# Patient Record
Sex: Female | Born: 1970
Health system: Southern US, Community
[De-identification: ages and names within clinical notes are randomized; demographics above are authoritative.]

## PROBLEM LIST (undated history)

## (undated) DIAGNOSIS — H9190 Unspecified hearing loss, unspecified ear: Secondary | ICD-10-CM

## (undated) DIAGNOSIS — D473 Essential (hemorrhagic) thrombocythemia: Secondary | ICD-10-CM

## (undated) DIAGNOSIS — R32 Unspecified urinary incontinence: Secondary | ICD-10-CM

## (undated) DIAGNOSIS — K5900 Constipation, unspecified: Secondary | ICD-10-CM

## (undated) DIAGNOSIS — K317 Polyp of stomach and duodenum: Secondary | ICD-10-CM

## (undated) DIAGNOSIS — I499 Cardiac arrhythmia, unspecified: Secondary | ICD-10-CM

## (undated) DIAGNOSIS — K219 Gastro-esophageal reflux disease without esophagitis: Secondary | ICD-10-CM

## (undated) DIAGNOSIS — N898 Other specified noninflammatory disorders of vagina: Secondary | ICD-10-CM

## (undated) DIAGNOSIS — R7989 Other specified abnormal findings of blood chemistry: Secondary | ICD-10-CM

## (undated) DIAGNOSIS — D75839 Thrombocytosis, unspecified: Secondary | ICD-10-CM

## (undated) DIAGNOSIS — J309 Allergic rhinitis, unspecified: Secondary | ICD-10-CM

## (undated) DIAGNOSIS — F909 Attention-deficit hyperactivity disorder, unspecified type: Secondary | ICD-10-CM

## (undated) DIAGNOSIS — I471 Supraventricular tachycardia, unspecified: Secondary | ICD-10-CM

## (undated) DIAGNOSIS — K6289 Other specified diseases of anus and rectum: Secondary | ICD-10-CM

## (undated) DIAGNOSIS — E0590 Thyrotoxicosis, unspecified without thyrotoxic crisis or storm: Secondary | ICD-10-CM

## (undated) DIAGNOSIS — R5382 Chronic fatigue, unspecified: Secondary | ICD-10-CM

## (undated) DIAGNOSIS — K589 Irritable bowel syndrome without diarrhea: Secondary | ICD-10-CM

## (undated) DIAGNOSIS — R112 Nausea with vomiting, unspecified: Secondary | ICD-10-CM

## (undated) DIAGNOSIS — N83201 Unspecified ovarian cyst, right side: Secondary | ICD-10-CM

## (undated) DIAGNOSIS — R109 Unspecified abdominal pain: Secondary | ICD-10-CM

## (undated) DIAGNOSIS — R51 Headache: Secondary | ICD-10-CM

## (undated) DIAGNOSIS — D126 Benign neoplasm of colon, unspecified: Secondary | ICD-10-CM

## (undated) DIAGNOSIS — K449 Diaphragmatic hernia without obstruction or gangrene: Secondary | ICD-10-CM

## (undated) DIAGNOSIS — Z9889 Other specified postprocedural states: Secondary | ICD-10-CM

## (undated) DIAGNOSIS — R000 Tachycardia, unspecified: Secondary | ICD-10-CM

## (undated) HISTORY — DX: Unspecified hearing loss, unspecified ear: H91.90

## (undated) HISTORY — DX: Other specified diseases of anus and rectum: K62.89

## (undated) HISTORY — PX: SHOULDER SURGERY: SHX246

## (undated) HISTORY — DX: Supraventricular tachycardia, unspecified: I47.10

## (undated) HISTORY — PX: OTHER SURGICAL HISTORY: SHX169

## (undated) HISTORY — DX: Unspecified urinary incontinence: R32

## (undated) HISTORY — PX: UPPER GASTROINTESTINAL ENDOSCOPY: SHX188

## (undated) HISTORY — DX: Essential (hemorrhagic) thrombocythemia: D47.3

## (undated) HISTORY — DX: Benign neoplasm of colon, unspecified: D12.6

## (undated) HISTORY — DX: Chronic fatigue, unspecified: R53.82

## (undated) HISTORY — PX: INCONTINENCE SURGERY: SHX676

## (undated) HISTORY — DX: Other specified noninflammatory disorders of vagina: N89.8

## (undated) HISTORY — DX: Constipation, unspecified: K59.00

## (undated) HISTORY — DX: Attention-deficit hyperactivity disorder, unspecified type: F90.9

## (undated) HISTORY — DX: Unspecified ovarian cyst, right side: N83.201

## (undated) HISTORY — DX: Polyp of stomach and duodenum: K31.7

## (undated) HISTORY — PX: NOSE SURGERY: SHX723

## (undated) HISTORY — DX: Supraventricular tachycardia: I47.1

## (undated) HISTORY — DX: Diaphragmatic hernia without obstruction or gangrene: K44.9

## (undated) HISTORY — PX: DIAGNOSTIC LAPAROSCOPY: SUR761

## (undated) HISTORY — DX: Irritable bowel syndrome, unspecified: K58.9

## (undated) HISTORY — PX: DILATION AND CURETTAGE OF UTERUS: SHX78

## (undated) HISTORY — DX: Unspecified abdominal pain: R10.9

## (undated) HISTORY — DX: Thrombocytosis, unspecified: D75.839

## (undated) HISTORY — DX: Allergic rhinitis, unspecified: J30.9

## (undated) HISTORY — DX: Other specified abnormal findings of blood chemistry: R79.89

## (undated) HISTORY — PX: ANTERIOR AND POSTERIOR REPAIR: SHX1172

## (undated) HISTORY — PX: WISDOM TOOTH EXTRACTION: SHX21

## (undated) SURGERY — Surgical Case
Anesthesia: *Unknown

---

## 1998-02-19 ENCOUNTER — Ambulatory Visit (HOSPITAL_BASED_OUTPATIENT_CLINIC_OR_DEPARTMENT_OTHER): Admission: RE | Admit: 1998-02-19 | Discharge: 1998-02-19 | Payer: Self-pay | Admitting: *Deleted

## 1998-08-28 ENCOUNTER — Encounter: Payer: Self-pay | Admitting: Gastroenterology

## 1998-08-28 ENCOUNTER — Ambulatory Visit (HOSPITAL_COMMUNITY): Admission: RE | Admit: 1998-08-28 | Discharge: 1998-08-28 | Payer: Self-pay | Admitting: Gastroenterology

## 1998-10-14 ENCOUNTER — Ambulatory Visit (HOSPITAL_COMMUNITY): Admission: RE | Admit: 1998-10-14 | Discharge: 1998-10-14 | Payer: Self-pay | Admitting: Gastroenterology

## 1999-06-13 ENCOUNTER — Encounter: Payer: Self-pay | Admitting: *Deleted

## 1999-06-13 ENCOUNTER — Encounter: Admission: RE | Admit: 1999-06-13 | Discharge: 1999-06-13 | Payer: Self-pay | Admitting: *Deleted

## 1999-10-22 ENCOUNTER — Encounter: Admission: RE | Admit: 1999-10-22 | Discharge: 2000-01-20 | Payer: Self-pay | Admitting: Neurology

## 1999-10-23 ENCOUNTER — Encounter: Admission: RE | Admit: 1999-10-23 | Discharge: 1999-10-23 | Payer: Self-pay | Admitting: Neurology

## 1999-10-23 ENCOUNTER — Encounter: Payer: Self-pay | Admitting: Neurology

## 2000-07-07 ENCOUNTER — Other Ambulatory Visit: Admission: RE | Admit: 2000-07-07 | Discharge: 2000-07-07 | Payer: Self-pay | Admitting: *Deleted

## 2001-09-06 ENCOUNTER — Other Ambulatory Visit: Admission: RE | Admit: 2001-09-06 | Discharge: 2001-09-06 | Payer: Self-pay | Admitting: *Deleted

## 2004-09-25 ENCOUNTER — Encounter: Admission: RE | Admit: 2004-09-25 | Discharge: 2004-09-25 | Payer: Self-pay | Admitting: General Surgery

## 2005-01-11 ENCOUNTER — Inpatient Hospital Stay (HOSPITAL_COMMUNITY): Admission: AD | Admit: 2005-01-11 | Discharge: 2005-01-11 | Payer: Self-pay | Admitting: Obstetrics and Gynecology

## 2005-07-18 ENCOUNTER — Inpatient Hospital Stay (HOSPITAL_COMMUNITY): Admission: AD | Admit: 2005-07-18 | Discharge: 2005-07-18 | Payer: Self-pay | Admitting: *Deleted

## 2005-07-18 ENCOUNTER — Encounter (INDEPENDENT_AMBULATORY_CARE_PROVIDER_SITE_OTHER): Payer: Self-pay | Admitting: Specialist

## 2005-10-30 ENCOUNTER — Inpatient Hospital Stay (HOSPITAL_COMMUNITY): Admission: AD | Admit: 2005-10-30 | Discharge: 2005-11-02 | Payer: Self-pay | Admitting: Obstetrics and Gynecology

## 2005-11-03 ENCOUNTER — Encounter: Admission: RE | Admit: 2005-11-03 | Discharge: 2005-12-03 | Payer: Self-pay | Admitting: Obstetrics and Gynecology

## 2006-01-26 ENCOUNTER — Encounter: Admission: RE | Admit: 2006-01-26 | Discharge: 2006-03-25 | Payer: Self-pay | Admitting: Obstetrics and Gynecology

## 2006-04-19 ENCOUNTER — Ambulatory Visit (HOSPITAL_COMMUNITY): Admission: RE | Admit: 2006-04-19 | Discharge: 2006-04-19 | Payer: Self-pay | Admitting: Neurology

## 2006-09-03 ENCOUNTER — Ambulatory Visit (HOSPITAL_COMMUNITY): Admission: RE | Admit: 2006-09-03 | Discharge: 2006-09-03 | Payer: Self-pay | Admitting: Neurology

## 2006-09-15 ENCOUNTER — Ambulatory Visit (HOSPITAL_COMMUNITY): Admission: RE | Admit: 2006-09-15 | Discharge: 2006-09-15 | Payer: Self-pay | Admitting: Neurology

## 2006-12-17 ENCOUNTER — Encounter: Payer: Self-pay | Admitting: Urology

## 2006-12-17 ENCOUNTER — Ambulatory Visit (HOSPITAL_COMMUNITY): Admission: RE | Admit: 2006-12-17 | Discharge: 2006-12-18 | Payer: Self-pay | Admitting: Urology

## 2007-05-17 ENCOUNTER — Encounter: Admission: RE | Admit: 2007-05-17 | Discharge: 2007-05-17 | Payer: Self-pay | Admitting: Orthopedic Surgery

## 2007-05-25 ENCOUNTER — Ambulatory Visit (HOSPITAL_COMMUNITY): Admission: RE | Admit: 2007-05-25 | Discharge: 2007-05-25 | Payer: Self-pay | Admitting: Otolaryngology

## 2007-05-27 ENCOUNTER — Ambulatory Visit (HOSPITAL_BASED_OUTPATIENT_CLINIC_OR_DEPARTMENT_OTHER): Admission: RE | Admit: 2007-05-27 | Discharge: 2007-05-27 | Payer: Self-pay | Admitting: Otolaryngology

## 2007-06-09 ENCOUNTER — Encounter: Admission: RE | Admit: 2007-06-09 | Discharge: 2007-06-09 | Payer: Self-pay | Admitting: Otolaryngology

## 2007-08-26 ENCOUNTER — Encounter: Admission: RE | Admit: 2007-08-26 | Discharge: 2007-08-26 | Payer: Self-pay | Admitting: Surgery

## 2007-08-30 ENCOUNTER — Encounter: Admission: RE | Admit: 2007-08-30 | Discharge: 2007-08-30 | Payer: Self-pay | Admitting: Otolaryngology

## 2007-09-10 ENCOUNTER — Ambulatory Visit (HOSPITAL_COMMUNITY): Admission: RE | Admit: 2007-09-10 | Discharge: 2007-09-10 | Payer: Self-pay | Admitting: Neurology

## 2008-02-29 ENCOUNTER — Encounter: Admission: RE | Admit: 2008-02-29 | Discharge: 2008-05-22 | Payer: Self-pay | Admitting: Orthopedic Surgery

## 2008-09-16 ENCOUNTER — Ambulatory Visit (HOSPITAL_COMMUNITY): Admission: RE | Admit: 2008-09-16 | Discharge: 2008-09-16 | Payer: Self-pay | Admitting: Ophthalmology

## 2009-08-19 ENCOUNTER — Ambulatory Visit (HOSPITAL_COMMUNITY): Admission: RE | Admit: 2009-08-19 | Discharge: 2009-08-19 | Payer: Self-pay | Admitting: Gastroenterology

## 2009-08-21 ENCOUNTER — Encounter: Admission: RE | Admit: 2009-08-21 | Discharge: 2009-08-21 | Payer: Self-pay | Admitting: Gastroenterology

## 2010-04-03 ENCOUNTER — Encounter: Admission: RE | Admit: 2010-04-03 | Discharge: 2010-04-03 | Payer: Self-pay | Admitting: Otolaryngology

## 2010-08-16 ENCOUNTER — Encounter: Payer: Self-pay | Admitting: Surgery

## 2010-08-17 ENCOUNTER — Encounter: Payer: Self-pay | Admitting: Neurology

## 2010-09-25 ENCOUNTER — Other Ambulatory Visit (HOSPITAL_COMMUNITY): Payer: Self-pay | Admitting: Sports Medicine

## 2010-09-25 DIAGNOSIS — M25512 Pain in left shoulder: Secondary | ICD-10-CM

## 2010-10-02 ENCOUNTER — Ambulatory Visit (HOSPITAL_COMMUNITY)
Admission: RE | Admit: 2010-10-02 | Discharge: 2010-10-02 | Disposition: A | Discharge status: Home / Self Care | Payer: Commercial Managed Care - PPO | Source: Ambulatory Visit | Attending: Sports Medicine | Admitting: Sports Medicine

## 2010-10-02 ENCOUNTER — Other Ambulatory Visit (HOSPITAL_COMMUNITY): Payer: Self-pay

## 2010-10-02 ENCOUNTER — Other Ambulatory Visit (HOSPITAL_COMMUNITY): Payer: Self-pay | Admitting: Sports Medicine

## 2010-10-02 DIAGNOSIS — S43439A Superior glenoid labrum lesion of unspecified shoulder, initial encounter: Secondary | ICD-10-CM

## 2010-10-02 DIAGNOSIS — M25519 Pain in unspecified shoulder: Secondary | ICD-10-CM | POA: Insufficient documentation

## 2010-10-02 DIAGNOSIS — M25512 Pain in left shoulder: Secondary | ICD-10-CM

## 2010-10-02 MED ORDER — IOHEXOL 300 MG/ML  SOLN
12.0000 mL | Freq: Once | INTRAMUSCULAR | Status: AC | PRN
  Administered 2010-10-02: 12 mL via INTRAVENOUS

## 2010-10-02 MED ORDER — GADOBENATE DIMEGLUMINE 529 MG/ML IV SOLN
0.1000 mL | Freq: Once | INTRAVENOUS | Status: AC | PRN
  Administered 2010-10-02: 0.1 mL via INTRAVENOUS

## 2010-10-23 ENCOUNTER — Ambulatory Visit (HOSPITAL_BASED_OUTPATIENT_CLINIC_OR_DEPARTMENT_OTHER)
Admission: RE | Admit: 2010-10-23 | Discharge: 2010-10-23 | Disposition: A | Discharge status: Home / Self Care | Payer: 59 | Source: Ambulatory Visit | Attending: Orthopedic Surgery | Admitting: Orthopedic Surgery

## 2010-10-23 DIAGNOSIS — M659 Unspecified synovitis and tenosynovitis, unspecified site: Secondary | ICD-10-CM | POA: Insufficient documentation

## 2010-10-23 DIAGNOSIS — M67919 Unspecified disorder of synovium and tendon, unspecified shoulder: Secondary | ICD-10-CM | POA: Insufficient documentation

## 2010-10-23 DIAGNOSIS — M899 Disorder of bone, unspecified: Secondary | ICD-10-CM | POA: Insufficient documentation

## 2010-10-23 DIAGNOSIS — M25819 Other specified joint disorders, unspecified shoulder: Secondary | ICD-10-CM | POA: Insufficient documentation

## 2010-10-23 DIAGNOSIS — M7500 Adhesive capsulitis of unspecified shoulder: Secondary | ICD-10-CM | POA: Insufficient documentation

## 2010-10-23 DIAGNOSIS — M719 Bursopathy, unspecified: Secondary | ICD-10-CM | POA: Insufficient documentation

## 2010-10-23 DIAGNOSIS — Z01812 Encounter for preprocedural laboratory examination: Secondary | ICD-10-CM | POA: Insufficient documentation

## 2010-10-23 LAB — POCT HEMOGLOBIN-HEMACUE: Hemoglobin: 14 g/dL (ref 12.0–15.0)

## 2010-10-30 NOTE — Op Note (Signed)
  NAME:  [name], [name]                 ACCOUNT NO.:  [id number]  MEDICAL RECORD NO.:  [id number]           PATIENT TYPE:  LOCATION:                                 FACILITY:  PHYSICIAN:  Loreta Ave, M.D. DATE OF BIRTH:  1970/09/02  DATE OF PROCEDURE:  10/23/2010 DATE OF DISCHARGE:                              OPERATIVE REPORT   PREOPERATIVE DIAGNOSES:  Left shoulder subacromial impingement, distal clavicle osteolysis, reactive bursitis.  POSTOPERATIVE DIAGNOSES:  Left shoulder subacromial impingement, distal clavicle osteolysis, reactive bursitis with secondary moderate adhesive capsulitis with anterior and extra-articular adhesions and synovitis.  PROCEDURES: 1. Left shoulder manipulation under anesthesia. 2. Arthroscopy with lysis of anterior and extra-articular adhesions     and synovitis. 3. Bursectomy, acromioplasty, CA ligament release. 4. Excision of distal clavicle.  SURGEON:  Loreta Ave, MD  ASSISTANT:  Genene Churn. Barry Dienes, [place], present throughout the entire case and necessary for timely completion of procedure.  ANESTHESIA:  General.  BLOOD LOSS:  Minimal.  SPECIMENS:  None.  CULTURES:  None.  COMPLICATIONS:  None.  DRESSINGS:  Soft compressive with sling.  PROCEDURE IN DETAIL:  The patient was brought to the operating room and after adequate anesthesia had been obtained, shoulder examined.  Loss of 20-25% of motion in all planes from adhesions.  Gently manipulated to break up all adhesions achieving full motion and stable shoulder. Placed in beach-chair position on the shoulder positioner, prepped and draped in usual sterile fashion.  Three portals anterior and posterolateral.  Arthroscope was introduced, shoulder distended and inspected.  Intra-articular adhesions debrided, synovitis debrided. Articular cartilage, labrum, biceps tendon, biceps anchor all looked good.  Undersurface of cuff looked good.  Cannula redirected subacromially.  Reactive  bursitis debrided.  Roughening on the top of the cuff from impingement and a type 3 acromion, but no full-thickness tears.  Bursa resected, cuff debrided.  CA ligament release. Acromioplasty to a type 1 acromion with shaver and high-speed bur. Distal clavicle exposed grade 3 and 4 changes with spurs.  Periarticular spurs and lateral centimeter of clavicle resected.  Adequacy of decompression and debridement confirmed viewing from all portals.  Instruments and fluid removed. Portals, shoulder, bursa injected with Marcaine.  Portals closed with 4- 0 nylon.  Sterile compressive dressing applied.  Anesthesia reversed. Brought to recovery room.  Tolerated the surgery well.  No complications.     Loreta Ave, M.D.     DFM/MEDQ  D:  10/23/2010  T:  10/24/2010  Job:  [id number]  Electronically Signed by Mckinley Jewel M.D. on 10/30/2010 02:09:31 PM

## 2010-11-27 ENCOUNTER — Ambulatory Visit (HOSPITAL_BASED_OUTPATIENT_CLINIC_OR_DEPARTMENT_OTHER)
Admission: RE | Admit: 2010-11-27 | Discharge: 2010-11-27 | Disposition: A | Discharge status: Home / Self Care | Payer: Commercial Managed Care - PPO | Source: Ambulatory Visit | Attending: Orthopedic Surgery | Admitting: Orthopedic Surgery

## 2010-11-27 DIAGNOSIS — G43909 Migraine, unspecified, not intractable, without status migrainosus: Secondary | ICD-10-CM | POA: Insufficient documentation

## 2010-11-27 DIAGNOSIS — Z01812 Encounter for preprocedural laboratory examination: Secondary | ICD-10-CM | POA: Insufficient documentation

## 2010-11-27 DIAGNOSIS — M24619 Ankylosis, unspecified shoulder: Secondary | ICD-10-CM | POA: Insufficient documentation

## 2010-11-27 DIAGNOSIS — M7500 Adhesive capsulitis of unspecified shoulder: Secondary | ICD-10-CM | POA: Insufficient documentation

## 2010-11-27 LAB — POCT HEMOGLOBIN-HEMACUE: Hemoglobin: 13.5 g/dL (ref 12.0–15.0)

## 2010-12-02 NOTE — Op Note (Signed)
  NAME:  ESRA, FRANKOWSKI                 ACCOUNT NO.:  [id number]  MEDICAL RECORD NO.:  [id number]           PATIENT TYPE:  LOCATION:                                 FACILITY:  PHYSICIAN:  Loreta Ave, M.D. DATE OF BIRTH:  [birth year]  DATE OF PROCEDURE:  11/27/2010 DATE OF DISCHARGE:                              OPERATIVE REPORT   PREOPERATIVE DIAGNOSIS:  Residual arthrofibrosis, left shoulder, after previous decompression and manipulation for adhesive capsulitis.  POSTOPERATIVE DIAGNOSIS:  Residual arthrofibrosis, left shoulder, after previous decompression and manipulation for adhesive capsulitis.  PROCEDURE:  Left shoulder exam, manipulation under anesthesia with subacromial injection of Depo-Medrol and Marcaine.  SURGEON:  Loreta Ave, MD  ANESTHESIA:  General.  PROCEDURE:  The patient was brought to the operating room, placed on the operating table in supine position.  After adequate anesthesia had been obtained, shoulder was examined.  She had regained fairly good forward flexion and abduction, almost full.  It was her rotation that was most limited.  About 30 of external and virtually no internal rotation.  That was then manipulated breaking up obvious subacromial adhesions achieving full motion, stable shoulder with nice, smooth gliding motion at completion.  Injected subacromially with Depo-Medrol and Marcaine under sterile technique.  Anesthesia reversed.  Brought to the recovery room. Tolerated the surgery well.  No complications.     Loreta Ave, M.D.     DFM/MEDQ  D:  11/27/2010  T:  11/28/2010  Job:  [id number]  Electronically Signed by Mckinley Jewel M.D. on 12/02/2010 01:32:33 PM

## 2010-12-09 NOTE — Op Note (Signed)
NAME:  COXWandra, [name]                 ACCOUNT NO.:  [id number]   MEDICAL RECORD NO.:  [id number]          PATIENT TYPE:  AMB   LOCATION:  DAY                          FACILITY:  [facility]   PHYSICIAN:  Martina Sinner, MD DATE OF BIRTH:  [birth year]   DATE OF PROCEDURE:  12/17/2006  DATE OF DISCHARGE:                               OPERATIVE REPORT   PREOPERATIVE DIAGNOSES:  1. Stress urinary incontinence.  2. Cystocele.  3. Rectocele.   POSTOPERATIVE DIAGNOSES:  1. Stress urinary incontinence.  2. Cystocele.  3. Rectocele.   SURGERY:  Sling cystourethropexy [facility]) plus cystocele repair plus  graft plus rectocele repair plus graft.   SURGEON:  Martina Sinner, MD.   ASSISTANT:  Nursing staff.   [name] has a symptomatic cystocele and rectocele.  She has stress  incontinence.  She has had a neurologic injury to the pelvic floor.   The patient was prepped and draped in usual fashion.  Extra care was  taken in leg positioning to minimize the risk of compartment syndrome,  neuropathy and DVT.  She was given preoperative antibiotics.  Preoperative laboratory work was normal.   I could identify a grade II cystocele and grade II rectocele at the  beginning of the case.  I could identify the cervix and vaginal cuff  that was well supported.   Between two Allis clamps, I made a T-shaped anterior vaginal wall  incision after instilling approximately 25 mL of 1% lidocaine mixture.  I dissected the vaginal wall from the pubocervical fascia to the pelvic  sidewall bilaterally.  I dissected to the white line bilaterally.  The  dissection went very well.  I identified the levator muscles  bilaterally.  I had to oversew a bleeder at 5 o'clock in the  pubocervical fascia with a few 3-0 Vicryl sutures.   I did an anterior repair in one layer with running 2-0 Vicryl.  In a  four-corner fashion using 2-0 Vicryl, I sewed it tailored about a 3 x 6  cm dermal graft.  Prior to sewing the  graft, I cystoscoped the patient.  There was no distortion of the anatomy.  There is efflux of indigo  carmine from both ureteral orifices.   After placing the four 2-0 Vicryl sutures in a four-corner fashion, but  before I actually sewed in the graft I did the SPARC sling.  I  identified the urethrovesical angle bilaterally.  I made two 1.5 cm  incisions 1.57 cm lateral to the midline one fingerbreadth above the  symphysis pubis.  I passed the [facility] needle on top of and along the back  of the symphysis pubis under the pulp of my left index finger  bilaterally.  I then cystoscoped the patient and again there was efflux  of indigo carmine from both ureteral orifices.  There was no injury to  the bladder or urethra.  There was no indentation on the bladder when I  would move the needle passer.  The above maneuver was done with the  bladder empty.  With the bladder emptied, I attached the [facility]  sling and  brought it up with the sheath in place through the retropubic space.  I  tensioned the sheath over the fat part of the Kelly clamp which is my  usual.  I then cut below the blue dots and removed the sheath  bilaterally.  The sling was in excellent position.  There was  hypermobility in the midline as well as laterally.  It laid in nice and  flat in a loose fashion over the mid urethra.  There was a little bit of  bleeding from the patient's right urethrovesical angle that was minimal.  I trimmed 4-5 mm from the left vaginal wall on the right and a few  millimeters on the left.  I closed the fairly thin vaginal wall with  running 2-0 Vicryl on a CT wand.  The closure was thicker overlying the  synthetic sling.  Before actually closing, I did sew in the 3 x 6 cm  graft which laid in nicely over the cystocele repair.  After closing the  anterior vaginal wall, the anterior repair looked very good.  She did  have some ongoing oozing from the right urethrovesical angle but there  was no other  obvious site of bleeding.  I did tried to move along as  readily as possible because of the posterior repair still had to be  performed.   I then went on to do the posterior repair.  I placed two Allis clamps at  the introitus at the level of the hymenal ring.  I removed a small  triangle of skin.  I then made a long midline incision using Allis  clamps through the thin vaginal wall mucosa.  I dissected the vaginal  wall from the rectovaginal fascia through the pelvic sidewall.  I went  to about 2 cm from the vaginal cuff were she actually had good support.  She had  quite a narrow pelvis and long pelvis and I did not want to  cause a lot of bleeding high near the apex.   After mobilizing the vaginal wall mucosa from the rectovaginal fascia, I  did a rectal examination and could easily identify a large central  defect.  I did a two-layer closure not under tension with 2-0 Vicryl  imbricating the rectocele.  I did a number of rectal examinations and  was happy with the tension and the fact there was no suture within the  rectum.  Because the patient does a lot of straining for bowel movements  and did have moderate weakness of the rectovaginal fascia, I laid in a 4  x 7 graft tailored to approximately 6 x 3 cm.  It was sewn gently with 2-  0 Vicryl on UR-6 needles to the rectovaginal fascia at the introitus and  two near the apex.  I left a lot of graft that I could trap door over  any apical loss of support.   I trimmed approximately 2 mm of posterior vaginal wall mucosa and closed  with running 2-0 Vicryl.  Hemostasis posteriorly was good and one area  had be oversewn on the right lateral sidewall with a 3-0 Vicryl.   When I checked at the end of the case, she had good vaginal length and  no narrowing.  She did not have a lot of bleeding but it was used  oozy.  I placed in a large Estrace soaked vaginal pack and a smaller one just so I would have good compression throughout the  vagina.  I was  careful not to tear my incisions.  I placed a pack in firmly to control  any bleeding.  Her urine output was excellent and catheter was draining  blue dye at the end of the case.  Leg position was good.   The two small incisions in the suprapubic area were closed with 4-0  Vicryl and Dermabond.   Ms. Spiker had a successful A&P repair with graft anteriorly and  posteriorly.  She will be followed as per protocol.  Hopefully she will  be up avoid efficiently and this surgery will reach her treatment goal.           ______________________________  Martina Sinner, MD  Electronically Signed     SAM/MEDQ  D:  12/17/2006  T:  12/17/2006  Job:  [id number]

## 2010-12-09 NOTE — Op Note (Signed)
NAME:  [name], [name]                 ACCOUNT NO.:  [id number]   MEDICAL RECORD NO.:  [id number]          PATIENT TYPE:  AMB   LOCATION:  DSC                          FACILITY:  [facility]   PHYSICIAN:  Newman Pies, MD            DATE OF BIRTH:  Feb 22, 1971   DATE OF PROCEDURE:  05/27/2007  DATE OF DISCHARGE:                               OPERATIVE REPORT   SURGEON:  Newman Pies, M.D.   PREOPERATIVE DIAGNOSES:  1. Chronic nasal obstruction.  2. Bilateral inferior turbinate hypertrophy.  3. Chronic rhinitis.   POSTOPERATIVE DIAGNOSES:  1. Chronic nasal obstruction.  2. Bilateral inferior turbinate hypertrophy.  3. Chronic rhinitis.   PROCEDURE PERFORMED:  Bilateral partial inferior turbinate resection.   ANESTHESIA:  General endotracheal tube anesthesia.   COMPLICATIONS:  None.   ESTIMATED BLOOD LOSS:  10 mL.   INDICATIONS FOR PROCEDURE:  [name] is a 40 year old white female  with a history of chronic nasal obstruction and chronic rhinitis.  On  examination, the patient was noted to have significant bilateral  inferior turbinate hypertrophy.  The patient previously underwent  septoplasty to correct her deviated septum.  She was treated with  steroid nasal spray, nasal saline irrigation, over-the-counter  decongestant and antihistamine.  However, she continues to be  symptomatic.  Based on that finding, the decision was made for the  patient to undergo bilateral partial inferior turbinate resection.  The  risks, benefits, alternatives, and details of the procedure were  discussed with the patient.  Questions were invited and answered.  She  would like to proceed with the procedure.   DESCRIPTION:  The patient was taken to the operating room and placed  supine on the operating table.  General endotracheal tube anesthesia was  administered by the anesthesiologist.  Preoperative IV antibiotic was  given.  The patient was then positioned and prepped and draped in a  standard fashion for  nasal surgery.  Pledgets soaked with Afrin were  placed in both nasal cavities for decongestant.  The pledget was  subsequently removed.  Attention was first focused in the left nasal  cavity.  The inferior turbinate was noted to be hypertrophied.  The  inferior one half of the inferior turbinates was clamped with the  straight hemostat.  The hemostat was subsequently released, and the  inferior one half of the turbinates was resected with cross-cutting  scissors.  The same procedure was then repeated on the right side  without exception.  Hemostasis was achieved with suction electrocautery.  The care of the patient was turned over to the anesthesiologist.  The  patient was awakened from anesthesia without difficulty.  She was  extubated and transferred to the recovery room in good condition.   OPERATIVE FINDINGS:  Bilateral inferior turbinate hypertrophy.   SPECIMENS REMOVED:  None.   FOLLOW-UP CARE:  The patient will be observed in the postanesthetic care  unit.  She will be discharged home once she is awake and alert.  She  will be placed on Percocet 1-2 tablets p.o. q.4-6h. p.r.n. pain and  Keflex 500 mg p.o. q.i.d. for 5 days.  She will follow-up in my office  in approximately 1 week.      Newman Pies, MD  Electronically Signed     ST/MEDQ  D:  05/27/2007  T:  05/27/2007  Job:  [id number]

## 2010-12-12 NOTE — Consult Note (Signed)
NAME:  [name], [name]                 ACCOUNT NO.:  [id number]   MEDICAL RECORD NO.:  [id number]          PATIENT TYPE:  MAT   LOCATION:  MATC                          FACILITY:  [facility]   PHYSICIAN:  Lenoard Aden, M.D.DATE OF BIRTH:  [birth year]   DATE OF CONSULTATION:  DATE OF DISCHARGE:                                   CONSULTATION   CHIEF COMPLAINT:  Increased frequency of contractions, questionable leakage  of fluid.   She is a 40 year old white female, G1, P0, EDD November 03, 2005 at 39 plus  weeks who presents with increasing frequency of contraction, questionable  leakage of fluids.   MEDICATIONS:  Prenatal vitamins, Zantac and Toprol.   SOCIAL HISTORY:  She is a nonsmoker, nondrinker, denies __________.   FAMILY HISTORY:  Anemia, emphysema and stroke.   PERSONAL HISTORY:  Hyperthyroidism and acid reflux.  During pregnancy was  diagnosed with supraventricular tachycardia and started on Toprol.   PHYSICAL EXAMINATION:  GENERAL:  She is a slightly uncomfortable appearing  white female in no acute distress.  HEENT:  Normal.  LUNGS:  Clear.  HEART:  Regular rate and rhythm.  ABDOMEN:  Soft, gravid and nontender. Estimated fetal weight 6.5 pounds.  Cervix is 2-3 cm, 70% effaced, vertex, -1. Fern  and Nitrazine are negative,  sterile speculum exam is negative. Membranes are palpable. Fetal heart rate  is regular and reactive. Contractions are every 3-7 minutes lasting 40-60  seconds.   IMPRESSION:  1.  39 week OB.  2.  Supraventricular tachycardia on Toprol.  3.  Gastroesophageal reflux.  4.  Prodromal labor pattern and no evidence of spontaneous rupture of      membranes.   PLAN:  Proceed with fetal monitoring, ambulate and recheck. Possible  admission pending cervical change. If questionable concerns regarding  continued leakage of fluid are noted will perform ultrasound to check AFI.      Lenoard Aden, M.D.  Electronically Signed    RJT/MEDQ  D:   10/30/2005  T:  10/30/2005  Job:  [id number]

## 2011-05-06 LAB — POCT HEMOGLOBIN-HEMACUE
Hemoglobin: 14
Operator id: 116011

## 2011-05-07 ENCOUNTER — Other Ambulatory Visit: Payer: Self-pay | Admitting: Obstetrics and Gynecology

## 2011-05-18 ENCOUNTER — Encounter (HOSPITAL_COMMUNITY): Payer: Self-pay | Admitting: *Deleted

## 2011-05-18 ENCOUNTER — Other Ambulatory Visit (HOSPITAL_COMMUNITY): Payer: Self-pay | Admitting: Gastroenterology

## 2011-05-19 ENCOUNTER — Ambulatory Visit (HOSPITAL_COMMUNITY)
Admission: RE | Admit: 2011-05-19 | Discharge: 2011-05-19 | Disposition: A | Discharge status: Home / Self Care | Payer: 59 | Source: Ambulatory Visit | Attending: Gastroenterology | Admitting: Gastroenterology

## 2011-05-19 DIAGNOSIS — R109 Unspecified abdominal pain: Secondary | ICD-10-CM | POA: Insufficient documentation

## 2011-05-28 ENCOUNTER — Encounter (HOSPITAL_COMMUNITY): Payer: Self-pay

## 2011-05-28 HISTORY — PX: ENDOMETRIAL ABLATION: SHX621

## 2011-05-29 NOTE — Patient Instructions (Addendum)
   Your procedure is scheduled ZO:XWRUEA November 16th  Enter through the Main Entrance of [facility] at:6am Pick up the phone at the desk and dial [phone] and inform us of your arrival.  Please call this number if you have any problems the morning of surgery: [phone]  Remember: Do not eat food after midnight:Thursday Do not drink clear liquids after:midnight Thursday Take these medicines the morning of surgery with a SIP OF WATER: None  Do not wear jewelry, make-up, or FINGER nail polish Do not wear lotions, powders, or perfumes.  You may not  wear deodorant. Do not shave 48 hours prior to surgery. Do not bring valuables to the hospital.  Patients discharged on the day of surgery will not be allowed to drive home.   Name and phone number of your driver: Juliene Pina  cell [phone]   Remember to use your hibiclens as instructed.Please shower with 1/2 bottle the evening before your surgery and the other 1/2 bottle the morning of surgery.

## 2011-06-02 ENCOUNTER — Other Ambulatory Visit: Payer: Self-pay

## 2011-06-02 ENCOUNTER — Encounter (HOSPITAL_COMMUNITY): Payer: Self-pay

## 2011-06-02 ENCOUNTER — Encounter (HOSPITAL_COMMUNITY)
Admission: RE | Admit: 2011-06-02 | Discharge: 2011-06-02 | Disposition: A | Discharge status: Home / Self Care | Payer: 59 | Source: Ambulatory Visit | Attending: Obstetrics and Gynecology | Admitting: Obstetrics and Gynecology

## 2011-06-02 HISTORY — DX: Tachycardia, unspecified: R00.0

## 2011-06-02 LAB — COMPREHENSIVE METABOLIC PANEL
ALT: 8 U/L (ref 0–35)
AST: 12 U/L (ref 0–37)
Albumin: 4 g/dL (ref 3.5–5.2)
Alkaline Phosphatase: 57 U/L (ref 39–117)
BUN: 12 mg/dL (ref 6–23)
CO2: 27 mEq/L (ref 19–32)
Calcium: 9.7 mg/dL (ref 8.4–10.5)
Chloride: 102 mEq/L (ref 96–112)
Creatinine, Ser: 0.65 mg/dL (ref 0.50–1.10)
GFR calc Af Amer: >90 mL/min (ref >90)
GFR calc non Af Amer: >90 mL/min (ref >90)
Glucose, Bld: 100 mg/dL — ABNORMAL HIGH (ref 70–99)
Potassium: 4.1 mEq/L (ref 3.5–5.1)
Sodium: 136 mEq/L (ref 135–145)
Total Bilirubin: 0.3 mg/dL (ref 0.3–1.2)
Total Protein: 7.1 g/dL (ref 6.0–8.3)

## 2011-06-02 LAB — CBC
HCT: 40.6 % (ref 36.0–46.0)
Hemoglobin: 13.7 g/dL (ref 12.0–15.0)
MCH: 31.8 pg (ref 26.0–34.0)
MCHC: 33.7 g/dL (ref 30.0–36.0)
MCV: 94.2 fL (ref 78.0–100.0)
Platelets: 335 10*3/uL (ref 150–400)
RBC: 4.31 MIL/uL (ref 3.87–5.11)
RDW: 13.4 % (ref 11.5–15.5)
WBC: 5.4 10*3/uL (ref 4.0–10.5)

## 2011-06-02 LAB — URINALYSIS, ROUTINE W REFLEX MICROSCOPIC
Bilirubin Urine: NEGATIVE
Glucose, UA: NEGATIVE mg/dL
Ketones, ur: NEGATIVE mg/dL
Leukocytes, UA: NEGATIVE
Nitrite: NEGATIVE
Protein, ur: NEGATIVE mg/dL
Specific Gravity, Urine: <1.005 — ABNORMAL LOW (ref 1.005–1.030)
Urobilinogen, UA: 0.2 mg/dL (ref 0.0–1.0)
pH: 6.5 (ref 5.0–8.0)

## 2011-06-02 LAB — URINE MICROSCOPIC-ADD ON

## 2011-06-02 LAB — APTT: aPTT: 33 seconds (ref 24–37)

## 2011-06-02 LAB — SURGICAL PCR SCREEN
MRSA, PCR: NEGATIVE
Staphylococcus aureus: NEGATIVE

## 2011-06-02 LAB — PROTIME-INR
INR: 0.91 (ref 0.00–1.49)
Prothrombin Time: 12.5 seconds (ref 11.6–15.2)

## 2011-06-05 ENCOUNTER — Other Ambulatory Visit (HOSPITAL_COMMUNITY): Payer: Self-pay | Admitting: Gastroenterology

## 2011-06-05 DIAGNOSIS — R109 Unspecified abdominal pain: Secondary | ICD-10-CM

## 2011-06-09 ENCOUNTER — Other Ambulatory Visit (HOSPITAL_COMMUNITY): Payer: Self-pay | Admitting: Gastroenterology

## 2011-06-09 ENCOUNTER — Ambulatory Visit (HOSPITAL_COMMUNITY)
Admission: RE | Admit: 2011-06-09 | Discharge: 2011-06-09 | Disposition: A | Discharge status: Home / Self Care | Payer: 59 | Source: Ambulatory Visit | Attending: Gastroenterology | Admitting: Gastroenterology

## 2011-06-09 DIAGNOSIS — R109 Unspecified abdominal pain: Secondary | ICD-10-CM | POA: Insufficient documentation

## 2011-06-09 MED ORDER — SINCALIDE 5 MCG IJ SOLR
0.0200 ug/kg | Freq: Once | INTRAMUSCULAR | Status: AC
  Administered 2011-06-09: 1.08 ug via INTRAVENOUS

## 2011-06-09 MED ORDER — TECHNETIUM TC 99M MEBROFENIN IV KIT
5.0000 | PACK | Freq: Once | INTRAVENOUS | Status: AC | PRN
  Administered 2011-06-09: 5 via INTRAVENOUS

## 2011-06-09 MED ORDER — SINCALIDE 5 MCG IJ SOLR
INTRAMUSCULAR | Status: AC
  Administered 2011-06-09: 1.08 ug via INTRAVENOUS
  Filled 2011-06-09: qty 5

## 2011-06-10 ENCOUNTER — Other Ambulatory Visit (HOSPITAL_COMMUNITY): Payer: 59

## 2011-06-11 ENCOUNTER — Ambulatory Visit (HOSPITAL_COMMUNITY)
Admission: RE | Admit: 2011-06-11 | Discharge: 2011-06-11 | Disposition: A | Discharge status: Home / Self Care | Payer: 59 | Source: Ambulatory Visit | Attending: Gastroenterology | Admitting: Gastroenterology

## 2011-06-11 DIAGNOSIS — R1032 Left lower quadrant pain: Secondary | ICD-10-CM | POA: Insufficient documentation

## 2011-06-11 DIAGNOSIS — N83209 Unspecified ovarian cyst, unspecified side: Secondary | ICD-10-CM | POA: Insufficient documentation

## 2011-06-11 DIAGNOSIS — R1013 Epigastric pain: Secondary | ICD-10-CM | POA: Insufficient documentation

## 2011-06-11 MED ORDER — IOHEXOL 300 MG/ML  SOLN
80.0000 mL | Freq: Once | INTRAMUSCULAR | Status: AC | PRN
  Administered 2011-06-11: 80 mL via INTRAVENOUS

## 2011-06-12 ENCOUNTER — Other Ambulatory Visit: Payer: Self-pay | Admitting: Obstetrics and Gynecology

## 2011-06-12 ENCOUNTER — Encounter (HOSPITAL_COMMUNITY): Payer: Self-pay | Admitting: Anesthesiology

## 2011-06-12 ENCOUNTER — Encounter (HOSPITAL_COMMUNITY): Payer: Self-pay | Admitting: *Deleted

## 2011-06-12 ENCOUNTER — Ambulatory Visit (HOSPITAL_COMMUNITY)
Admission: RE | Admit: 2011-06-12 | Discharge: 2011-06-12 | Disposition: A | Discharge status: Home / Self Care | Payer: 59 | Source: Ambulatory Visit | Attending: Obstetrics and Gynecology | Admitting: Obstetrics and Gynecology

## 2011-06-12 ENCOUNTER — Ambulatory Visit (HOSPITAL_COMMUNITY): Payer: 59 | Admitting: Anesthesiology

## 2011-06-12 ENCOUNTER — Encounter (HOSPITAL_COMMUNITY)
Admission: RE | Disposition: A | Discharge status: Home / Self Care | Payer: Self-pay | Source: Ambulatory Visit | Attending: Obstetrics and Gynecology

## 2011-06-12 DIAGNOSIS — N83209 Unspecified ovarian cyst, unspecified side: Secondary | ICD-10-CM | POA: Insufficient documentation

## 2011-06-12 DIAGNOSIS — N946 Dysmenorrhea, unspecified: Secondary | ICD-10-CM | POA: Insufficient documentation

## 2011-06-12 DIAGNOSIS — N949 Unspecified condition associated with female genital organs and menstrual cycle: Secondary | ICD-10-CM | POA: Insufficient documentation

## 2011-06-12 DIAGNOSIS — R1032 Left lower quadrant pain: Secondary | ICD-10-CM | POA: Insufficient documentation

## 2011-06-12 DIAGNOSIS — Z30432 Encounter for removal of intrauterine contraceptive device: Secondary | ICD-10-CM | POA: Insufficient documentation

## 2011-06-12 HISTORY — DX: Gastro-esophageal reflux disease without esophagitis: K21.9

## 2011-06-12 HISTORY — DX: Cardiac arrhythmia, unspecified: I49.9

## 2011-06-12 HISTORY — DX: Other specified postprocedural states: Z98.890

## 2011-06-12 HISTORY — DX: Headache: R51

## 2011-06-12 HISTORY — DX: Other specified postprocedural states: R11.2

## 2011-06-12 HISTORY — DX: Thyrotoxicosis, unspecified without thyrotoxic crisis or storm: E05.90

## 2011-06-12 HISTORY — PX: LAPAROSCOPY: SHX197

## 2011-06-12 SURGERY — DILATATION & CURETTAGE/HYSTEROSCOPY WITH NOVASURE ABLATION
Anesthesia: General | Site: Vagina | Wound class: Clean Contaminated

## 2011-06-12 MED ORDER — ROCURONIUM BROMIDE 50 MG/5ML IV SOLN
INTRAVENOUS | Status: AC
  Filled 2011-06-12: qty 1

## 2011-06-12 MED ORDER — CEFAZOLIN SODIUM 1-5 GM-% IV SOLN
INTRAVENOUS | Status: AC
  Filled 2011-06-12: qty 50

## 2011-06-12 MED ORDER — MIDAZOLAM HCL 5 MG/5ML IJ SOLN
INTRAMUSCULAR | Status: DC | PRN
  Administered 2011-06-12: 2 mg via INTRAVENOUS

## 2011-06-12 MED ORDER — GLYCOPYRROLATE 0.2 MG/ML IJ SOLN
INTRAMUSCULAR | Status: DC | PRN
  Administered 2011-06-12: .6 mg via INTRAVENOUS

## 2011-06-12 MED ORDER — HYDROMORPHONE HCL PF 1 MG/ML IJ SOLN
INTRAMUSCULAR | Status: AC
  Filled 2011-06-12: qty 1

## 2011-06-12 MED ORDER — GLYCOPYRROLATE 0.2 MG/ML IJ SOLN
INTRAMUSCULAR | Status: AC
  Filled 2011-06-12: qty 1

## 2011-06-12 MED ORDER — OXYCODONE-ACETAMINOPHEN 5-325 MG PO TABS: 1.0000 | ORAL_TABLET | ORAL | Status: AC | PRN

## 2011-06-12 MED ORDER — ONDANSETRON HCL 4 MG/2ML IJ SOLN
INTRAMUSCULAR | Status: AC
  Filled 2011-06-12: qty 2

## 2011-06-12 MED ORDER — ONDANSETRON HCL 4 MG/2ML IJ SOLN
INTRAMUSCULAR | Status: DC | PRN
  Administered 2011-06-12: 4 mg via INTRAVENOUS

## 2011-06-12 MED ORDER — FENTANYL CITRATE 0.05 MG/ML IJ SOLN
INTRAMUSCULAR | Status: AC
  Administered 2011-06-12: 25 ug via INTRAVENOUS
  Filled 2011-06-12: qty 2

## 2011-06-12 MED ORDER — FENTANYL CITRATE 0.05 MG/ML IJ SOLN
25.0000 ug | Freq: Once | INTRAMUSCULAR | Status: AC
  Administered 2011-06-12: 25 ug via INTRAVENOUS

## 2011-06-12 MED ORDER — HYDROMORPHONE HCL PF 1 MG/ML IJ SOLN
INTRAMUSCULAR | Status: DC | PRN
  Administered 2011-06-12 (×2): 0.5 mg via INTRAVENOUS

## 2011-06-12 MED ORDER — DEXAMETHASONE SODIUM PHOSPHATE 10 MG/ML IJ SOLN
INTRAMUSCULAR | Status: AC
  Filled 2011-06-12: qty 1

## 2011-06-12 MED ORDER — PROPOFOL 10 MG/ML IV EMUL
INTRAVENOUS | Status: DC | PRN
  Administered 2011-06-12: 150 mg via INTRAVENOUS
  Administered 2011-06-12: 50 mg via INTRAVENOUS

## 2011-06-12 MED ORDER — HYDROMORPHONE HCL PF 1 MG/ML IJ SOLN: 0.5000 mg | INTRAMUSCULAR | Status: AC

## 2011-06-12 MED ORDER — SCOPOLAMINE 1 MG/3DAYS TD PT72
1.0000 | MEDICATED_PATCH | Freq: Once | TRANSDERMAL | Status: DC
  Administered 2011-06-12: 1.5 mg via TRANSDERMAL

## 2011-06-12 MED ORDER — PROPOFOL 10 MG/ML IV EMUL
INTRAVENOUS | Status: AC
  Filled 2011-06-12: qty 20

## 2011-06-12 MED ORDER — IBUPROFEN 200 MG PO TABS: 200.0000 mg | ORAL_TABLET | Freq: Four times a day (QID) | ORAL | Status: DC | PRN

## 2011-06-12 MED ORDER — NEOSTIGMINE METHYLSULFATE 1 MG/ML IJ SOLN
INTRAMUSCULAR | Status: DC | PRN
  Administered 2011-06-12: 3 mg via INTRAVENOUS

## 2011-06-12 MED ORDER — IBUPROFEN 600 MG PO TABS: 600.0000 mg | ORAL_TABLET | Freq: Four times a day (QID) | ORAL | Status: AC | PRN

## 2011-06-12 MED ORDER — NEOSTIGMINE METHYLSULFATE 1 MG/ML IJ SOLN
INTRAMUSCULAR | Status: AC
  Filled 2011-06-12: qty 10

## 2011-06-12 MED ORDER — LACTATED RINGERS IR SOLN
Status: DC | PRN
  Administered 2011-06-12 (×2): 3000 mL

## 2011-06-12 MED ORDER — ROCURONIUM BROMIDE 100 MG/10ML IV SOLN
INTRAVENOUS | Status: DC | PRN
  Administered 2011-06-12: 40 mg via INTRAVENOUS

## 2011-06-12 MED ORDER — FENTANYL CITRATE 0.05 MG/ML IJ SOLN
INTRAMUSCULAR | Status: AC
  Filled 2011-06-12: qty 5

## 2011-06-12 MED ORDER — LACTATED RINGERS IV SOLN
INTRAVENOUS | Status: DC
  Administered 2011-06-12 (×3): via INTRAVENOUS

## 2011-06-12 MED ORDER — KETOROLAC TROMETHAMINE 30 MG/ML IJ SOLN
INTRAMUSCULAR | Status: DC | PRN
  Administered 2011-06-12: 30 mg via INTRAVENOUS

## 2011-06-12 MED ORDER — FENTANYL CITRATE 0.05 MG/ML IJ SOLN
INTRAMUSCULAR | Status: DC | PRN
  Administered 2011-06-12: 100 ug via INTRAVENOUS
  Administered 2011-06-12 (×3): 50 ug via INTRAVENOUS

## 2011-06-12 MED ORDER — LIDOCAINE HCL (CARDIAC) 20 MG/ML IV SOLN
INTRAVENOUS | Status: AC
  Filled 2011-06-12: qty 5

## 2011-06-12 MED ORDER — CEFAZOLIN SODIUM 1-5 GM-% IV SOLN
1.0000 g | INTRAVENOUS | Status: AC
  Administered 2011-06-12: 1 g via INTRAVENOUS

## 2011-06-12 MED ORDER — MIDAZOLAM HCL 2 MG/2ML IJ SOLN
INTRAMUSCULAR | Status: AC
  Filled 2011-06-12: qty 2

## 2011-06-12 MED ORDER — BUPIVACAINE HCL (PF) 0.25 % IJ SOLN
INTRAMUSCULAR | Status: DC | PRN
  Administered 2011-06-12: 5 mL

## 2011-06-12 MED ORDER — FENTANYL CITRATE 0.05 MG/ML IJ SOLN
25.0000 ug | INTRAMUSCULAR | Status: DC | PRN
  Administered 2011-06-12 (×2): 25 ug via INTRAVENOUS

## 2011-06-12 MED ORDER — HYDROMORPHONE HCL PF 1 MG/ML IJ SOLN
0.2500 mg | INTRAMUSCULAR | Status: DC | PRN
  Administered 2011-06-12 (×7): 0.5 mg via INTRAVENOUS

## 2011-06-12 MED ORDER — IBUPROFEN 600 MG PO TABS: 600.0000 mg | ORAL_TABLET | Freq: Four times a day (QID) | ORAL | Status: DC | PRN

## 2011-06-12 MED ORDER — DEXAMETHASONE SODIUM PHOSPHATE 4 MG/ML IJ SOLN
INTRAMUSCULAR | Status: DC | PRN
  Administered 2011-06-12: 10 mg via INTRAVENOUS

## 2011-06-12 MED ORDER — SCOPOLAMINE 1 MG/3DAYS TD PT72
MEDICATED_PATCH | TRANSDERMAL | Status: AC
  Administered 2011-06-12: 1.5 mg via TRANSDERMAL
  Filled 2011-06-12: qty 1

## 2011-06-12 MED ORDER — OXYCODONE-ACETAMINOPHEN 5-325 MG PO TABS: 1.0000 | ORAL_TABLET | ORAL | Status: DC | PRN

## 2011-06-12 SURGICAL SUPPLY — 45 items
ABLATOR ENDOMETRIAL BIPOLAR (ABLATOR) ×3 IMPLANT
ADH SKN CLS APL DERMABOND .7 (GAUZE/BANDAGES/DRESSINGS) ×2
APL SKNCLS STERI-STRIP NONHPOA (GAUZE/BANDAGES/DRESSINGS)
BAG SPEC RTRVL LRG 6X4 10 (ENDOMECHANICALS)
BARRIER ADHS 3X4 INTERCEED (GAUZE/BANDAGES/DRESSINGS) ×1 IMPLANT
BENZOIN TINCTURE PRP APPL 2/3 (GAUZE/BANDAGES/DRESSINGS) IMPLANT
BRR ADH 4X3 ABS CNTRL BYND (GAUZE/BANDAGES/DRESSINGS) ×2
CABLE HIGH FREQUENCY MONO STRZ (ELECTRODE) ×1 IMPLANT
CANISTER SUCTION 2500CC (MISCELLANEOUS) ×4 IMPLANT
CATH ROBINSON RED A/P 16FR (CATHETERS) ×2 IMPLANT
CLOTH BEACON ORANGE TIMEOUT ST (SAFETY) ×3 IMPLANT
CONTAINER PREFILL 10% NBF 60ML (FORM) ×6 IMPLANT
COVER TABLE BACK 60X90 (DRAPES) ×1 IMPLANT
DECANTER SPIKE VIAL GLASS SM (MISCELLANEOUS) ×1 IMPLANT
DERMABOND ADVANCED (GAUZE/BANDAGES/DRESSINGS) ×1
DERMABOND ADVANCED .7 DNX12 (GAUZE/BANDAGES/DRESSINGS) IMPLANT
DRAPE UTILITY XL STRL (DRAPES) ×3 IMPLANT
DRESSING TELFA 8X3 (GAUZE/BANDAGES/DRESSINGS) ×1 IMPLANT
FORCEPS CUTTING 33CM 5MM (CUTTING FORCEPS) IMPLANT
GAUZE SPONGE 4X4 16PLY XRAY LF (GAUZE/BANDAGES/DRESSINGS) ×1 IMPLANT
GLOVE BIO SURGEON STRL SZ 6.5 (GLOVE) ×11 IMPLANT
GOWN PREVENTION PLUS LG XLONG (DISPOSABLE) ×6 IMPLANT
IV LACTATED RINGER IRRG 3000ML (IV SOLUTION) ×6
IV LR IRRIG 3000ML ARTHROMATIC (IV SOLUTION) IMPLANT
NDL SPNL 22GX3.5 QUINCKE BK (NEEDLE) ×2 IMPLANT
NEEDLE SPNL 22GX3.5 QUINCKE BK (NEEDLE) ×3 IMPLANT
NS IRRIG 1000ML POUR BTL (IV SOLUTION) ×3 IMPLANT
PACK HYSTEROSCOPY LF (CUSTOM PROCEDURE TRAY) ×2 IMPLANT
PACK LAPAROSCOPY BASIN (CUSTOM PROCEDURE TRAY) ×3 IMPLANT
POUCH SPECIMEN RETRIEVAL 10MM (ENDOMECHANICALS) IMPLANT
SCISSORS LAP 5X35 DISP (ENDOMECHANICALS) ×1 IMPLANT
SCRUB PCMX 4 OZ (MISCELLANEOUS) ×1 IMPLANT
SET IRRIG TUBING LAPAROSCOPIC (IRRIGATION / IRRIGATOR) ×3 IMPLANT
SOLUTION ELECTROLUBE (MISCELLANEOUS) IMPLANT
STRIP CLOSURE SKIN 1/4X3 (GAUZE/BANDAGES/DRESSINGS) IMPLANT
SUT CHROMIC 2 0 SH (SUTURE) ×1 IMPLANT
SUT PLAIN 3 0 FS 2 27 (SUTURE) ×3 IMPLANT
SUT VICRYL 0 UR6 27IN ABS (SUTURE) ×1 IMPLANT
SYR CONTROL 10ML LL (SYRINGE) ×3 IMPLANT
TOWEL OR 17X24 6PK STRL BLUE (TOWEL DISPOSABLE) ×6 IMPLANT
TRAY FOLEY CATH 14FR (SET/KITS/TRAYS/PACK) ×3 IMPLANT
TROCAR Z-THREAD BLADED 11X100M (TROCAR) ×1 IMPLANT
TUBING HYDROFLEX HYSTEROSCOPY (TUBING) ×1 IMPLANT
WARMER LAPAROSCOPE (MISCELLANEOUS) ×3 IMPLANT
WATER STERILE IRR 1000ML POUR (IV SOLUTION) ×3 IMPLANT

## 2011-06-12 NOTE — Anesthesia Preprocedure Evaluation (Signed)
Anesthesia Evaluation  Patient identified by MRN, date of birth, ID band Patient awake    Reviewed: Allergy & Precautions, H&P , Patient's Chart, lab work & pertinent test results, reviewed documented beta blocker date and time   History of Anesthesia Complications (+) PONV  Airway Mallampati: II TM Distance: >3 FB Neck ROM: full    Dental No notable dental hx.    Pulmonary  clear to auscultation  Pulmonary exam normal       Cardiovascular + dysrhythmias (only when pregnant) Supra Ventricular Tachycardia regular Normal    Neuro/Psych    GI/Hepatic GERD-  Medicated,  Endo/Other  Hyperthyroidism   Renal/GU      Musculoskeletal   Abdominal   Peds  Hematology   Anesthesia Other Findings   Reproductive/Obstetrics                           Anesthesia Physical Anesthesia Plan  ASA: II  Anesthesia Plan: Epidural   Post-op Pain Management:    Induction: Intravenous  Airway Management Planned: Oral ETT  Additional Equipment:   Intra-op Plan:   Post-operative Plan:   Informed Consent: I have reviewed the patients History and Physical, chart, labs and discussed the procedure including the risks, benefits and alternatives for the proposed anesthesia with the patient or authorized representative who has indicated his/her understanding and acceptance.   Dental Advisory Given  Plan Discussed with: CRNA and Surgeon  Anesthesia Plan Comments: (  Discussed  general anesthesia, including possible nausea, instrumentation of airway, sore throat,pulmonary aspiration, etc. I asked if the were any outstanding questions, or  concerns before we proceeded. )        Anesthesia Quick Evaluation

## 2011-06-12 NOTE — Op Note (Signed)
NAMEBERNISHA, VERMA                 ACCOUNT NO.:  [id number]  MEDICAL RECORD NO.:  [id number]  LOCATION:  [facility]                          FACILITY:  [facility]  PHYSICIAN:  Randye Lobo, M.D.   DATE OF BIRTH:  April 12, 1971  DATE OF PROCEDURE:  06/12/2011 DATE OF DISCHARGE:  06/12/2011                              OPERATIVE REPORT   PREOPERATIVE DIAGNOSES: 1. Left lower quadrant pain. 2. Chronic pelvic pain. 3. Dysmenorrhea. 4. History of menorrhagia. 5. Mirena intrauterine device patient.  POSTOPERATIVE DIAGNOSES: 1. Left lower quadrant pain. 2. Dysmenorrhea. 3. Chronic pelvic pain. 4. History of menorrhagia. 5. Simple left ovarian cyst. 6. Mirena intrauterine device patient.  PROCEDURE:  Mirena intrauterine device removal, hysteroscopy with dilation and curettage, NovaSure endometrial ablation, laparoscopic left ovarian cystectomy.  SPECIMENS:  Endometrial curettings and the left ovarian cysts.  Cyst walls were sent to pathology separately.  SURGEON:  Randye Lobo, MD  ASSISTANT:  Philip Aspen, DO  ANESTHESIA:  Going to be general endotracheal.  ESTIMATED BLOOD LOSS:  Minimal.  HYSTEROSCOPIC FLUID DEFICIT:  70 mL.  COMPLICATIONS:  None.  INDICATIONS FOR THE PROCEDURE:  The patient is a 40 year old, para 1 Caucasian female, with a Mirena IUD, who presents with left lower quadrant pain, dysmenorrhea, and chronic pelvic pain.  The patient had a Mirena IUD placed to help treat some of these symptoms in addition to heavy menstrual bleeding.  The patient on office ultrasound was noted to have a simple left ovarian cyst with no abnormal blood flow to it.  The IUD was noted to be in a proper position in the endometrial canal.  The patient is declining any future childbearing and she wishes to have the Mirena IUD removed along with having a NovaSure endometrial ablation and a laparoscopy to further evaluate and treat her pain.  A plan is therefore made to proceed with a  Mirena IUD removal with hysteroscopy, dilation and curettage, NovaSure endometrial ablation and laparoscopy with possible treatment of adhesive disease or endometriosis.  FINDINGS:  Examination laparoscopically demonstrated a normal uterus, right ovary and bilateral fallopian tubes.  There was a 3.5-cm ovary on the left.  This contained a 3-cm simple left ovarian cyst and also an adjacent 1-cm left simple ovarian cyst.  There is no evidence of any endometriosis nor adhesive disease in the abdomen nor the pelvis.  The upper abdomen including the liver, gallbladder, stomach region and bowel surfaces appeared to be normal. The appendix was retrocecal and could not be visualized.  Hysteroscopy demonstrated a normal uterine cavity with no evidence of any polyps or fibroids.  PROCEDURE:  The patient was reidentified in the preoperative hold area. The patient received Ancef IV for antibiotic prophylaxis.  She received both TED hose and PAS stockings for DVT prophylaxis.  In the operating room, general endotracheal anesthesia was induced.  The patient was placed in the dorsal lithotomy position.  The abdomen and vagina were sterilely prepped and draped.  A Foley catheter was placed inside the bladder.  A weighted speculum was placed inside the vagina and a single-tooth tenaculum was placed on the anterior cervical lip.  The uterus was sounded to 8.5-cm.  The  endocervix was measured at 3-cm.  The cervix was dilated to accommodate the diagnostic hysteroscope which was then placed without difficulty.  The endometrial cavity was visualized and found to be unremarkable.  The cervix was then dilated to a #29 Pratt dilator.  Curettage was performed with a sharp curette in all 4 quadrants.  The endometrial curettings were sent to pathology.  The NovaSure device was then inserted into the uterine cavity to the level of the fundus and withdrawn slightly.  The device was locked into place.  The  cavity width was measured at 3-cm.  The CO2 test was performed and the uterine cavity was noted to be intact.  The NovaSure device was employed with a power setting of 91 and over 80 seconds.  The device was then removed without difficulty.  The Hulka tenaculum was placed in the uterus and the remaining vaginal instruments were removed.  Attention was turned to the abdomen, where a 1-cm umbilical incision was created sharply with a scalpel.  A 10-mm trocar was inserted directly into the peritoneal cavity.  The laparoscope confirmed proper placement. A CO2 pneumoperitoneum was achieved and the patient was then placed in the  Trendelenburg position.  An inspection of the pelvic and abdominal organs was performed and the findings are as noted above.  As the patient was having persistent pain in the left lower quadrant, the decision was made to proceed with a laparoscopic left ovarian cystectomy.  A 5-mm right lower quadrant and a 5-mm left lower quadrant incision were each created with a scalpel and 5-mm trocars were then placed under direct visualization of the laparoscope.  The left ovary was further examined.  The ovary was grasped and then monopolar cautery was used to score the ovarian cortex which was then opened with a laparoscopic scissors.  The simple cyst was ruptured during this incision.  Clear light yellow fluid was noted.  The cyst wall was grasped at this time and was peeled out from the surrounding ovarian cortex.  The specimen was sent to pathology.  The smaller cyst was similarly removed with blunt dissection and it was sent to pathology together all as 1 specimen.  Monopolar cautery and the Kleppinger forceps were then used to create hemostasis along the ovarian cortex and at the base of the dissection. Hemostasis was then good.  The pelvis was irrigated and suctioned.  The ovary was hemostatic after doing some small additional cautery with the Kleppinger  forceps.  The piece of Interceed was then used to wrap around the left ovary.  The lower abdominal trocars were removed under visualization of the laparoscope.  The pneumoperitoneum was released and the umbilical trocar and laparoscope were removed simultaneously.  The fascia along the umbilical incision was closed with an interrupted suture of 0 Vicryl.  The skin incisions were closed with subcuticular sutures of 3-0 plain gut suture.  The incisions were injected locally with 0.25% Marcaine and Dermabond was placed over the incisions.  The patient was cleansed of remaining Betadine.  The Hulka tenaculum was removed.  I then proceeded to report to the family the surgical procedure and findings.  Shortly thereafter, I was called back into the operating room to examine the patient's cervix and she had bleeding from the tenaculum site. Pressure was not adequate to control the bleeding and so I sterilely placed 2 figure-of-eight sutures of 2-0 chromic on the anterior cervical lip which controlled the bleeding well.  This concluded the patient's surgery.  She was awakened  and extubated and escorted to the recovery room in stable condition.  There were no complications to the procedure.  All needle, instrument and sponge counts were correct.     Randye Lobo, M.D.     BES/MEDQ  D:  06/12/2011  T:  06/12/2011  Job:  [id number]

## 2011-06-12 NOTE — H&P (Signed)
NAMEKAYDE, ATKERSON                 ACCOUNT NO.:  [id number]  MEDICAL RECORD NO.:  [id number]  LOCATION:  [facility]                         [facility]:  [facility]  PHYSICIAN:  Randye Lobo, M.D.   DATE OF BIRTH:  22-May-1971  DATE OF ADMISSION:  05/18/2011 DATE OF DISCHARGE:                             HISTORY & PHYSICAL   CHIEF COMPLAINT:  Painful menses and pelvic pain.  HISTORY OF PRESENT ILLNESS:  The patient is a 40 year old gravida 1, para 1 Caucasian female, status post Mirena IUD insertion March 08, 2009, who presents with a longstanding history of pelvic pain, back pain, and painful menstruation.  The patient has a history of heavy menstrual bleeding, which has improved with her Mirena IUD.  However, the patient's pelvic pain and discomfort persists.  Her pain is localized more to the left lower quadrant and occurs off and on, but is very persistent and is worse with positional changes.  Over the last several years, the patient has had multiple imaging studies including pelvic and abdominal ultrasounds.  Her last pelvic ultrasound was performed May 01, 2011, and documented a normal uterus with the IUD in the endometrial canal.  The endometrial thickness measured 3.56 mm.  The right ovary contained a 1.8-cm simple ovarian cyst with no increase in blood flow by color Doppler study.  The day of her preoperative visit, June 11, 2011, the patient underwent a CT of the abdomen and pelvis, which documented the IUD to be in satisfactory position.  There was a 2.3-cm left ovarian cyst/follicle.  There were no other remarkable findings.  The patient does have a history of a laparoscopy and hysteroscopy, D and C, performed 12 years ago in [facility].  She states her diagnosis was endometriosis at that time.  PAST OBSTETRIC AND GYNECOLOGIC HISTORY:  The patient is status post vacuum-assisted vaginal delivery in 2007.  The patient has a Mirena IUD. She is status post anterior  colporrhaphy, posterior colporrhaphy, and midurethral sling by Dr. Sherron Monday in 2009.  The patient's last Pap smear was performed May 16, 2010, and was within normal limits.  Her last mammogram was performed May 16, 2010, and was also within normal limits.  PAST MEDICAL HISTORY: 1. Reflux. 2. History of hyperthyroidism.  The patient is not on any current     medication. 3. History of lower extremity weakness.  The patient has had MRIs of     her brain and spine.  The patient has been told that she may have     suffered an ischemic event due to heavy bleeding related to her delivery of her child.  PAST SURGICAL HISTORY: 1. Status post laparoscopy and hysteroscopy, D and C 12 years ago. 2. Status post anterior colporrhaphy, posterior colporrhaphy, and     midurethral sling in 2009. 3. Status post left shoulder surgery.  MEDICATIONS: 1. Zegerid. 2. Zantac.  ALLERGIES:  The patient has intolerance is to CODEINE, VICODIN, and MORPHINE.  She reports that she also has allergic reaction to MRI DYE.  SOCIAL HISTORY:  The patient is in a committed relationship.  She works for [employer].  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  She denies use of tobacco.  FAMILY HISTORY:  Noncontributory.  PHYSICAL EXAMINATION:  VITAL SIGNS:  Height 5 feet 3 inches, weight 120 pounds, blood pressure is 112/70. HEENT:  Normocephalic, atraumatic. LUNGS:  Clear to auscultation bilaterally. HEART:  S1, S2 with a regular rate and rhythm. ABDOMEN:  Soft and nontender, and without evidence of hepatosplenomegaly or organomegaly. PELVIC:  Normal external genitalia and urethra.  The IUD strings are visible.  The cervix and vagina demonstrate no lesions.  The uterus is small and nontender.  No adnexal masses, no tenderness appreciated.  The anal region is unremarkable.  IMPRESSION:  The patient is a 40 year old para 1 female with a prior history of endometriosis.  The patient has a Mirena IUD.   She has chronic pelvic pain and dysmenorrhea.  She has a history of menorrhagia without use of the Mirena IUD.  PLAN:  The patient will undergo a hysteroscopy, D and C with Mirena IUD removal.  This will be followed by a NovaSure endometrial ablation.  The patient will also undergo a laparoscopy with possible treatment of endometriosis.  Risks, benefits, alternatives, and surgical goals have been discussed with the patient who wishes to proceed.     Randye Lobo, M.D.     BES/MEDQ  D:  06/12/2011  T:  06/12/2011  Job:  [id number]

## 2011-06-12 NOTE — OR Nursing (Signed)
Interceed barrier placed @ left ovary in OR suite per Dr. Edward Jolly.

## 2011-06-12 NOTE — Progress Notes (Signed)
Pre Op Note  No marked change in status since office pre-op visit.  Patient examined.  OK to proceed with surgery.

## 2011-06-12 NOTE — Transfer of Care (Signed)
Immediate Anesthesia Transfer of Care Note  Patient: [name]  Procedure(s) Performed:  DILATATION & CURETTAGE/HYSTEROSCOPY WITH NOVASURE ABLATION - Removal OF Intrauterine Device; LAPAROSCOPY OPERATIVE -  Left Ovarian Cystectomy  Patient Location: PACU  Anesthesia Type: General  Level of Consciousness: awake, alert  and oriented  Airway & Oxygen Therapy: Patient Spontanous Breathing and Patient connected to nasal cannula oxygen  Post-op Assessment: Report given to PACU RN and Post -op Vital signs reviewed and stable  Post vital signs: Reviewed and stable  Complications: No apparent anesthesia complications

## 2011-06-12 NOTE — OR Nursing (Signed)
Surgeon out of room and called back for bleeding cervix. See times under events.

## 2011-06-12 NOTE — Anesthesia Postprocedure Evaluation (Signed)
Anesthesia Post Note  Patient: [name]  Procedure(s) Performed:  DILATATION & CURETTAGE/HYSTEROSCOPY WITH NOVASURE ABLATION - Removal OF Intrauterine Device; LAPAROSCOPY OPERATIVE -  Left Ovarian Cystectomy  Anesthesia type: General  Patient location: PACU  Post pain: Pain level controlled  Post assessment: Post-op Vital signs reviewed  Last Vitals:  Filed Vitals:   06/12/11 1615  BP: 104/60  Pulse: 98  Temp:   Resp: 22    Post vital signs: Reviewed  Level of consciousness: sedated  Complications: No apparent anesthesia complications

## 2011-06-12 NOTE — OR Nursing (Signed)
Interceed barrier placed intra peritoneal in OR suite per Dr. Edward Jolly.

## 2011-06-12 NOTE — Brief Op Note (Signed)
06/12/2011  1:59 PM  PATIENT:  Zakya D Depaolo  40 y.o. female  PRE-OPERATIVE DIAGNOSIS:  dysmenorrhea; left lower quadrant pain  POST-OPERATIVE DIAGNOSIS:  dysmenorrhea; left lower qudrant pain; simple left ovarian cysts  PROCEDURE:  Procedure(s): DILATATION & CURETTAGE/HYSTEROSCOPY WITH NOVASURE ABLATION LAPAROSCOPY OPERATIVE WITH LEFT OVARIAN CYSTECTOMY SUTURING OF CERVICAL TENACULUM SITE  SURGEON:  Surgeon(s): Philip Aspen, DO Brook A Silva Brook A Silva  PHYSICIAN ASSISTANT:   ASSISTANTS:  Philip Aspen  ANESTHESIA:   general  EBL:  Total I/O In: 2000 [I.V.:2000] Out: 250 [Urine:200; Blood:50]  BLOOD ADMINISTERED:none  DRAINS: none   LOCAL MEDICATIONS USED:  MARCAINE  5 CC  SPECIMEN:  Source of Specimen:  Endometrial curettings, left ovarian cyst walls  DISPOSITION OF SPECIMEN:  PATHOLOGY  COUNTS:  YES  TOURNIQUET:  * No tourniquets in log *  DICTATION: .Other Dictation: Dictation Number   PLAN OF CARE: Discharge to home after PACU  PATIENT DISPOSITION:  PACU - hemodynamically stable.   Delay start of Pharmacological VTE agent (>24hrs) due to surgical blood loss or risk of bleeding:  {YES/NO/NOT APPLICABLE:20182

## 2011-06-15 ENCOUNTER — Encounter (HOSPITAL_COMMUNITY): Payer: Self-pay | Admitting: Obstetrics and Gynecology

## 2011-07-07 ENCOUNTER — Ambulatory Visit: Admit: 2011-07-07 | Payer: Self-pay | Admitting: Obstetrics and Gynecology

## 2011-07-07 SURGERY — LAPAROSCOPY OPERATIVE
Anesthesia: Choice

## 2012-07-12 ENCOUNTER — Other Ambulatory Visit: Payer: Self-pay

## 2012-10-31 ENCOUNTER — Other Ambulatory Visit: Payer: Self-pay | Admitting: Occupational Medicine

## 2012-10-31 ENCOUNTER — Ambulatory Visit: Payer: Self-pay

## 2012-10-31 DIAGNOSIS — R52 Pain, unspecified: Secondary | ICD-10-CM

## 2012-12-14 ENCOUNTER — Ambulatory Visit: Payer: PRIVATE HEALTH INSURANCE | Attending: Physical Medicine and Rehabilitation | Admitting: Physical Therapy

## 2012-12-15 ENCOUNTER — Ambulatory Visit: Payer: PRIVATE HEALTH INSURANCE | Attending: Physical Medicine and Rehabilitation | Admitting: Physical Therapy

## 2012-12-15 DIAGNOSIS — M542 Cervicalgia: Secondary | ICD-10-CM | POA: Insufficient documentation

## 2012-12-15 DIAGNOSIS — IMO0001 Reserved for inherently not codable concepts without codable children: Secondary | ICD-10-CM | POA: Insufficient documentation

## 2012-12-15 DIAGNOSIS — R293 Abnormal posture: Secondary | ICD-10-CM | POA: Insufficient documentation

## 2012-12-27 ENCOUNTER — Ambulatory Visit: Payer: PRIVATE HEALTH INSURANCE | Attending: Physical Medicine and Rehabilitation | Admitting: Physical Therapy

## 2012-12-27 DIAGNOSIS — R293 Abnormal posture: Secondary | ICD-10-CM | POA: Insufficient documentation

## 2012-12-27 DIAGNOSIS — IMO0001 Reserved for inherently not codable concepts without codable children: Secondary | ICD-10-CM | POA: Insufficient documentation

## 2012-12-27 DIAGNOSIS — M542 Cervicalgia: Secondary | ICD-10-CM | POA: Insufficient documentation

## 2012-12-29 ENCOUNTER — Ambulatory Visit: Payer: PRIVATE HEALTH INSURANCE | Admitting: Physical Therapy

## 2012-12-29 ENCOUNTER — Encounter: Payer: PRIVATE HEALTH INSURANCE | Admitting: Physical Therapy

## 2012-12-30 IMAGING — CT CT ABD-PELV W/ CM
2 of 5 series · 13 of 32 positions shown, 18 images · IV contrast (READICAT & 80ml omni 300)
Comparison: Ultrasound dated 05/19/2011

CLINICAL DATA: Epigastric/left lower quadrant abdominal pain,
nausea x 6 weeks

CT ABDOMEN AND PELVIS WITH CONTRAST
TECHNIQUE: Multidetector CT imaging of the abdomen and pelvis was
performed following the standard protocol during bolus
administration of intravenous contrast.
Contrast: 80mL OMNIPAQUE IOHEXOL 300 MG/ML IV SOLN

[Series 2: routine abdomen · axial · 0.70mm/px · z∈[-361,-111]mm · 5 of 76 slices shown, 10 images]
[im 13/76  soft-tissue]
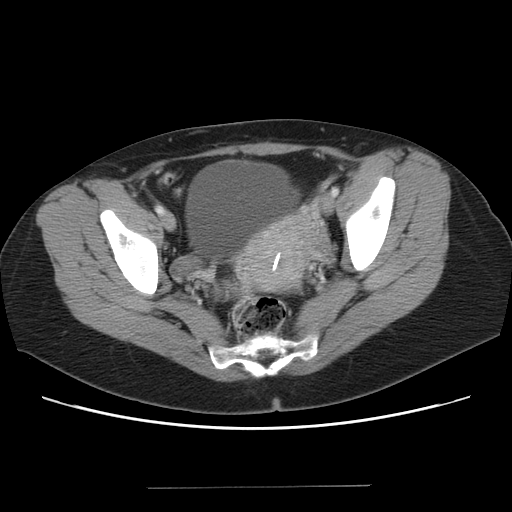
[im 13/76  bone]
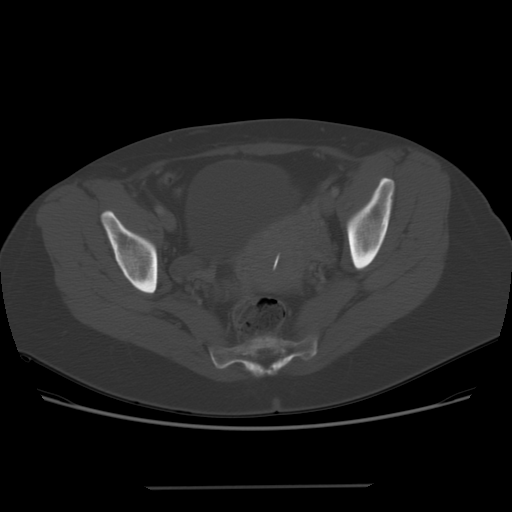
[im 26/76  soft-tissue]
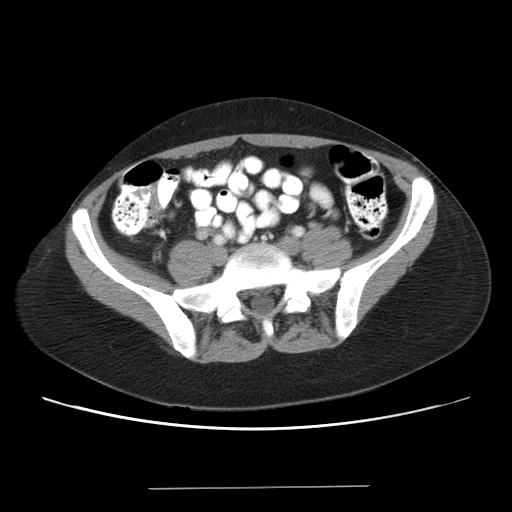
[im 26/76  lung]
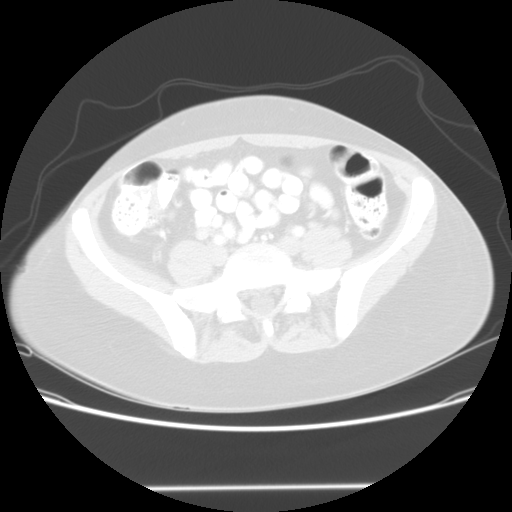
[im 38/76  soft-tissue]
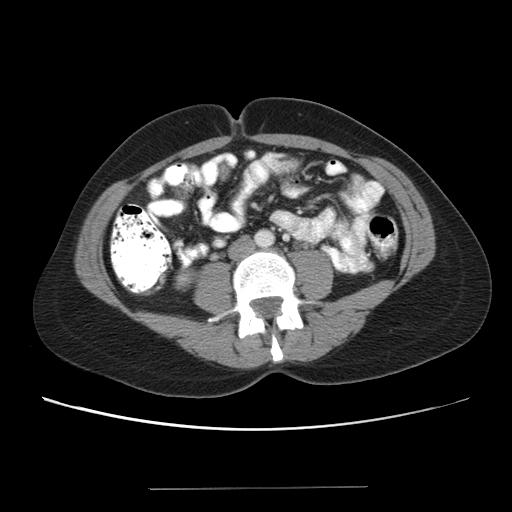
[im 38/76  lung]
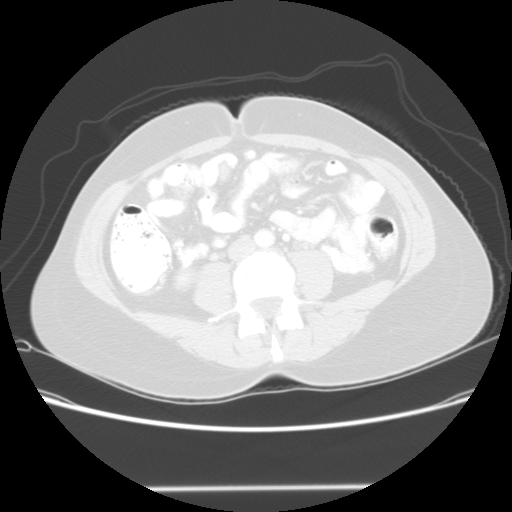
[im 51/76  soft-tissue]
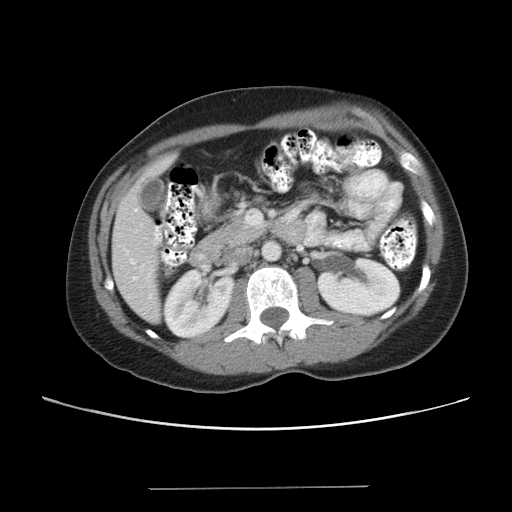
[im 51/76  lung]
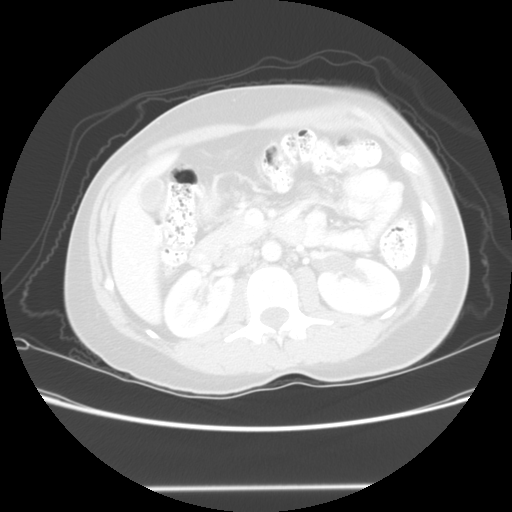
[im 63/76  soft-tissue]
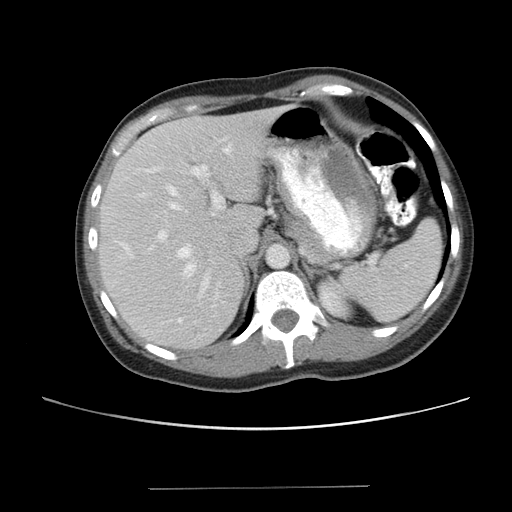
[im 63/76  lung]
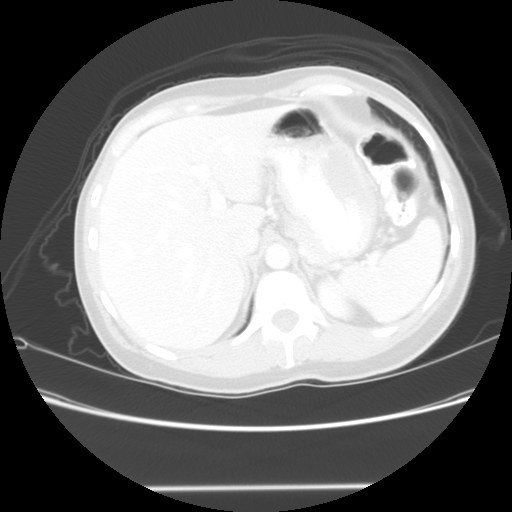

[Series 401: sag · sagittal · 0.83mm/px · 8 of 158 slices shown]
[im 15/158  soft-tissue]
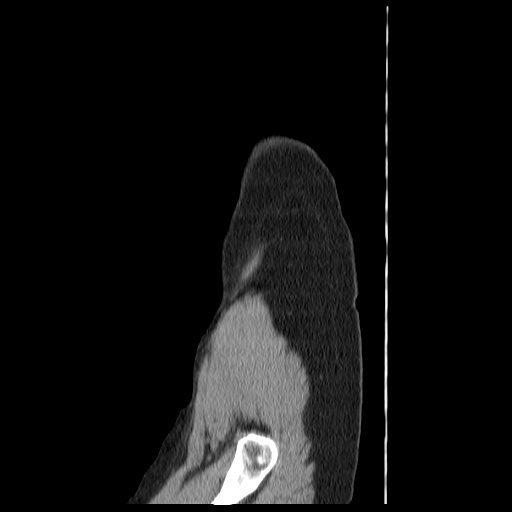
[im 29/158  soft-tissue]
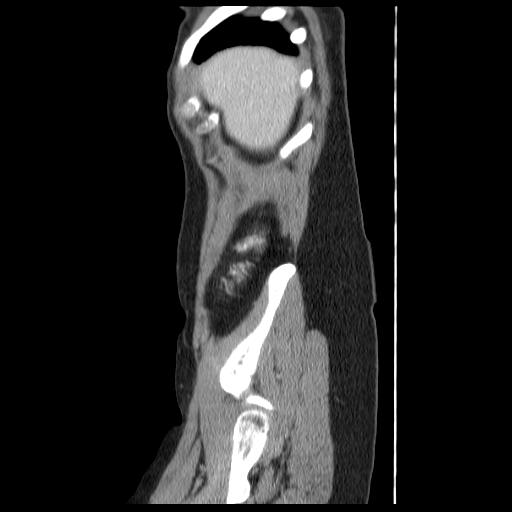
[im 58/158  soft-tissue]
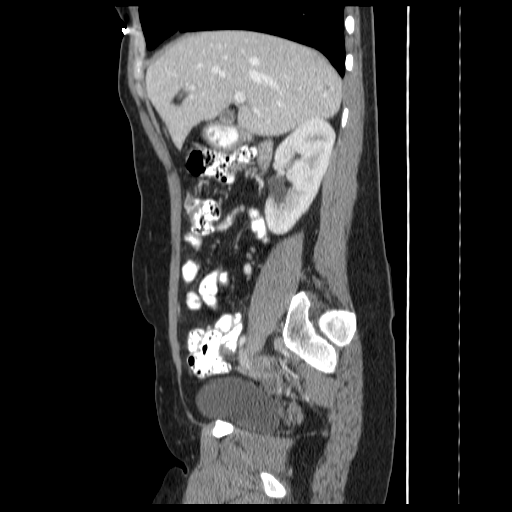
[im 72/158  soft-tissue]
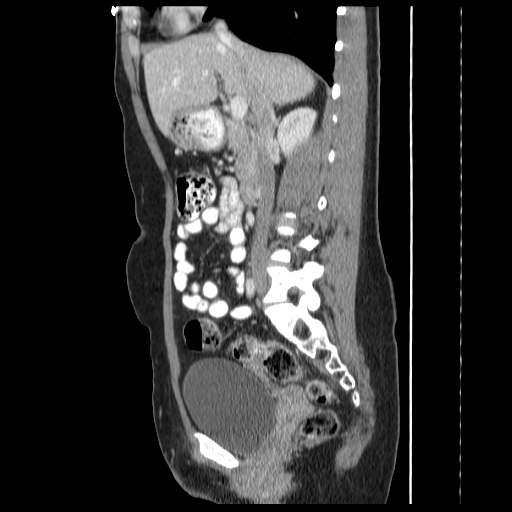
[im 86/158  soft-tissue]
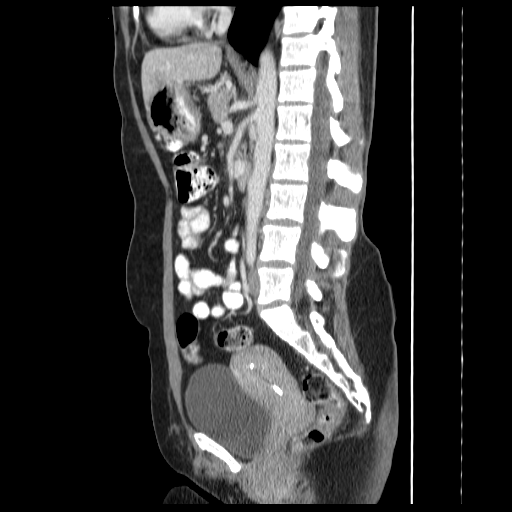
[im 100/158  soft-tissue]
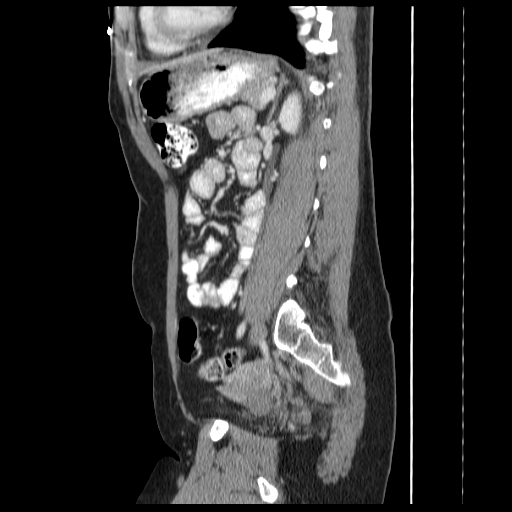
[im 129/158  soft-tissue]
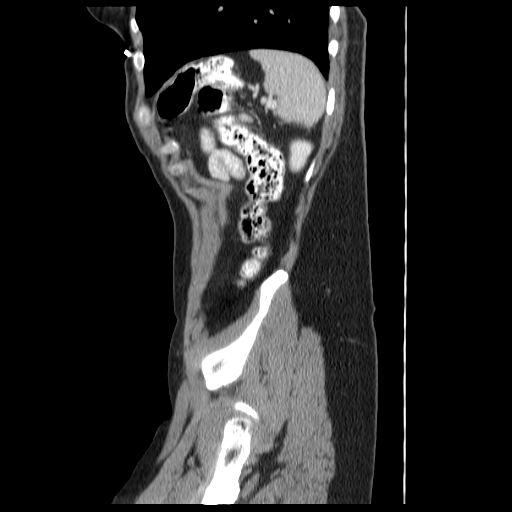
[im 143/158  soft-tissue]
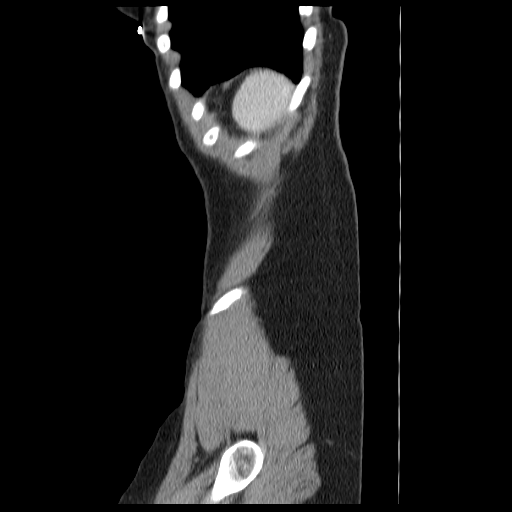

[13 of 32 positions shown; findings below may reference images not displayed]

FINDINGS: Lung bases are clear.

Liver, spleen, pancreas, and adrenal glands within normal limits.

Gallbladder is unremarkable.  No intrahepatic or extrahepatic
ductal dilatation.

Kidneys are notable for a tiny right renal cyst (series 2/image 30)
and an extrarenal pelvic bilaterally.  No hydronephrosis.

No evidence of bowel obstruction.  Normal appendix.  No colonic
wall thickening or inflammatory changes.

No evidence of abdominal aortic aneurysm.

No abdominopelvic ascites.

No suspicious abdominopelvic lymphadenopathy.

Uterus is notable for an IUD in satisfactory position.  Ovaries are
notable for a 2.3 cm left ovarian cyst/follicle.

Bladder is within normal limits.

Visualized osseous structures are within normal limits.
IMPRESSION: No evidence of bowel obstruction.  Normal appendix.  No colonic
wall thickening or inflammatory changes.

IUD in satisfactory position.

2.3 cm left ovarian cyst/follicle.

## 2013-01-03 ENCOUNTER — Ambulatory Visit: Payer: PRIVATE HEALTH INSURANCE | Admitting: Physical Therapy

## 2013-01-05 ENCOUNTER — Ambulatory Visit: Payer: PRIVATE HEALTH INSURANCE | Admitting: Physical Therapy

## 2013-01-10 ENCOUNTER — Ambulatory Visit: Payer: PRIVATE HEALTH INSURANCE | Admitting: Physical Therapy

## 2013-01-11 ENCOUNTER — Ambulatory Visit: Payer: PRIVATE HEALTH INSURANCE | Admitting: Physical Therapy

## 2013-01-12 ENCOUNTER — Ambulatory Visit: Payer: PRIVATE HEALTH INSURANCE | Admitting: Physical Therapy

## 2013-01-18 ENCOUNTER — Ambulatory Visit: Payer: PRIVATE HEALTH INSURANCE | Admitting: Physical Therapy

## 2013-02-03 ENCOUNTER — Other Ambulatory Visit (HOSPITAL_COMMUNITY): Payer: Self-pay | Admitting: Physical Medicine and Rehabilitation

## 2013-02-03 DIAGNOSIS — M542 Cervicalgia: Secondary | ICD-10-CM

## 2013-02-07 ENCOUNTER — Ambulatory Visit (HOSPITAL_COMMUNITY)
Admission: RE | Admit: 2013-02-07 | Discharge: 2013-02-07 | Disposition: A | Discharge status: Home / Self Care | Payer: PRIVATE HEALTH INSURANCE | Source: Ambulatory Visit | Attending: Physical Medicine and Rehabilitation | Admitting: Physical Medicine and Rehabilitation

## 2013-02-07 DIAGNOSIS — R51 Headache: Secondary | ICD-10-CM | POA: Insufficient documentation

## 2013-02-07 DIAGNOSIS — R209 Unspecified disturbances of skin sensation: Secondary | ICD-10-CM | POA: Insufficient documentation

## 2013-02-07 DIAGNOSIS — M542 Cervicalgia: Secondary | ICD-10-CM | POA: Insufficient documentation

## 2013-02-07 DIAGNOSIS — M79609 Pain in unspecified limb: Secondary | ICD-10-CM | POA: Insufficient documentation

## 2013-02-07 DIAGNOSIS — M5030 Other cervical disc degeneration, unspecified cervical region: Secondary | ICD-10-CM | POA: Insufficient documentation

## 2013-06-12 ENCOUNTER — Ambulatory Visit: Payer: Self-pay | Admitting: Podiatry

## 2013-06-21 ENCOUNTER — Ambulatory Visit (INDEPENDENT_AMBULATORY_CARE_PROVIDER_SITE_OTHER): Payer: 59 | Admitting: Podiatry

## 2013-06-21 ENCOUNTER — Encounter: Payer: Self-pay | Admitting: Podiatry

## 2013-06-21 VITALS — BP 101/62 | HR 93 | Resp 16 | Ht 63.0 in | Wt 118.0 lb

## 2013-06-21 DIAGNOSIS — B351 Tinea unguium: Secondary | ICD-10-CM

## 2013-06-21 NOTE — Progress Notes (Signed)
Patient ID: [name], female   DOB: Apr 13, 1971, 42 y.o.   MRN: [id number] Subjective: Patient presents for followup for laser therapy on the first and third toenails. Treatment #1 12/16/2011, treatment #2 01/13/2012, treatment #3 07/12/2012. Patient continues to apply topical medication.  Objective: Residual dystrophic changes noted in the distal first and third toenails bilaterally. Clinical photographs demonstrate a visual improvement.  Assessment: Improving but residual distal onychomycoses 13 bilaterally  Plan: Return for her laser treatment #4 it initially convenient time. Maintain topical medication to the nails 1,3 right and left.

## 2013-07-13 ENCOUNTER — Other Ambulatory Visit: Payer: Self-pay | Admitting: Gastroenterology

## 2013-07-13 ENCOUNTER — Other Ambulatory Visit (HOSPITAL_COMMUNITY): Payer: Self-pay | Admitting: Gastroenterology

## 2013-07-13 DIAGNOSIS — R1310 Dysphagia, unspecified: Secondary | ICD-10-CM

## 2013-07-14 ENCOUNTER — Ambulatory Visit (HOSPITAL_COMMUNITY)
Admission: RE | Admit: 2013-07-14 | Discharge: 2013-07-14 | Disposition: A | Discharge status: Home / Self Care | Payer: 59 | Source: Ambulatory Visit | Attending: Gastroenterology | Admitting: Gastroenterology

## 2013-07-14 ENCOUNTER — Other Ambulatory Visit: Payer: PRIVATE HEALTH INSURANCE

## 2013-07-14 DIAGNOSIS — R1310 Dysphagia, unspecified: Secondary | ICD-10-CM | POA: Insufficient documentation

## 2013-07-17 ENCOUNTER — Ambulatory Visit (HOSPITAL_COMMUNITY): Payer: PRIVATE HEALTH INSURANCE

## 2013-07-19 ENCOUNTER — Ambulatory Visit: Payer: 59 | Admitting: Podiatry

## 2013-07-19 ENCOUNTER — Encounter: Payer: Self-pay | Admitting: Podiatry

## 2013-07-19 VITALS — BP 104/60 | HR 99 | Resp 12

## 2013-07-19 DIAGNOSIS — B351 Tinea unguium: Secondary | ICD-10-CM

## 2013-07-19 NOTE — Progress Notes (Signed)
Patient ID: [name], female   DOB: [birth year], 42 y.o.   MRN: [id number] Subjective: Patient presents for laser treatment #4. Treatment 1 on 12/16/2011, treatment #2on 01/13/2012, treatment# 3 on  07/12/2012 and final treatment today on 07/19/2013  Objective: Orientated x42 year old white female Distal thickening of toenails noted first and third toes right as well as distal toes one, 3, 5 left foot.  Assessment: Residual distal onychomycoses 1, 3 right and 1, 3, and 5 left  Plan: Patient transferred to surgical room where laser energy was delivered at 5 MHz, level IV with 7.5 J. Patient tolerated procedure without a complaint or any physical evidence erythema, edema or skin lesions. Patient will continue to apply topical antifungal medication to these nail sites and return if she has any future concerns.

## 2013-07-31 ENCOUNTER — Encounter: Payer: Self-pay | Admitting: Obstetrics and Gynecology

## 2013-07-31 ENCOUNTER — Ambulatory Visit (INDEPENDENT_AMBULATORY_CARE_PROVIDER_SITE_OTHER): Payer: 59 | Admitting: Obstetrics and Gynecology

## 2013-07-31 VITALS — BP 100/58 | HR 90 | Resp 16 | Ht 63.0 in | Wt 119.5 lb

## 2013-07-31 DIAGNOSIS — Z0000 Encounter for general adult medical examination without abnormal findings: Secondary | ICD-10-CM

## 2013-07-31 DIAGNOSIS — Z01419 Encounter for gynecological examination (general) (routine) without abnormal findings: Secondary | ICD-10-CM

## 2013-07-31 DIAGNOSIS — R946 Abnormal results of thyroid function studies: Secondary | ICD-10-CM

## 2013-07-31 DIAGNOSIS — R7989 Other specified abnormal findings of blood chemistry: Secondary | ICD-10-CM

## 2013-07-31 LAB — COMPREHENSIVE METABOLIC PANEL
ALT: 10 U/L (ref 0–35)
AST: 13 U/L (ref 0–37)
Albumin: 4.2 g/dL (ref 3.5–5.2)
Alkaline Phosphatase: 51 U/L (ref 39–117)
BUN: 11 mg/dL (ref 6–23)
CO2: 28 mEq/L (ref 19–32)
Calcium: 9 mg/dL (ref 8.4–10.5)
Chloride: 105 mEq/L (ref 96–112)
Creat: 0.61 mg/dL (ref 0.50–1.10)
Glucose, Bld: 69 mg/dL — ABNORMAL LOW (ref 70–99)
Potassium: 4.2 mEq/L (ref 3.5–5.3)
Sodium: 141 mEq/L (ref 135–145)
Total Bilirubin: 0.3 mg/dL (ref 0.3–1.2)
Total Protein: 6.2 g/dL (ref 6.0–8.3)

## 2013-07-31 LAB — CBC
HCT: 39.8 % (ref 36.0–46.0)
Hemoglobin: 13.4 g/dL (ref 12.0–15.0)
MCH: 30.9 pg (ref 26.0–34.0)
MCHC: 33.7 g/dL (ref 30.0–36.0)
MCV: 91.7 fL (ref 78.0–100.0)
Platelets: 338 10*3/uL (ref 150–400)
RBC: 4.34 MIL/uL (ref 3.87–5.11)
RDW: 13.8 % (ref 11.5–15.5)
WBC: 5.2 10*3/uL (ref 4.0–10.5)

## 2013-07-31 LAB — POCT URINALYSIS DIPSTICK
Bilirubin, UA: NEGATIVE
Blood, UA: NEGATIVE
Glucose, UA: NEGATIVE
Ketones, UA: NEGATIVE
Leukocytes, UA: NEGATIVE
Nitrite, UA: NEGATIVE
Protein, UA: NEGATIVE
Urobilinogen, UA: NEGATIVE
pH, UA: 5

## 2013-07-31 LAB — THYROID PANEL WITH TSH
Free Thyroxine Index: 3.2 (ref 1.0–3.9)
T3 Uptake: 37.7 % — ABNORMAL HIGH (ref 22.5–37.0)
T4, Total: 8.4 ug/dL (ref 5.0–12.5)
TSH: 1.062 u[IU]/mL (ref 0.350–4.500)

## 2013-07-31 LAB — LIPID PANEL
Cholesterol: 164 mg/dL (ref 0–200)
HDL: 52 mg/dL (ref >39)
LDL Cholesterol: 94 mg/dL (ref 0–99)
Total CHOL/HDL Ratio: 3.2 Ratio
Triglycerides: 91 mg/dL (ref <150)
VLDL: 18 mg/dL (ref 0–40)

## 2013-07-31 LAB — HEMOGLOBIN, FINGERSTICK: Hemoglobin, fingerstick: 13.2 g/dL (ref 12.0–16.0)

## 2013-07-31 NOTE — Progress Notes (Signed)
Patient ID: [name], female   DOB: 13-Sep-1970, 43 y.o.   MRN: [id number] GYNECOLOGY VISIT  PCP:   Carol Ada, MD  Referring provider:   HPI: 43 y.o.   Single  Caucasian  female   [id number] with Patient's last menstrual period was 07/17/2013.   here for  AEX.  Spots every now and then.  Very happy with ablation. Having rectal spasms.   Urinary frequency - 3 - 4 times a night.   Seeing Hart Robinsons at [facility]. Pain in right lower quadrant. All of pain improved with physical therapy.   Hgb:    13.2 Urine:  Neg  GYNECOLOGIC HISTORY: Patient's last menstrual period was 07/17/2013. Sexually active:  yes Partner preference: female Contraception:  ablation Menopausal hormone therapy: n/a DES exposure:   no Blood transfusions:   no Sexually transmitted diseases:  no  GYN Procedures:  Novasure ablation 05/2011, D & C with laparoscopic left ovarian cystectomy.  Path showed leiomyoma and hemorrhagic ovarian cyst. Mammogram: 05/2011 wnl:[facility]                Pap:   2 years ago wnl.  No HPV testing done.  History of abnormal pap smear:  no   OB History   Grav Para Term Preterm Abortions TAB SAB Ect Mult Living   1 1 1       1        LIFESTYLE: Exercise:    Weights, walking           Tobacco:    no Alcohol:      occ. Drug use:   no  OTHER HEALTH MAINTENANCE: Tetanus/TDap:    Up to date with work Gardisil:               n/a Influenza:             04/2013 Zostavax:              never  Bone density:      never Colonoscopy:      never  Cholesterol check:   unsure  Family History  Problem Relation Age of Onset  . Fibromyalgia Mother   . Thyroid disease Mother   . Cancer Paternal Grandmother     colon  . Hypertension Paternal Grandmother   . Stroke Paternal Grandmother   . Seizures Father     d/t head injury  . Diabetes Paternal Aunt   . Hypertension Maternal Grandmother   . Thyroid disease Maternal Grandmother     There are no active problems to  display for this patient.  Past Medical History  Diagnosis Date  . Irregular heart beat     history - no treatment  Dr Wynonia Lawman  . Hyperthyroidism     no meds  . GERD (gastroesophageal reflux disease)   . Headache(784.0)     otc meds prn  . PONV (postoperative nausea and vomiting)   . Sinus tachycardia   . Urinary incontinence     urgency and night urination    Past Surgical History  Procedure Laterality Date  . Anterior and posterior repair    . Incontinence surgery    . Nose surgery    . Shoulder surgery      bilateral left x 2, right x 1 surgery  . Upper gastrointestinal endoscopy    . Svd       x 1  . Dilation and curettage of uterus    . Diagnostic laparoscopy    .  Wisdom tooth extraction    . Laparoscopy  06/12/2011    Procedure: LAPAROSCOPY OPERATIVE;  Surgeon: Arloa Koh;  Location: [facility];  Service: Gynecology;  Laterality: N/A;   Left Ovarian Cystectomy  . Endometrial ablation  05/2011    ALLERGIES: Codeine; Iohexol; Morphine and related; and Vicodin  Current Outpatient Prescriptions  Medication Sig Dispense Refill  . Cyanocobalamin (VITAMIN B12 PO) Take 1 each by mouth daily. gummie        . lisdexamfetamine (VYVANSE) 20 MG capsule Take 20 mg by mouth as needed.      . Multiple Vitamins-Minerals (MULTIVITAMIN WITH MINERALS) tablet Take 1 tablet by mouth daily.        Marland Kitchen omeprazole-sodium bicarbonate (ZEGERID) 40-1100 MG per capsule Take 1 capsule by mouth daily before breakfast.         No current facility-administered medications for this visit.     ROS:  Pertinent items are noted in HPI.  SOCIAL HISTORY:  Nurse.  PHYSICAL EXAMINATION:    BP 100/58  Pulse 90  Resp 16  Ht 5\' 3"  (1.6 m)  Wt 119 lb 8 oz (54.205 kg)  BMI 21.17 kg/m2  LMP 07/17/2013   Wt Readings from Last 3 Encounters:  07/31/13 119 lb 8 oz (54.205 kg)  06/21/13 118 lb (53.524 kg)  06/02/11 119 lb (53.978 kg)     Ht Readings from Last 3 Encounters:  07/31/13 5\' 3"  (1.6 m)   06/21/13 5\' 3"  (1.6 m)  06/02/11 5\' 3"  (1.6 m)    General appearance: alert, cooperative and appears stated age Head: Normocephalic, without obvious abnormality, atraumatic Neck: no adenopathy, supple, symmetrical, trachea midline and thyroid not enlarged, symmetric, no tenderness/mass/nodules Lungs: clear to auscultation bilaterally Breasts: Inspection negative, No nipple retraction or dimpling, No nipple discharge or bleeding, No axillary or supraclavicular adenopathy, Normal to palpation without dominant masses Heart: regular rate and rhythm Abdomen: soft, non-tender; no masses,  no organomegaly Extremities: extremities normal, atraumatic, no cyanosis or edema Skin: Skin color, texture, turgor normal. No rashes or lesions Lymph nodes: Cervical, supraclavicular, and axillary nodes normal. No abnormal inguinal nodes palpated Neurologic: Grossly normal  Pelvic: External genitalia:  no lesions              Urethra:  normal appearing urethra with no masses, tenderness or lesions              Bartholins and Skenes: normal                 Vagina: normal appearing vagina with normal color and discharge, no lesions.  Minimal cystocele and rectocele.              Cervix: normal appearance              Pap and high risk HPV testing done: yes.            Bimanual Exam:  Uterus:  uterus is normal size, shape, consistency and nontender                                      Adnexa: normal adnexa in size, nontender and no masses                                      Rectovaginal: Confirms  Anus:  normal sphincter tone, no lesions  ASSESSMENT  Normal gynecologic exam. Pelvic pain. History of endometriosis. Status post ablation.   PLAN  M[facility]ogram at [facility].  Patient will call. Pap smear and high risk HPV testing perofrmed. Lipid profile, CBC, CMP, TFTs. Return for pelvic ultrasound if pain does not continue to improve. Return annually or prn   An  After Visit Summary was printed and given to the patient.

## 2013-07-31 NOTE — Patient Instructions (Signed)
EXERCISE AND DIET:  We recommended that you start or continue a regular exercise program for good health. Regular exercise means any activity that makes your heart beat faster and makes you sweat.  We recommend exercising at least 30 minutes per day at least 3 days a week, preferably 4 or 5.  We also recommend a diet low in fat and sugar.  Inactivity, poor dietary choices and obesity can cause diabetes, heart attack, stroke, and kidney damage, among others.    ALCOHOL AND SMOKING:  Women should limit their alcohol intake to no more than 7 drinks/beers/glasses of wine (combined, not each!) per week. Moderation of alcohol intake to this level decreases your risk of breast cancer and liver damage. And of course, no recreational drugs are part of a healthy lifestyle.  And absolutely no smoking or even second hand smoke. Most people know smoking can cause heart and lung diseases, but did you know it also contributes to weakening of your bones? Aging of your skin?  Yellowing of your teeth and nails?  CALCIUM AND VITAMIN D:  Adequate intake of calcium and Vitamin D are recommended.  The recommendations for exact amounts of these supplements seem to change often, but generally speaking 600 mg of calcium (either carbonate or citrate) and 800 units of Vitamin D per day seems prudent. Certain women may benefit from higher intake of Vitamin D.  If you are among these women, your doctor will have told you during your visit.    PAP SMEARS:  Pap smears, to check for cervical cancer or precancers,  have traditionally been done yearly, although recent scientific advances have shown that most women can have pap smears less often.  However, every woman still should have a physical exam from her gynecologist every year. It will include a breast check, inspection of the vulva and vagina to check for abnormal growths or skin changes, a visual exam of the cervix, and then an exam to evaluate the size and shape of the uterus and  ovaries.  And after 45 years of age, a rectal exam is indicated to check for rectal cancers. We will also provide age appropriate advice regarding health maintenance, like when you should have certain vaccines, screening for sexually transmitted diseases, bone density testing, colonoscopy, mammograms, etc.   MAMMOGRAMS:  All women over 40 years old should have a yearly mammogram. Many facilities now offer a "3D" mammogram, which may cost around $50 extra out of pocket. If possible,  we recommend you accept the option to have the 3D mammogram performed.  It both reduces the number of women who will be called back for extra views which then turn out to be normal, and it is better than the routine mammogram at detecting truly abnormal areas.    COLONOSCOPY:  Colonoscopy to screen for colon cancer is recommended for all women at age 50.  We know, you hate the idea of the prep.  We agree, BUT, having colon cancer and not knowing it is worse!!  Colon cancer so often starts as a polyp that can be seen and removed at colonscopy, which can quite literally save your life!  And if your first colonoscopy is normal and you have no family history of colon cancer, most women don't have to have it again for 10 years.  Once every ten years, you can do something that may end up saving your life, right?  We will be happy to help you get it scheduled when you are ready.    Be sure to check your insurance coverage so you understand how much it will cost.  It may be covered as a preventative service at no cost, but you should check your particular policy.     Mirabegron extended-release tablets What is this medicine? MIRABEGRON (MIR a BEG ron) is used to treat overactive bladder. This medicine reduces the amount of bathroom visits. It may also help to control wetting accidents. This medicine may be used for other purposes; ask your health care provider or pharmacist if you have questions. COMMON BRAND NAME(S): Myrbetriq What  should I tell my health care provider before I take this medicine? They need to know if you have any of these conditions: -difficulty passing urine -high blood pressure -kidney disease -liver disease -an unusual or allergic reaction to mirabegron, other medicines, foods, dyes, or preservatives -pregnant or trying to get pregnant -breast-feeding How should I use this medicine? Take this medicine by mouth with a glass of water. Follow the directions on the prescription label. Do not cut, crush or chew this medicine. You can take it with or without food. If it upsets your stomach, take it with food. Take your medicine at regular intervals. Do not take it more often than directed. Do not stop taking except on your doctor's advice. Talk to your pediatrician regarding the use of this medicine in children. Special care may be needed. Overdosage: If you think you've taken too much of this medicine contact a poison control center or emergency room at once. Overdosage: If you think you have taken too much of this medicine contact a poison control center or emergency room at once. NOTE: This medicine is only for you. Do not share this medicine with others. What if I miss a dose? If you miss a dose, take it as soon as you can. If it is almost time for your next dose, take only that dose. Do not take double or extra doses. What may interact with this medicine? -certain medicines for bladder problems like fesoterodine, oxybutynin, solifenacin, tolterodine -desipramine -digoxin -flecainide -ketoconazole -MAOIs like Carbex, Eldepryl, Marplan, Nardil, and Parnate -metoprolol -propafenone -thioridazine -warfarin This list may not describe all possible interactions. Give your health care provider a list of all the medicines, herbs, non-prescription drugs, or dietary supplements you use. Also tell them if you smoke, drink alcohol, or use illegal drugs. Some items may interact with your medicine. What should  I watch for while using this medicine? It may take 8 weeks to notice the full benefit from this medicine. You may need to limit your intake tea, coffee, caffeinated sodas, and alcohol. These drinks may make your symptoms worse. Visit your doctor or health care professional for regular checks on your progress. Check your blood pressure as directed. Ask your doctor or health care professional what your blood pressure should be and when you should contact him or her. What side effects may I notice from receiving this medicine? Side effects that you should report to your doctor or health care professional as soon as possible: -allergic reactions like skin rash, itching or hives, swelling of the face, lips, or tongue -chest pain or palpitations -severe or sudden headache -high blood pressure -fast, irregular heartbeat -redness, blistering, peeling or loosening of the skin, including inside the mouth -signs of infection - fever or chills, pain or difficulty passing urine -trouble passing urine or change in the amount of urine Side effects that usually do not require medical attention (Report these to your doctor or health  care professional if they continue or are bothersome.): -constipation -dry eyes -joint pain -mild headache -nausea -runny nose This list may not describe all possible side effects. Call your doctor for medical advice about side effects. You may report side effects to FDA at 1-800-FDA-1088. Where should I keep my medicine? Keep out of the reach of children. Store at room temperature between 15 and 30 degrees C (59 and 86 degrees F). Throw away any unused medicine after the expiration date. NOTE: This sheet is a summary. It may not cover all possible information. If you have questions about this medicine, talk to your doctor, pharmacist, or health care provider.  2014, Elsevier/Gold Standard. (2012-03-25 15:59:47)

## 2013-08-02 LAB — IPS PAP TEST WITH HPV

## 2013-10-19 DIAGNOSIS — B351 Tinea unguium: Secondary | ICD-10-CM

## 2013-10-24 ENCOUNTER — Other Ambulatory Visit (HOSPITAL_COMMUNITY): Payer: Self-pay | Admitting: Oral Surgery

## 2013-10-24 DIAGNOSIS — M2740 Unspecified cyst of jaw: Secondary | ICD-10-CM

## 2013-11-01 ENCOUNTER — Ambulatory Visit (HOSPITAL_COMMUNITY): Payer: 59

## 2013-11-06 ENCOUNTER — Ambulatory Visit (HOSPITAL_COMMUNITY)
Admission: RE | Admit: 2013-11-06 | Discharge: 2013-11-06 | Disposition: A | Discharge status: Home / Self Care | Payer: 59 | Source: Ambulatory Visit | Attending: Oral Surgery | Admitting: Oral Surgery

## 2013-11-06 DIAGNOSIS — R599 Enlarged lymph nodes, unspecified: Secondary | ICD-10-CM | POA: Insufficient documentation

## 2013-11-06 DIAGNOSIS — M26629 Arthralgia of temporomandibular joint, unspecified side: Secondary | ICD-10-CM | POA: Insufficient documentation

## 2013-11-06 DIAGNOSIS — M2740 Unspecified cyst of jaw: Secondary | ICD-10-CM

## 2014-02-01 ENCOUNTER — Other Ambulatory Visit: Payer: Self-pay | Admitting: Dermatology

## 2014-04-04 ENCOUNTER — Telehealth: Payer: Self-pay | Admitting: Obstetrics and Gynecology

## 2014-04-04 NOTE — Telephone Encounter (Signed)
Patient states she has been having hot flashes and insomnia. Pt has no tolerance for anything anymore gets very irritable.

## 2014-04-04 NOTE — Telephone Encounter (Signed)
Spoke with patient. Patient states that for a couple of months she has been having "decreased sleep. I just lay there and lay there. I am having more hot flashes and more irritability. Over the last week it has gotten intolerable." Patient requesting appointment for tomorrow. Appointment scheduled for tomorrow at 3:15pm with Dr.Silva. Requesting if anything opens up for a morning appointment that appointment be moved. Advised I would send a message to the front so that if there are any cancellations she can be called to see if she can take an earlier appointment. Patient agreeable.  Cc: Leeroy Bock  Routing to provider for final review. Patient agreeable to disposition. Will close encounter

## 2014-04-05 ENCOUNTER — Ambulatory Visit: Payer: 59 | Admitting: Obstetrics and Gynecology

## 2014-04-05 ENCOUNTER — Encounter: Payer: Self-pay | Admitting: Obstetrics and Gynecology

## 2014-04-05 ENCOUNTER — Ambulatory Visit (INDEPENDENT_AMBULATORY_CARE_PROVIDER_SITE_OTHER): Payer: 59 | Admitting: Obstetrics and Gynecology

## 2014-04-05 ENCOUNTER — Telehealth: Payer: Self-pay | Admitting: Obstetrics and Gynecology

## 2014-04-05 VITALS — BP 100/68 | HR 84 | Ht 63.0 in | Wt 120.4 lb

## 2014-04-05 DIAGNOSIS — R5381 Other malaise: Secondary | ICD-10-CM

## 2014-04-05 DIAGNOSIS — R5383 Other fatigue: Secondary | ICD-10-CM

## 2014-04-05 DIAGNOSIS — M2550 Pain in unspecified joint: Secondary | ICD-10-CM

## 2014-04-05 DIAGNOSIS — Z1239 Encounter for other screening for malignant neoplasm of breast: Secondary | ICD-10-CM

## 2014-04-05 DIAGNOSIS — N951 Menopausal and female climacteric states: Secondary | ICD-10-CM

## 2014-04-05 LAB — CBC
HCT: 40.8 % (ref 36.0–46.0)
Hemoglobin: 14.2 g/dL (ref 12.0–15.0)
MCH: 31.5 pg (ref 26.0–34.0)
MCHC: 34.8 g/dL (ref 30.0–36.0)
MCV: 90.5 fL (ref 78.0–100.0)
Platelets: 390 10*3/uL (ref 150–400)
RBC: 4.51 MIL/uL (ref 3.87–5.11)
RDW: 14 % (ref 11.5–15.5)
WBC: 8.3 10*3/uL (ref 4.0–10.5)

## 2014-04-05 LAB — RHEUMATOID FACTOR: Rhuematoid fact SerPl-aCnc: <10 IU/mL (ref <=14)

## 2014-04-05 LAB — COMPREHENSIVE METABOLIC PANEL
ALT: 9 U/L (ref 0–35)
AST: 12 U/L (ref 0–37)
Albumin: 4.6 g/dL (ref 3.5–5.2)
Alkaline Phosphatase: 52 U/L (ref 39–117)
BUN: 12 mg/dL (ref 6–23)
CO2: 25 mEq/L (ref 19–32)
Calcium: 9.6 mg/dL (ref 8.4–10.5)
Chloride: 104 mEq/L (ref 96–112)
Creat: 0.68 mg/dL (ref 0.50–1.10)
Glucose, Bld: 86 mg/dL (ref 70–99)
Potassium: 4.1 mEq/L (ref 3.5–5.3)
Sodium: 136 mEq/L (ref 135–145)
Total Bilirubin: 0.5 mg/dL (ref 0.2–1.2)
Total Protein: 7.2 g/dL (ref 6.0–8.3)

## 2014-04-05 NOTE — Progress Notes (Signed)
Patient ID: [name], female   DOB: June 04, 1971, 43 y.o.   MRN: [id number] GYNECOLOGY VISIT  PCP:  Carol Ada, MD  Referring provider:   HPI: 43 y.o.   Single  Caucasian  female   [id number] with Patient's last menstrual period was 03/14/2014.   here for  Evaluation of night sweats, moodiness and fatigue. Hot flashes at night.  Not sleeping well.  Has joint nodules.  Pain in the joints as well.   Has some cramping.  Has spotting off and on.  Has less patience.   Sister and grandmother with rheumatoid arthritis.   Overdue for mammogram.  Has taken combined oral contraceptives in past with no problem.  No history of migraines.   GYNECOLOGIC HISTORY: Patient's last menstrual period was 03/14/2014. Sexually active: yes Partner preference: female Contraception:  Novasure ablation  Menopausal hormone therapy: n/a DES exposure: no Blood transfusions:   no Sexually transmitted diseases:  no GYN procedures and prior surgeries:  Novasure ablation 05/2011, D & C with Laparoscopic left ovarian cystectomy Last mammogram:   05/2011 wnl:[facility]              Last pap and high risk HPV testing:  07-31-13 wnl:neg HR HPV History of abnormal pap smear:  no   OB History   Grav Para Term Preterm Abortions TAB SAB Ect Mult Living   1 1 1       1        LIFESTYLE: Exercise:               Tobacco: no Alcohol:   occ Drug use:  no  There are no active problems to display for this patient.   Past Medical History  Diagnosis Date  . Irregular heart beat     history - no treatment  Dr Wynonia Lawman  . Hyperthyroidism     no meds  . GERD (gastroesophageal reflux disease)   . Headache(784.0)     otc meds prn  . PONV (postoperative nausea and vomiting)   . Sinus tachycardia   . Urinary incontinence     urgency and night urination    Past Surgical History  Procedure Laterality Date  . Anterior and posterior repair    . Incontinence surgery    . Nose surgery    . Shoulder  surgery      bilateral left x 2, right x 1 surgery  . Upper gastrointestinal endoscopy    . Svd       x 1  . Dilation and curettage of uterus    . Diagnostic laparoscopy    . Wisdom tooth extraction    . Laparoscopy  06/12/2011    Procedure: LAPAROSCOPY OPERATIVE;  Surgeon: Arloa Koh;  Location: [facility];  Service: Gynecology;  Laterality: N/A;   Left Ovarian Cystectomy  . Endometrial ablation  05/2011    Current Outpatient Prescriptions  Medication Sig Dispense Refill  . Cyanocobalamin (VITAMIN B12 PO) Take 1 each by mouth as needed. gummie       . lisdexamfetamine (VYVANSE) 20 MG capsule Take 20 mg by mouth as needed.      . Multiple Vitamins-Minerals (MULTIVITAMIN WITH MINERALS) tablet Take 1 tablet by mouth daily.        Marland Kitchen omeprazole-sodium bicarbonate (ZEGERID) 40-1100 MG per capsule Take 1 capsule by mouth daily before breakfast.         No current facility-administered medications for this visit.     ALLERGIES: Gadolinium derivatives; Codeine; Iohexol; Morphine  and related; and Vicodin  Family History  Problem Relation Age of Onset  . Fibromyalgia Mother   . Thyroid disease Mother   . Cancer Paternal Grandmother     colon  . Hypertension Paternal Grandmother   . Stroke Paternal Grandmother   . Seizures Father     d/t head injury  . Cancer Father     ?stomach ca  . Diabetes Paternal Aunt   . Hypertension Maternal Grandmother   . Thyroid disease Maternal Grandmother     History   Social History  . Marital Status: Single    Spouse Name: N/A    Number of Children: N/A  . Years of Education: N/A   Occupational History  . Not on file.   Social History Main Topics  . Smoking status: Never Smoker   . Smokeless tobacco: Never Used  . Alcohol Use: Yes     Comment: socially  . Drug Use: No  . Sexual Activity: Yes    Partners: Female     Comment: ablation-Novasure   Other Topics Concern  . Not on file   Social History Narrative  . No narrative on file     ROS:  Pertinent items are noted in HPI.  PHYSICAL EXAMINATION:    BP 100/68  Pulse 84  Ht 5\' 3"  (1.6 m)  Wt 120 lb 6.4 oz (54.613 kg)  BMI 21.33 kg/m2  LMP 03/14/2014   Wt Readings from Last 3 Encounters:  04/05/14 120 lb 6.4 oz (54.613 kg)  07/31/13 119 lb 8 oz (54.205 kg)  06/21/13 118 lb (53.524 kg)     Ht Readings from Last 3 Encounters:  04/05/14 5\' 3"  (1.6 m)  07/31/13 5\' 3"  (1.6 m)  06/21/13 5\' 3"  (1.6 m)    General appearance: alert, cooperative and appears stated age Hand:  Nodules of articulations of right fingers.  ASSESSMENT  Fagitue.  Hot flashes.  Joint pain and nodules.  Family history of rheumatoid arthritis.  Status post endometrial ablation.  Occasional spotting.   PLAN  Will check CBC, CMP, TFTs, ANA, Sed rate, Rheumatoid factor, FSH, and estradiol.  Discussed possible LoLoestrin vs. HRT. Referral to Rheumatology.   An After Visit Summary was printed and given to the patient.  25 minutes face to face time of which over 50% was spent in counseling.

## 2014-04-05 NOTE — Telephone Encounter (Signed)
LMTCB re: 1:00 open slot with Dr. Quincy Simmonds available for today, 04/05/14.

## 2014-04-05 NOTE — Progress Notes (Signed)
Patient scheduled for screening mammogram at [facility] for 04/06/14 at [place]. She is agreeable to time/date/location.

## 2014-04-06 ENCOUNTER — Ambulatory Visit
Admission: RE | Admit: 2014-04-06 | Discharge: 2014-04-06 | Disposition: A | Discharge status: Home / Self Care | Payer: 59 | Source: Ambulatory Visit | Attending: Obstetrics and Gynecology | Admitting: Obstetrics and Gynecology

## 2014-04-06 DIAGNOSIS — Z1239 Encounter for other screening for malignant neoplasm of breast: Secondary | ICD-10-CM

## 2014-04-06 LAB — FOLLICLE STIMULATING HORMONE: FSH: 5.7 m[IU]/mL

## 2014-04-06 LAB — THYROID PANEL WITH TSH
Free Thyroxine Index: 1.8 (ref 1.4–3.8)
T3 Uptake: 24 % (ref 22.0–35.0)
T4, Total: 7.7 ug/dL (ref 4.5–12.0)
TSH: 0.964 u[IU]/mL (ref 0.350–4.500)

## 2014-04-06 LAB — ANA: Anti Nuclear Antibody(ANA): NEGATIVE

## 2014-04-06 LAB — ESTRADIOL: Estradiol: 84.7 pg/mL

## 2014-04-06 LAB — SEDIMENTATION RATE: Sed Rate: 1 mm/hr (ref 0–22)

## 2014-04-19 ENCOUNTER — Other Ambulatory Visit: Payer: Self-pay | Admitting: Obstetrics and Gynecology

## 2014-04-19 ENCOUNTER — Telehealth: Payer: Self-pay | Admitting: Obstetrics and Gynecology

## 2014-04-19 MED ORDER — NORETHIN-ETH ESTRAD-FE BIPHAS 1 MG-10 MCG / 10 MCG PO TABS: 1.0000 | ORAL_TABLET | Freq: Every day | ORAL | Status: DC

## 2014-04-19 NOTE — Telephone Encounter (Signed)
I recommend starting with LoLoEstrin OCPs.  This is an ultra low dose birth control pill with estrogen and progesterone.   This should really help with hot flashes and improve sleep.  If sleep is better, the mood swings may be better as well.   Office visit in 3 months for a recheck.   I will send Rx to her pharmacy.

## 2014-04-19 NOTE — Telephone Encounter (Signed)
Patient has some questions her recent lab results. Patient is asking which medication she is supposed to take?

## 2014-04-19 NOTE — Telephone Encounter (Signed)
Spoke with patient. Patient is agreeable to started LoLoEstrin. Will start taking when she returns to town next Monday and will call to schedule 3 month recheck.  Routing to provider for final review. Patient agreeable to disposition. Will close encounter

## 2014-04-19 NOTE — Telephone Encounter (Signed)
Spoke with patient. Patient received mychart message from [place] regarding labs and recommended medications she could start. Patient would like to know which medication Dr.Silva recommends the most. "I want to know what she thinks will work the best for me. I have taken antidepressants before and all I wanted to do was sleep. With a birth control I am worried about weight gain. She knows me really well and I want her opinion." Advised patient would send a message over to Dr.Silva and give patient a call back with recommendations and advice. Patient agreeable.  Dr.Silva, in mychart message you recommended low dose OCP, brisdelle, or zoloft for mood swings and hot flashes.

## 2014-05-11 ENCOUNTER — Other Ambulatory Visit: Payer: Self-pay

## 2014-05-28 ENCOUNTER — Encounter: Payer: Self-pay | Admitting: Obstetrics and Gynecology

## 2014-07-27 DIAGNOSIS — N83201 Unspecified ovarian cyst, right side: Secondary | ICD-10-CM

## 2014-07-27 HISTORY — DX: Unspecified ovarian cyst, right side: N83.201

## 2014-09-26 ENCOUNTER — Other Ambulatory Visit (HOSPITAL_COMMUNITY): Payer: Self-pay | Admitting: Gastroenterology

## 2014-09-26 DIAGNOSIS — R109 Unspecified abdominal pain: Secondary | ICD-10-CM

## 2014-09-27 ENCOUNTER — Ambulatory Visit (HOSPITAL_COMMUNITY)
Admission: RE | Admit: 2014-09-27 | Discharge: 2014-09-27 | Disposition: A | Discharge status: Home / Self Care | Payer: 59 | Source: Ambulatory Visit | Attending: Gastroenterology | Admitting: Gastroenterology

## 2014-09-27 DIAGNOSIS — R109 Unspecified abdominal pain: Secondary | ICD-10-CM | POA: Insufficient documentation

## 2014-10-15 ENCOUNTER — Telehealth: Payer: 59 | Admitting: Physician Assistant

## 2014-10-15 DIAGNOSIS — B9689 Other specified bacterial agents as the cause of diseases classified elsewhere: Secondary | ICD-10-CM

## 2014-10-15 DIAGNOSIS — J019 Acute sinusitis, unspecified: Principal | ICD-10-CM

## 2014-10-16 MED ORDER — AMOXICILLIN-POT CLAVULANATE 875-125 MG PO TABS: 1.0000 | ORAL_TABLET | Freq: Two times a day (BID) | ORAL | Status: DC

## 2014-10-16 MED ORDER — DOXYCYCLINE HYCLATE 100 MG PO CAPS: 100.0000 mg | ORAL_CAPSULE | Freq: Two times a day (BID) | ORAL | Status: DC

## 2014-10-16 NOTE — Progress Notes (Signed)
Duplicate encounter

## 2014-10-16 NOTE — Progress Notes (Signed)

## 2014-10-16 NOTE — Addendum Note (Signed)
Addended by: Raiford Noble on: 10/16/2014 07:42 AM   Modules accepted: Orders

## 2015-03-25 ENCOUNTER — Encounter: Payer: Self-pay | Admitting: Obstetrics and Gynecology

## 2015-03-25 ENCOUNTER — Telehealth: Payer: Self-pay | Admitting: Obstetrics and Gynecology

## 2015-03-25 ENCOUNTER — Ambulatory Visit (INDEPENDENT_AMBULATORY_CARE_PROVIDER_SITE_OTHER): Payer: 59 | Admitting: Obstetrics and Gynecology

## 2015-03-25 VITALS — BP 108/78 | HR 84 | Resp 14 | Wt 124.6 lb

## 2015-03-25 DIAGNOSIS — R1032 Left lower quadrant pain: Secondary | ICD-10-CM | POA: Diagnosis not present

## 2015-03-25 DIAGNOSIS — R112 Nausea with vomiting, unspecified: Secondary | ICD-10-CM | POA: Diagnosis not present

## 2015-03-25 DIAGNOSIS — R312 Other microscopic hematuria: Secondary | ICD-10-CM

## 2015-03-25 DIAGNOSIS — R3129 Other microscopic hematuria: Secondary | ICD-10-CM

## 2015-03-25 LAB — POCT URINALYSIS DIPSTICK
Bilirubin, UA: NEGATIVE
Glucose, UA: NEGATIVE
Ketones, UA: NEGATIVE
Leukocytes, UA: NEGATIVE
Nitrite, UA: NEGATIVE
Protein, UA: NEGATIVE
Urobilinogen, UA: NEGATIVE
pH, UA: 5

## 2015-03-25 NOTE — Progress Notes (Signed)
GYNECOLOGY  VISIT   HPI: 44 y.o.   Partnered.  Caucasian  female   [id number] with Patient's last menstrual period was 02/24/2014 (approximate).   here for Left groin pain.  Pain woke patient up.    Can feel like twinges and is associated with nausea.  Vomiting if pain is bad enough.  Pain radiates into her back.  Took Advil and Percocet that she had at home.   No real change in bowel function.   Spots every so often.  Is having pain again when spotting occurs. This went away after the ablation but the pain is recurring.  Pain is bad enough to cause nausea.  Has menstrual headaches.   Status post endometrial ablation.  Off LoLoEstrin for hot flashes due to nausea from them.   Hx of renal cyst.  No dysuria.  Has frequency which is not unusual for her.  No hx of renal stones.   Female partner.   Urine - trace RBCs.   GYNECOLOGIC HISTORY: Patient's last menstrual period was 02/24/2014 (approximate). Contraception: none Menopausal hormone therapy: none Last mammogram: 04/06/14 3D, dense category c, Bi-rads category 1 neg. Last pap smear: 07/31/13 normal, HR HPV neg.        OB History    Gravida Para Term Preterm AB TAB SAB Ectopic Multiple Living   1 1 1       1          There are no active problems to display for this patient.   Past Medical History  Diagnosis Date  . Irregular heart beat     history - no treatment  Dr Wynonia Lawman  . Hyperthyroidism     no meds  . GERD (gastroesophageal reflux disease)   . Headache(784.0)     otc meds prn  . PONV (postoperative nausea and vomiting)   . Sinus tachycardia   . Urinary incontinence     urgency and night urination    Past Surgical History  Procedure Laterality Date  . Anterior and posterior repair    . Incontinence surgery    . Nose surgery    . Shoulder surgery      bilateral left x 2, right x 1 surgery  . Upper gastrointestinal endoscopy    . Svd       x 1  . Dilation and curettage of uterus    . Diagnostic  laparoscopy    . Wisdom tooth extraction    . Laparoscopy  06/12/2011    Procedure: LAPAROSCOPY OPERATIVE;  Surgeon: Arloa Koh;  Location: [facility];  Service: Gynecology;  Laterality: N/A;   Left Ovarian Cystectomy  . Endometrial ablation  05/2011    Current Outpatient Prescriptions  Medication Sig Dispense Refill  . Cyanocobalamin (VITAMIN B12 PO) Take 1 each by mouth as needed. gummie     . doxycycline (VIBRAMYCIN) 100 MG capsule Take 1 capsule (100 mg total) by mouth 2 (two) times daily. 14 capsule 0  . lisdexamfetamine (VYVANSE) 20 MG capsule Take 20 mg by mouth as needed.    . Multiple Vitamins-Minerals (MULTIVITAMIN WITH MINERALS) tablet Take 1 tablet by mouth daily.      . Norethindrone-Ethinyl Estradiol-Fe Biphas (LO LOESTRIN FE) 1 MG-10 MCG / 10 MCG tablet Take 1 tablet by mouth daily. 1 Package 3  . omeprazole-sodium bicarbonate (ZEGERID) 40-1100 MG per capsule Take 1 capsule by mouth daily before breakfast.       No current facility-administered medications for this visit.     ALLERGIES:  Gadolinium derivatives; Codeine; Iohexol; Morphine and related; and Vicodin  Family History  Problem Relation Age of Onset  . Fibromyalgia Mother   . Thyroid disease Mother   . Cancer Paternal Grandmother     colon  . Hypertension Paternal Grandmother   . Stroke Paternal Grandmother   . Seizures Father     d/t head injury  . Cancer Father     ?stomach ca  . Diabetes Paternal Aunt   . Hypertension Maternal Grandmother   . Thyroid disease Maternal Grandmother     Social History   Social History  . Marital Status: Single    Spouse Name: N/A  . Number of Children: N/A  . Years of Education: N/A   Occupational History  . Not on file.   Social History Main Topics  . Smoking status: Never Smoker   . Smokeless tobacco: Never Used  . Alcohol Use: Yes     Comment: socially  . Drug Use: No  . Sexual Activity:    Partners: Female     Comment: ablation-Novasure   Other  Topics Concern  . Not on file   Social History Narrative    ROS:  Pertinent items are noted in HPI.  PHYSICAL EXAMINATION:    There were no vitals taken for this visit.    General appearance: alert, cooperative and appears stated age Lungs: clear to auscultation bilaterally Heart: regular rate and rhythm Abdomen: soft, non-tender; no masses,  no organomegaly Back:  No CVA tenderness. No abnormal inguinal nodes palpated Neurologic: Grossly normal  Pelvic: External genitalia:  no lesions              Urethra:  normal appearing urethra with no masses, tenderness or lesions              Bartholins and Skenes: normal                 Vagina: normal appearing vagina with normal color and discharge, no lesions              Cervix:  has cervical polyp.  Not removed today.                Bimanual Exam:  Uterus:  normal size, contour, position, consistency, mobility, non-tender              Adnexa: normal adnexa and no mass, fullness, tenderness              Rectovaginal:  Confirms.   Chaperone was present for exam.  ASSESSMENT  Left groin pain.  Microscopic hematuria.  Nausea. Status post endometrial ablation.  Dysmenorrhea.  Low grade temp.   PLAN  Counseled regarding possible etiologies of pain - reproductive in origin versus urolithiasis.  Urine micro and culture.  Pelvic ultrasound.  If ultrasound is normal, proceed with CT scan of abdomen/pelvis - stone protocol.  An After Visit Summary was printed and given to the patient.  _25_____ minutes face to face time of which over 50% was spent in counseling.

## 2015-03-25 NOTE — Telephone Encounter (Signed)
Patient is "having a lot of pain and would like to be seen ASAP". Last seen 04/05/14.

## 2015-03-25 NOTE — Progress Notes (Signed)
Pelvic ultrasound scheduled for 03-28-15 at 1030 here in office. Initially patient requested at hospital due to work schedule and orders entered accordingly. Patient then decided to have ultrasound done at office.

## 2015-03-25 NOTE — Telephone Encounter (Signed)
Spoke with patient. Patient states that she has been experiencing left sided groin pain that radiates to her lower back. Pain is intermittent, but has been coming more frequently. Began two weeks ago. When pain occurs it causes patient to be nauseous. Had vomiting with nausea once with pain. Denies any fevers or chills. Denies any urinary symptoms. When the pain occurs patient states "I get like a hot flash and get really sweaty." Advised will need to be seen in office for further evaluation. Patient is agreeable. Appointment scheduled for today at 2 pm with Dr.Silva. Patient is agreeable to date and time.  Routing to provider for final review. Patient agreeable to disposition. Will close encounter.   Patient aware provider will review message and nurse will return call if any additional advice or change of disposition.

## 2015-03-26 ENCOUNTER — Telehealth: Payer: Self-pay | Admitting: Obstetrics and Gynecology

## 2015-03-26 LAB — URINALYSIS, MICROSCOPIC ONLY
Bacteria, UA: NONE SEEN [HPF]
Casts: NONE SEEN [LPF]
Crystals: NONE SEEN [HPF]
RBC / HPF: NONE SEEN RBC/HPF (ref <=2)
Squamous Epithelial / LPF: NONE SEEN [HPF] (ref <=5)
WBC, UA: NONE SEEN WBC/HPF (ref <=5)
Yeast: NONE SEEN [HPF]

## 2015-03-26 NOTE — Telephone Encounter (Signed)
Call to patient to advise of benefit information for ultrasound appointment on 03/28/15. Patient agreeable.   Routing to provider for final review. Patient agreeable to disposition. Will close encounter.

## 2015-03-27 LAB — URINE CULTURE
Colony Count: NO GROWTH
Organism ID, Bacteria: NO GROWTH

## 2015-03-28 ENCOUNTER — Telehealth: Payer: Self-pay

## 2015-03-28 ENCOUNTER — Ambulatory Visit (INDEPENDENT_AMBULATORY_CARE_PROVIDER_SITE_OTHER): Payer: 59

## 2015-03-28 ENCOUNTER — Encounter: Payer: Self-pay | Admitting: Obstetrics and Gynecology

## 2015-03-28 ENCOUNTER — Ambulatory Visit (INDEPENDENT_AMBULATORY_CARE_PROVIDER_SITE_OTHER): Payer: 59 | Admitting: Obstetrics and Gynecology

## 2015-03-28 VITALS — BP 100/66 | HR 80 | Ht 63.0 in | Wt 125.0 lb

## 2015-03-28 DIAGNOSIS — R1032 Left lower quadrant pain: Secondary | ICD-10-CM | POA: Diagnosis not present

## 2015-03-28 DIAGNOSIS — R109 Unspecified abdominal pain: Secondary | ICD-10-CM

## 2015-03-28 NOTE — Progress Notes (Signed)
Subjective  44 y.o. G76P1001  Partnered Caucasian  female here for pelvic ultrasound for left groin pain.   Pain woke patient up again.  Comes on suddenly and is very intense. Radiates into her left flank.  Can have pain with menstruation but this usually lasts for 3 days or so and is treated with Advil.   Urine micro and culture negative.   Has a cervical polyp which I saw at her visit this week.   Has appointment for annual exam next month.   Wants polyp removal then.   Objective  Pelvic ultrasound images and report reviewed with patient.  Uterus - no masses. EMS - 2.6 mm.  Consistent with ablation. 2 solid areas of the cervix - 5 x 5 mm and adjacent to the external os 13 x 8 mm. (This was seen as an external cervical polyp at visit on 03/25/15.) Ovaries - WNL.  Free fluid - yes, scant free fluid.  Bladder - no stones.        Assessment   Left groin pain - colicky.  Status post endometrial ablation.  Cervical masses most consistent with polyps. Negative urine micro and culture.   Plan  Discussion of ultrasound findings.  We discussed that there can be potential for endometriosis which the ultrasound cannot identify.  Scar tissue is a possibility as well. Discussion of possible etiology of LLQ pain - ureteral stones, bowel.  Will proceed with a CT of the abdomen and pelvis - stone protocol.  If CT is negative, will have patient see GI.  Declines Rx for pain at this time.  Return for annual exam next month and do pap and removal of external os polyp at that time.   ___15____ minutes face to face time of which over 50% was spent in counseling.

## 2015-03-28 NOTE — Telephone Encounter (Signed)
Call to [facility] to schedule patient for CT abdomen pelvis without contrast to rule out kidney stones. Per receptionist first available is Tuesday 04/02/2015. Advised will need CT tomorrow before holiday weekend. Call to [facility] schedule appointment for tomorrow 03/29/2015 at 10:30 am. Patient will need to fast 4 hours prior to appointment. Order will need to be faxed to [phone]. Call to patient advised of appointment date and time. Patient declines to proceed with imaging at [facility] as this will be out of network for her insurance. Advised do not recommend waiting to have imaging performed. Patient states that her imaging will need to be performed at [facility]. Advised no appointment until Tuesday and do not encourage waiting. Patient again declines appointment for tomorrow. Is asking for imaging to be scheduled for Wednesday 04/03/2015 as she will be out of town and working until that date. Advised I will cancel her appointment with [facility] and reschedule with [facility] for Wednesday. Advised if her symptoms worsen she will need to be seen at a local emergency room while traveling. Patient is agreeable. Appointment cancelled for tomorrow with [facility]. Will call [facility] in the morning when they reopen.

## 2015-03-28 NOTE — Telephone Encounter (Signed)
Nunzio Cobbs, MD  Michele Mcalpine, RN           Candee Furbish,   My patient is having sudden onset left lower quadrant and left flank pain.   I would like for her to have a CT of the abdomen and pelvis specifically to rule out stones.   I would like for her to have this done this week due to her pain.   Thank you!   [place]

## 2015-03-28 NOTE — Telephone Encounter (Signed)
Thank you for all of your work to schedule this CT scan.  If patient wants to wait until next week, this is OK.  I was simply trying to facilitate due to her pain.  She does not have an acute abdomen.

## 2015-03-29 NOTE — Telephone Encounter (Signed)
Spoke with [facility] Ct abd pelvis wo contrast scheduled for 04/03/2015 at [place] at 3 pm. Spoke with patient. Patient is agreeable to date and time. Order will need to be precerted for appointment. Patient is concerned about this getting done. Assured this will be performed and she will be notified once it is completed. Patient is agreeable. Placed in imaging hold.  Cc: Theresia Lo  Routing to provider for final review. Patient agreeable to disposition. Will close encounter.

## 2015-04-03 ENCOUNTER — Ambulatory Visit (HOSPITAL_COMMUNITY)
Admission: RE | Admit: 2015-04-03 | Discharge: 2015-04-03 | Disposition: A | Discharge status: Home / Self Care | Payer: 59 | Source: Ambulatory Visit | Attending: Obstetrics and Gynecology | Admitting: Obstetrics and Gynecology

## 2015-04-03 ENCOUNTER — Inpatient Hospital Stay: Admission: RE | Admit: 2015-04-03 | Payer: Self-pay | Source: Ambulatory Visit

## 2015-04-03 DIAGNOSIS — N839 Noninflammatory disorder of ovary, fallopian tube and broad ligament, unspecified: Secondary | ICD-10-CM | POA: Diagnosis not present

## 2015-04-03 DIAGNOSIS — R1032 Left lower quadrant pain: Secondary | ICD-10-CM | POA: Diagnosis present

## 2015-04-03 DIAGNOSIS — K769 Liver disease, unspecified: Secondary | ICD-10-CM | POA: Diagnosis not present

## 2015-04-04 ENCOUNTER — Telehealth: Payer: Self-pay

## 2015-04-04 NOTE — Telephone Encounter (Signed)
Spoke with patient. Advised of results as seen below from [facility]. Patient is agreeable and verbalizes understanding. Patient states that she has a GI doctor that she sees yearly and will call to schedule an appointment. Will call if she needs anything or has any questions/concerns.  Routing to provider for final review. Patient agreeable to disposition. Will close encounter.

## 2015-04-04 NOTE — Telephone Encounter (Signed)
-----   Message from Nunzio Cobbs, MD sent at 04/03/2015  9:16 PM EDT ----- Please contact patient with results from CT scan: 1.  A 3 mm hypodense area of liver too small to characterize and likely to be benign cyst or hemangioma.  Appears unchanged from prior CT in 2012.   2.  A 1 cm area of right ovary looks like fatty tissue and consistent with possible small right ovarian dermoid cyst.  Recommendation made for follow up ultrasound of pelvis in one year to recheck this area.  This was not seen on pelvic ultrasound as a dermoid cyst.  It looked more like a simple cyst.   At this point, I recommend patient see gastroenterology.  Please make appointment for patient if she needs a referral.  I do not recall if she already has a GI.   Patient also needs appointment for her annual exam with me. Thanks!  Cc - Marisa Sprinkles

## 2015-04-08 ENCOUNTER — Other Ambulatory Visit: Payer: Self-pay

## 2015-04-16 ENCOUNTER — Telehealth: Payer: Self-pay | Admitting: Emergency Medicine

## 2015-04-16 NOTE — Telephone Encounter (Signed)
-----   Message from Nunzio Cobbs, MD sent at 04/11/2015  4:04 PM EDT ----- Regarding: RE: Imaging hold  OK to remove from imaging hold.   Please contact patient as a reminder for her annual exam.  This will also serve as a way to do a recheck on her pain.   Thank you,   Josefa Half ----- Message -----    From: Michele Mcalpine, RN    Sent: 04/11/2015   2:06 PM      To: Brook Oletta Lamas, MD Subject: Imaging hold                                   Dr. Quincy Simmonds,  Is patient okay to remove from imaging hold for CT abdomen that was scheduled for her?

## 2015-04-16 NOTE — Telephone Encounter (Signed)
Spoke with patinet. She states she has intermittent pain but generally is feeling better. Ready to schedule Annual exam.  Scheduled for 04/23/15 with Dr. Quincy Simmonds. Patient agreeable,.  Routing to provider for final review. Patient agreeable to disposition. Will close encounter.

## 2015-04-24 ENCOUNTER — Ambulatory Visit (INDEPENDENT_AMBULATORY_CARE_PROVIDER_SITE_OTHER): Payer: 59 | Admitting: Obstetrics and Gynecology

## 2015-04-24 ENCOUNTER — Encounter: Payer: Self-pay | Admitting: Obstetrics and Gynecology

## 2015-04-24 VITALS — BP 100/68 | HR 80 | Resp 18 | Ht 62.5 in | Wt 124.4 lb

## 2015-04-24 DIAGNOSIS — N841 Polyp of cervix uteri: Secondary | ICD-10-CM

## 2015-04-24 DIAGNOSIS — Z01419 Encounter for gynecological examination (general) (routine) without abnormal findings: Secondary | ICD-10-CM

## 2015-04-24 DIAGNOSIS — Z0000 Encounter for general adult medical examination without abnormal findings: Secondary | ICD-10-CM

## 2015-04-24 DIAGNOSIS — N951 Menopausal and female climacteric states: Secondary | ICD-10-CM

## 2015-04-24 LAB — LIPID PANEL
Cholesterol: 214 mg/dL — ABNORMAL HIGH (ref 125–200)
HDL: 51 mg/dL (ref >=46)
LDL Cholesterol: 143 mg/dL — ABNORMAL HIGH (ref <130)
Total CHOL/HDL Ratio: 4.2 Ratio (ref <=5.0)
Triglycerides: 102 mg/dL (ref <150)
VLDL: 20 mg/dL (ref <30)

## 2015-04-24 LAB — COMPREHENSIVE METABOLIC PANEL
ALT: 15 U/L (ref 6–29)
AST: 16 U/L (ref 10–35)
Albumin: 4.4 g/dL (ref 3.6–5.1)
Alkaline Phosphatase: 56 U/L (ref 33–115)
BUN: 14 mg/dL (ref 7–25)
CO2: 27 mmol/L (ref 20–31)
Calcium: 9.9 mg/dL (ref 8.6–10.2)
Chloride: 105 mmol/L (ref 98–110)
Creat: 0.76 mg/dL (ref 0.50–1.10)
Glucose, Bld: 87 mg/dL (ref 65–99)
Potassium: 4.2 mmol/L (ref 3.5–5.3)
Sodium: 140 mmol/L (ref 135–146)
Total Bilirubin: 0.3 mg/dL (ref 0.2–1.2)
Total Protein: 7 g/dL (ref 6.1–8.1)

## 2015-04-24 LAB — POCT URINALYSIS DIPSTICK
Bilirubin, UA: NEGATIVE
Blood, UA: NEGATIVE
Glucose, UA: NEGATIVE
Ketones, UA: NEGATIVE
Leukocytes, UA: NEGATIVE
Nitrite, UA: NEGATIVE
Protein, UA: NEGATIVE
Urobilinogen, UA: NEGATIVE
pH, UA: 5

## 2015-04-24 LAB — CBC
HCT: 41.2 % (ref 36.0–46.0)
Hemoglobin: 14.3 g/dL (ref 12.0–15.0)
MCH: 32.2 pg (ref 26.0–34.0)
MCHC: 34.7 g/dL (ref 30.0–36.0)
MCV: 92.8 fL (ref 78.0–100.0)
MPV: 9.9 fL (ref 8.6–12.4)
Platelets: 380 10*3/uL (ref 150–400)
RBC: 4.44 MIL/uL (ref 3.87–5.11)
RDW: 13.9 % (ref 11.5–15.5)
WBC: 8.1 10*3/uL (ref 4.0–10.5)

## 2015-04-24 LAB — TSH: TSH: 1.138 u[IU]/mL (ref 0.350–4.500)

## 2015-04-24 LAB — HEMOGLOBIN, FINGERSTICK: Hemoglobin, fingerstick: 14 g/dL (ref 12.0–16.0)

## 2015-04-24 NOTE — Progress Notes (Signed)
44 y.o. G41P1001 Single Caucasian female here for annual exam.   Has a known cervical polyp to be removed today as well.   LLQ pain.  Seems to be better with increased Zegerid and probiotics. Some rectal spasms. Will be also seeing GI.  Pelvic ultrasound 03/28/15: Uterus - no masses. EMS - 2.6 mm. Consistent with ablation. 2 solid areas of the cervix - 5 x 5 mm and adjacent to the external os 13 x 8 mm. (This was seen as an external cervical polyp at visit on 03/25/15.) Ovaries - WNL.  Free fluid - yes, scant free fluid.  Bladder - no stones.   CT abd/pelvis on 04/03/15 showing 1 cm right ovarian mass, possible dermoid.   Has cramping but no real bleeding.  Has spotting only.  Status post ablation.  Nausea with estrogen containing OCPs.  PCP:  Carol Ada, MD   Patient's last menstrual period was 03/10/2014 (exact date).          Sexually active: No.female partner  The current method of family planning is none/Novasure Ablation.    Exercising: Yes.    weights. Smoker:  no  Health Maintenance: Pap:  07/31/13 Neg. HR HPV:neg History of abnormal Pap:  no MMG:  04/06/14 3D Density Cat.[place]. Colonoscopy:  n/a BMD:   n/a  Result  n/a TDaP:  Up to date through [facility]:  Hb today: 14.0, Urine today: Neg   reports that she has never smoked. She has never used smokeless tobacco. She reports that she drinks alcohol. She reports that she does not use illicit drugs.  Past Medical History  Diagnosis Date  . Irregular heart beat     history - no treatment  Dr Wynonia Lawman  . Hyperthyroidism     no meds  . GERD (gastroesophageal reflux disease)   . Headache(784.0)     otc meds prn  . PONV (postoperative nausea and vomiting)   . Sinus tachycardia   . Urinary incontinence     urgency and night urination  . Right ovarian cyst 2016    CT scan - 1 cm, possible dermoid.    Past Surgical History  Procedure Laterality Date  . Anterior and  posterior repair    . Incontinence surgery    . Nose surgery    . Shoulder surgery      bilateral left x 2, right x 1 surgery  . Upper gastrointestinal endoscopy    . Svd       x 1  . Dilation and curettage of uterus    . Diagnostic laparoscopy    . Wisdom tooth extraction    . Laparoscopy  06/12/2011    Procedure: LAPAROSCOPY OPERATIVE;  Surgeon: Arloa Koh;  Location: [facility];  Service: Gynecology;  Laterality: N/A;   Left Ovarian Cystectomy  . Endometrial ablation  05/2011    Current Outpatient Prescriptions  Medication Sig Dispense Refill  . Cyanocobalamin (VITAMIN B12 PO) Take 1 each by mouth as needed. gummie     . lisdexamfetamine (VYVANSE) 20 MG capsule Take 20 mg by mouth as needed.    . Multiple Vitamins-Minerals (MULTIVITAMIN WITH MINERALS) tablet Take 1 tablet by mouth daily.      Marland Kitchen omeprazole-sodium bicarbonate (ZEGERID) 40-1100 MG per capsule Take 1 capsule by mouth daily before breakfast.       No current facility-administered medications for this visit.    Family History  Problem Relation Age of Onset  . Fibromyalgia Mother   .  Thyroid disease Mother   . Cancer Paternal Grandmother     colon  . Hypertension Paternal Grandmother   . Stroke Paternal Grandmother   . Seizures Father     d/t head injury  . Cancer Father     ?stomach ca  . Diabetes Paternal Aunt   . Hypertension Maternal Grandmother   . Thyroid disease Maternal Grandmother     ROS:  Pertinent items are noted in HPI.  Otherwise, a comprehensive ROS was negative.  Exam:   BP 100/68 mmHg  Pulse 80  Resp 18  Ht 5' 2.5" (1.588 m)  Wt 124 lb 6.4 oz (56.427 kg)  BMI 22.38 kg/m2  LMP 03/10/2014 (Exact Date)    General appearance: alert, cooperative and appears stated age Head: Normocephalic, without obvious abnormality, atraumatic Neck: no adenopathy, supple, symmetrical, trachea midline and thyroid normal to inspection and palpation Lungs: clear to auscultation bilaterally Breasts:  normal appearance, no masses or tenderness, Inspection negative, No nipple retraction or dimpling, No nipple discharge or bleeding, No axillary or supraclavicular adenopathy Heart: regular rate and rhythm Abdomen: soft, non-tender; bowel sounds normal; no masses,  no organomegaly Extremities: extremities normal, atraumatic, no cyanosis or edema Skin: Skin color, texture, turgor normal. No rashes or lesions Lymph nodes: Cervical, supraclavicular, and axillary nodes normal. No abnormal inguinal nodes palpated Neurologic: Grossly normal  Pelvic: External genitalia:  no lesions              Urethra:  normal appearing urethra with no masses, tenderness or lesions              Bartholins and Skenes: normal                 Vagina: normal appearing vagina with normal color and discharge, no lesions              Cervix: polyp - removed with patinet permission.              Pap taken: Yes.   Bimanual Exam:  Uterus:  normal size, contour, position, consistency, mobility, non-tender              Adnexa: normal adnexa and no mass, fullness, tenderness              Rectovaginal: Yes.  .  Confirms.              Anus:  normal sphincter tone, no lesions  Chaperone was present for exam.  Assessment:   Well woman visit with normal exam. Status post endometrial ablation.  Spotting and cramping.  Rectal spasms. Cervical polyp.   Plan: Yearly mammogram recommended after age 4.  Discussed 3D. Recommended self breast exam.  Pap and HR HPV as above. Polyp removed from cervix - to pathology.  Discussed Calcium, Vitamin D, regular exercise program including cardiovascular and weight bearing exercise. Labs performed.  Yes.  .   See orders. Refills given on medications.  No.. Discussed Micronor.  Patient will consider.  Follow up with GI.  Consider antispasm agent - Levsin, etc.  Follow up annually and prn.   After visit summary provided.

## 2015-04-24 NOTE — Patient Instructions (Signed)
EXERCISE AND DIET:  We recommended that you start or continue a regular exercise program for good health. Regular exercise means any activity that makes your heart beat faster and makes you sweat.  We recommend exercising at least 30 minutes per day at least 3 days a week, preferably 4 or 5.  We also recommend a diet low in fat and sugar.  Inactivity, poor dietary choices and obesity can cause diabetes, heart attack, stroke, and kidney damage, among others.    ALCOHOL AND SMOKING:  Women should limit their alcohol intake to no more than 7 drinks/beers/glasses of wine (combined, not each!) per week. Moderation of alcohol intake to this level decreases your risk of breast cancer and liver damage. And of course, no recreational drugs are part of a healthy lifestyle.  And absolutely no smoking or even second hand smoke. Most people know smoking can cause heart and lung diseases, but did you know it also contributes to weakening of your bones? Aging of your skin?  Yellowing of your teeth and nails?  CALCIUM AND VITAMIN D:  Adequate intake of calcium and Vitamin D are recommended.  The recommendations for exact amounts of these supplements seem to change often, but generally speaking 600 mg of calcium (either carbonate or citrate) and 800 units of Vitamin D per day seems prudent. Certain women may benefit from higher intake of Vitamin D.  If you are among these women, your doctor will have told you during your visit.    PAP SMEARS:  Pap smears, to check for cervical cancer or precancers,  have traditionally been done yearly, although recent scientific advances have shown that most women can have pap smears less often.  However, every woman still should have a physical exam from her gynecologist every year. It will include a breast check, inspection of the vulva and vagina to check for abnormal growths or skin changes, a visual exam of the cervix, and then an exam to evaluate the size and shape of the uterus and  ovaries.  And after 46 years of age, a rectal exam is indicated to check for rectal cancers. We will also provide age appropriate advice regarding health maintenance, like when you should have certain vaccines, screening for sexually transmitted diseases, bone density testing, colonoscopy, mammograms, etc.   MAMMOGRAMS:  All women over 40 years old should have a yearly mammogram. Many facilities now offer a "3D" mammogram, which may cost around $50 extra out of pocket. If possible,  we recommend you accept the option to have the 3D mammogram performed.  It both reduces the number of women who will be called back for extra views which then turn out to be normal, and it is better than the routine mammogram at detecting truly abnormal areas.    COLONOSCOPY:  Colonoscopy to screen for colon cancer is recommended for all women at age 50.  We know, you hate the idea of the prep.  We agree, BUT, having colon cancer and not knowing it is worse!!  Colon cancer so often starts as a polyp that can be seen and removed at colonscopy, which can quite literally save your life!  And if your first colonoscopy is normal and you have no family history of colon cancer, most women don't have to have it again for 10 years.  Once every ten years, you can do something that may end up saving your life, right?  We will be happy to help you get it scheduled when you are ready.    Be sure to check your insurance coverage so you understand how much it will cost.  It may be covered as a preventative service at no cost, but you should check your particular policy.     Norethindrone tablets (contraception) What is this medicine? NORETHINDRONE (nor eth IN drone) is an oral contraceptive. The product contains a female hormone known as a progestin. It is used to prevent pregnancy. This medicine may be used for other purposes; ask your health care provider or pharmacist if you have questions. COMMON BRAND NAME(S): Camila, Deblitane 28-Day,  Errin, Heather, [place], Jolivette, Donna, Nor-QD, Nora-BE, Norlyroc, Ortho Micronor, [employer] 28-Day What should I tell my health care provider before I take this medicine? They need to know if you have any of these conditions: -blood vessel disease or blood clots -breast, cervical, or vaginal cancer -diabetes -heart disease -kidney disease -liver disease -mental depression -migraine -seizures -stroke -vaginal bleeding -an unusual or allergic reaction to norethindrone, other medicines, foods, dyes, or preservatives -pregnant or trying to get pregnant -breast-feeding How should I use this medicine? Take this medicine by mouth with a glass of water. You may take it with or without food. Follow the directions on the prescription label. Take this medicine at the same time each day and in the order directed on the package. Do not take your medicine more often than directed. Contact your pediatrician regarding the use of this medicine in children. Special care may be needed. This medicine has been used in female children who have started having menstrual periods. A patient package insert for the product will be given with each prescription and refill. Read this sheet carefully each time. The sheet may change frequently. Overdosage: If you think you have taken too much of this medicine contact a poison control center or emergency room at once. NOTE: This medicine is only for you. Do not share this medicine with others. What if I miss a dose? Try not to miss a dose. Every time you miss a dose or take a dose late your chance of pregnancy increases. When 1 pill is missed (even if only 3 hours late), take the missed pill as soon as possible and continue taking a pill each day at the regular time (use a back up method of birth control for the next 48 hours). If more than 1 dose is missed, use an additional birth control method for the rest of your pill pack until menses occurs. Contact your health care  professional if more than 1 dose has been missed. What may interact with this medicine? Do not take this medicine with any of the following medications: -amprenavir or fosamprenavir -bosentan This medicine may also interact with the following medications: -antibiotics or medicines for infections, especially rifampin, rifabutin, rifapentine, and griseofulvin, and possibly penicillins or tetracyclines -aprepitant -barbiturate medicines, such as phenobarbital -carbamazepine -felbamate -modafinil -oxcarbazepine -phenytoin -ritonavir or other medicines for HIV infection or AIDS -St. John's wort -topiramate This list may not describe all possible interactions. Give your health care provider a list of all the medicines, herbs, non-prescription drugs, or dietary supplements you use. Also tell them if you smoke, drink alcohol, or use illegal drugs. Some items may interact with your medicine. What should I watch for while using this medicine? Visit your doctor or health care professional for regular checks on your progress. You will need a regular breast and pelvic exam and Pap smear while on this medicine. Use an additional method of birth control during the first cycle that  you take these tablets. If you have any reason to think you are pregnant, stop taking this medicine right away and contact your doctor or health care professional. If you are taking this medicine for hormone related problems, it may take several cycles of use to see improvement in your condition. This medicine does not protect you against HIV infection (AIDS) or any other sexually transmitted diseases. What side effects may I notice from receiving this medicine? Side effects that you should report to your doctor or health care professional as soon as possible: -breast tenderness or discharge -pain in the abdomen, chest, groin or leg -severe headache -skin rash, itching, or hives -sudden shortness of breath -unusually weak  or tired -vision or speech problems -yellowing of skin or eyes Side effects that usually do not require medical attention (report to your doctor or health care professional if they continue or are bothersome): -changes in sexual desire -change in menstrual flow -facial hair growth -fluid retention and swelling -headache -irritability -nausea -weight gain or loss This list may not describe all possible side effects. Call your doctor for medical advice about side effects. You may report side effects to FDA at 1-800-FDA-1088. Where should I keep my medicine? Keep out of the reach of children. Store at room temperature between 15 and 30 degrees C (59 and 86 degrees F). Throw away any unused medicine after the expiration date. NOTE: This sheet is a summary. It may not cover all possible information. If you have questions about this medicine, talk to your doctor, pharmacist, or health care provider.  2015, Elsevier/Gold Standard. (2012-04-01 16:41:35)  Menopause and Herbal Products Menopause is the normal time of life when menstrual periods stop completely. Menopause is complete when you have missed 12 consecutive menstrual periods. It usually occurs between the ages of 47 to 25, with an average age of 55. Very rarely does a woman develop menopause before 46 years old. At menopause, your ovaries stop producing the female hormones, estrogen and progesterone. This can cause undesirable symptoms and also affect your health. Sometimes the symptoms can occur 4 to 5 years before the menopause begins. There is no relationship between menopause and:  Oral contraceptives.  Number of children you had.  Race.  The age your menstrual periods started (menarche). Heavy smokers and very thin women may develop menopause earlier in life. Estrogen and progesterone hormone treatment is the usual method of treating menopausal symptoms. However, there are women who should not take hormone treatment. This is true  of:   Women that have breast or uterine cancer.  Women who prefer not to take hormones because of certain side effects (abnormal uterine bleeding).  Women who are afraid that hormones may cause breast cancer.  Women who have a history of liver disease, heart disease, stroke, or blood clots. For these women, there are other medications that may help treat their menopausal symptoms. These medications are found in plants and botanical products. They can be found in the form of herbs, teas, oils, tinctures, and pills.  CAUSES:  The ovaries stop producing the female hormones estrogen and progesterone.  Other causes include:  Surgery to remove both ovaries.  The ovaries stop functioning for no know reason.  Tumors of the pituitary gland in the brain.  Medical disease that affects the ovaries and hormone production.  Radiation treatment to the abdomen or pelvis.  Chemotherapy that affects the ovaries. PHYTOESTROGENS: Phytoestrogens occur naturally in plants and plant products. They act like estrogen in the body.  Herbal medications are made from these plants and botanical steroids. There are 3 types of phytoestrogens:  Isoflavones (genistein and daidzein) are found in soy, garbanzo beans, miso and tofu foods.  Ligins are found in the shell of seeds. They are used to make oils like flaxseed oil. The bacteria in your intestine act on these foods to produce the estrogen-like hormones.  Coumestans are estrogen-like. Some of the foods they are found in include sunflower seeds and bean sprouts. CONDITIONS AND THEIR POSSIBLE HERBAL TREATMENT:  Hot flashes and night sweats.  Soy, black cohosh and evening primrose.  Irritability, insomnia, depression and memory problems.  Chasteberry, ginseng, and soy.  St. John's wort may be helpful for depression. However, there is a concern of it causing cataracts of the eye and may have bad effects on other medications. St. John's wort should not be  taken for long time and without your caregiver's advice.  Loss of libido and vaginal and skin dryness.  Wild yam and soy.  Prevention of coronary heart disease and osteoporosis.  Soy and Isoflavones. Several studies have shown that some women benefit from herbal medications, but most of the studies have not consistently shown that these supplements are much better than placebo. Other forms of treatment to help women with menopausal symptoms include a balanced diet, rest, exercise, vitamin and calcium (with vitamin D) supplements, acupuncture, and group therapy when necessary. THOSE WHO SHOULD NOT TAKE HERBAL MEDICATIONS INCLUDE:  Women who are planning on getting pregnant unless told by your caregiver.  Women who are breastfeeding unless told by your caregiver.  Women who are taking other prescription medications unless told by your caregiver.  Infants, children, and elderly women unless told by your caregiver. Different herbal medications have different and unmeasured amounts of the herbal ingredients. There are no regulations, quality control, and standardization of the ingredients in herbal medications. Therefore, the amount of the ingredient in the medication may vary from one herb, pill, tea, oil or tincture to another. Many herbal medications can cause serious problems and can even have poisonous effects if taken too much or too long. If problems develop, the medication should be stopped and recorded by your caregiver. HOME CARE INSTRUCTIONS  Do not take or give children herbal medications without your caregiver's advice.  Let your caregiver know all the medications you are taking. This includes prescription, over-the-counter, eye drops, and creams.  Do not take herbal medications longer or more than recommended.  Tell your caregiver about any side effects from the medication. SEEK MEDICAL CARE IF:  You develop a fever of 102 F (38.9 C), or as directed by your caregiver.  You  feel sick to your stomach (nauseous), vomit, or have diarrhea.  You develop a rash.  You develop abdominal pain.  You develop severe headaches.  You start to have vision problems.  You feel dizzy or faint.  You start to feel numbness in any part of your body.  You start shaking (have convulsions). Document Released: 12/30/2007 Document Revised: 06/29/2012 Document Reviewed: 07/29/2010 [facility] Patient Information 2015 [place], [place]. This information is not intended to replace advice given to you by your health care provider. Make sure you discuss any questions you have with your health care provider.

## 2015-04-25 LAB — ESTRADIOL: Estradiol: 125 pg/mL

## 2015-04-25 LAB — FOLLICLE STIMULATING HORMONE: FSH: 6.9 m[IU]/mL

## 2015-04-25 LAB — HEMOGLOBIN A1C
Hgb A1c MFr Bld: 5.5 % (ref <5.7)
Mean Plasma Glucose: 111 mg/dL (ref <117)

## 2015-04-26 LAB — IPS OTHER TISSUE BIOPSY

## 2015-04-26 LAB — IPS PAP TEST WITH HPV

## 2015-07-02 ENCOUNTER — Other Ambulatory Visit (HOSPITAL_COMMUNITY): Payer: Self-pay | Admitting: Neurosurgery

## 2015-07-02 DIAGNOSIS — M4722 Other spondylosis with radiculopathy, cervical region: Secondary | ICD-10-CM

## 2015-07-16 ENCOUNTER — Ambulatory Visit (HOSPITAL_COMMUNITY)
Admission: RE | Admit: 2015-07-16 | Discharge: 2015-07-16 | Disposition: A | Discharge status: Home / Self Care | Payer: PRIVATE HEALTH INSURANCE | Source: Ambulatory Visit | Attending: Neurosurgery | Admitting: Neurosurgery

## 2015-07-16 DIAGNOSIS — M542 Cervicalgia: Secondary | ICD-10-CM | POA: Insufficient documentation

## 2015-07-16 DIAGNOSIS — R51 Headache: Secondary | ICD-10-CM | POA: Diagnosis not present

## 2015-07-16 DIAGNOSIS — M4722 Other spondylosis with radiculopathy, cervical region: Secondary | ICD-10-CM | POA: Insufficient documentation

## 2015-07-16 DIAGNOSIS — R200 Anesthesia of skin: Secondary | ICD-10-CM | POA: Diagnosis not present

## 2015-07-16 DIAGNOSIS — R531 Weakness: Secondary | ICD-10-CM | POA: Insufficient documentation

## 2015-08-09 MED FILL — OXYCODONE/APAP 5-325: 5-325 | 30 days supply | Qty: 30 | Fill #0

## 2015-08-09 MED FILL — DICLOFENAC SODIUM 1% GEL: 1 | 30 days supply | Qty: 300 | Fill #0

## 2015-08-15 MED FILL — VYVANSE 20 MG CAPSULE: 20 | 30 days supply | Qty: 30 | Fill #0

## 2015-08-15 MED FILL — FLUTICASONE PROP 50 MCG SPR: 50 | 30 days supply | Qty: 16 | Fill #2

## 2015-09-05 MED FILL — OMEPRAZOLE DR 20 MG CAPSULE: 20 | 90 days supply | Qty: 90 | Fill #4

## 2015-09-10 MED FILL — OXYCODONE/APAP 5-325: 5-325 | 30 days supply | Qty: 30 | Fill #0

## 2015-09-10 MED FILL — FLUTICASONE PROP 50 MCG SPR: 50 | 30 days supply | Qty: 16 | Fill #0

## 2015-09-12 MED FILL — VYVANSE 20 MG CAPSULE: 20 | 30 days supply | Qty: 30 | Fill #0

## 2015-09-13 DIAGNOSIS — H9201 Otalgia, right ear: Secondary | ICD-10-CM | POA: Diagnosis not present

## 2015-09-13 DIAGNOSIS — H6123 Impacted cerumen, bilateral: Secondary | ICD-10-CM | POA: Insufficient documentation

## 2015-09-13 DIAGNOSIS — J0101 Acute recurrent maxillary sinusitis: Secondary | ICD-10-CM | POA: Insufficient documentation

## 2015-09-13 DIAGNOSIS — J309 Allergic rhinitis, unspecified: Secondary | ICD-10-CM | POA: Insufficient documentation

## 2015-09-13 DIAGNOSIS — R51 Headache: Secondary | ICD-10-CM | POA: Diagnosis not present

## 2015-09-13 DIAGNOSIS — F902 Attention-deficit hyperactivity disorder, combined type: Secondary | ICD-10-CM | POA: Insufficient documentation

## 2015-09-13 DIAGNOSIS — H6121 Impacted cerumen, right ear: Secondary | ICD-10-CM | POA: Diagnosis not present

## 2015-09-13 MED FILL — levoFLOXacin 500 MG TABS: 500 | 10 days supply | Qty: 10 | Fill #0

## 2015-09-16 ENCOUNTER — Ambulatory Visit: Payer: Worker's Compensation | Admitting: Physical Therapy

## 2015-09-16 ENCOUNTER — Ambulatory Visit: Payer: 59 | Admitting: Physical Therapy

## 2015-09-17 ENCOUNTER — Ambulatory Visit: Payer: 59

## 2015-09-18 ENCOUNTER — Ambulatory Visit: Payer: 59 | Admitting: Physical Therapy

## 2015-09-18 ENCOUNTER — Encounter: Payer: Self-pay | Admitting: Physical Therapy

## 2015-09-18 ENCOUNTER — Ambulatory Visit: Payer: PRIVATE HEALTH INSURANCE | Attending: Physical Medicine and Rehabilitation | Admitting: Physical Therapy

## 2015-09-18 ENCOUNTER — Ambulatory Visit: Payer: Worker's Compensation

## 2015-09-18 DIAGNOSIS — M542 Cervicalgia: Secondary | ICD-10-CM | POA: Diagnosis not present

## 2015-09-18 DIAGNOSIS — R29898 Other symptoms and signs involving the musculoskeletal system: Secondary | ICD-10-CM | POA: Insufficient documentation

## 2015-09-18 DIAGNOSIS — M5010 Cervical disc disorder with radiculopathy, unspecified cervical region: Secondary | ICD-10-CM | POA: Insufficient documentation

## 2015-09-18 NOTE — Patient Instructions (Signed)
Upper Limb Neural Tension: Ulnar III    Bend right elbow and position fingers around eye, hand upside down. Pull elbow backward. Hold __5__ seconds. Repeat __2__ times per set. Do ___1_ sets per session. Do ___2_ sessions per day.  http://orth.exer.us/418   Copyright  VHI. All rights reserved.  Upper Limb Neural Tension: Median I    Stand with left palm flat on wall, fingers up. Bend elbow _40___ degrees and Hold _1___ seconds. Straighten elbow and Hold __5__ seconds. Repeat ___4_ times per set. Do __2__ sets per session. Do _2___ sessions per day.  http://orth.exer.us/402   Copyright  VHI. All rights reserved.   [facility] [address], [place], [place] [address] Phone # [phone] Fax [phone]

## 2015-09-18 NOTE — Therapy (Signed)
[facility] [address], [place], [place], [address] Phone: [phone]   Fax:  [phone]  Physical Therapy Evaluation  Patient Details  Name: [name] MRN: [id number] Date of Birth: 1970/11/09 Referring Provider: Dr. Suella Broad  Encounter Date: 09/18/2015      PT End of Session - 09/18/15 1318    Visit Number 1   Date for PT Re-Evaluation 11/13/15   Authorization Type workers comp   Authorization - Visit Number 1   Authorization - Number of Visits 10   PT Start Time 1100   PT Stop Time 1145   PT Time Calculation (min) 45 min   Activity Tolerance Patient tolerated treatment well   Behavior During Therapy [facility] for tasks assessed/performed      Past Medical History  Diagnosis Date  . Irregular heart beat     history - no treatment  Dr Wynonia Lawman  . Hyperthyroidism     no meds  . GERD (gastroesophageal reflux disease)   . Headache(784.0)     otc meds prn  . PONV (postoperative nausea and vomiting)   . Sinus tachycardia ([place])   . Urinary incontinence     urgency and night urination  . Right ovarian cyst 2016    CT scan - 1 cm, possible dermoid.    Past Surgical History  Procedure Laterality Date  . Anterior and posterior repair    . Incontinence surgery    . Nose surgery    . Shoulder surgery      bilateral left x 2, right x 1 surgery  . Upper gastrointestinal endoscopy    . Svd       x 1  . Dilation and curettage of uterus    . Diagnostic laparoscopy    . Wisdom tooth extraction    . Laparoscopy  06/12/2011    Procedure: LAPAROSCOPY OPERATIVE;  Surgeon: Arloa Koh;  Location: [facility];  Service: Gynecology;  Laterality: N/A;   Left Ovarian Cystectomy  . Endometrial ablation  05/2011    There were no vitals filed for this visit.  Visit Diagnosis:  Cervical pain - Plan: PT plan of care cert/re-cert  Cervical disc disorder with radiculopathy of cervical region - Plan: PT plan of care  cert/re-cert  Weakness of right arm - Plan: PT plan of care cert/re-cert      Subjective Assessment - 09/18/15 1108    Subjective Patient reports cervical injury on 09/27/2012.  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 Patient has headaches all day.    Limitations Lifting;Reading;House hold activities   Diagnostic tests MRI showed C4-C5 with bulging disc   Currently in Pain? Yes   Pain Score 4    Pain Location Neck   Pain Orientation Right   Pain Descriptors / Indicators Numbness;Tingling;Burning  burning along the ulnar nerve dermatome   Pain Type Chronic pain   Pain Radiating Towards down right arm   Pain Onset More than a month ago   Pain Frequency Intermittent   Aggravating Factors  movement of right arm, lifting with right arm, laying down on right side   Pain Relieving Factors HOme TENS  unit, cervical traction, not moveing right arm, trigger pressure on right cervical   Effect of Pain on Daily Activities daily activities   Multiple Pain Sites No            [facility] PT Assessment - 09/18/15 0001    Assessment   Medical Diagnosis Degenerative  Cervical Disc; Cervical Radiculopathy   Referring Provider Dr. Suella Broad   Onset Date/Surgical Date 10/07/12   Prior Therapy Yes   Precautions   Precautions None   Balance Screen   Has the patient fallen in the past 6 months No   Has the patient had a decrease in activity level because of a fear of falling?  No   Is the patient reluctant to leave their home because of a fear of falling?  No   Prior Function   Level of Independence Independent   Vocation Full time employment   Vocation Requirements lifting and pulling   Cognition   Overall Cognitive Status Within Functional Limits for tasks assessed   Observation/Other Assessments   Focus on Therapeutic Outcomes (FOTO)  54% limitation  47% limitation  CK   Posture/Postural Control   Posture/Postural Control Postural limitations   Postural Limitations Forward  head;Rounded Shoulders   AROM   Cervical Flexion 40   Cervical Extension 20   Cervical - Right Side Bend 25   Cervical - Left Side Bend 30   Cervical - Right Rotation 40   Cervical - Left Rotation 40   Strength   Right/Left Shoulder Right   Right Shoulder Internal Rotation 4/5   Right Shoulder External Rotation 4/5   Right/Left Elbow Right   Right Elbow Flexion 4/5   Right/Left Wrist Right   Right Wrist Flexion 4/5   Right Wrist Extension 4/5   Right Hand Gross Grasp Impaired  46 pounds   Left Hand Gross Grasp Impaired  60 pounds   Palpation   SI assessment  Decreased mobility of C3-C5   Palpation comment when press on right side of C4 pain in right arm is abolished; tenderness located in right subocciptials and right cervical paraspinals .   Special Tests    Special Tests Cervical   Cervical Tests other   other    Findings Positive   Side Right   Comment neural tension test for brachial plexus and ulnar nerve                           PT Education - 09/18/15 1318    Education provided Yes   Education Details neural tension stretch on right   Person(s) Educated Patient   Methods Explanation;Demonstration;Verbal cues;Handout   Comprehension Returned demonstration;Verbalized understanding          PT Short Term Goals - 09/18/15 1328    PT SHORT TERM GOAL #1   Title independent of intial HEP   Time 4   Period Weeks   Status New   PT SHORT TERM GOAL #2   Title cervcial and right UE pain has decreased >/= 25% with daily activities   Time 4   Period Weeks   Status New   PT SHORT TERM GOAL #3   Title ability to use her right UE more due to increased strength   Time 4   Period Weeks   Status New           PT Long Term Goals - 09/18/15 1329    PT LONG TERM GOAL #1   Title Independent with HEP and unstands how to progress herself   Time 8   Period Weeks   Status New   PT LONG TERM GOAL #2   Title cervical and right UE pain decreased  >/= 75% so she is able to use her right arm more   Time  8   Period Weeks   Status New   PT LONG TERM GOAL #3   Title FOTO score </= 47% limitation   Time 8   Period Weeks   Status New   PT LONG TERM GOAL #4   Title numbness and burning pain in right lower arm reduced >/= 50%   Time 8   Period Weeks   Status New   PT LONG TERM GOAL #5   Title sleep with >/= 75% greater ease due to reduce pain   Time 8   Period Weeks   Status New               Plan - 09/18/15 1319    Clinical Impression Statement Patient is a 45 year old female with diagnosis of cervical pain and radiculopathy since 10/07/2012 from lifitng a heavy individual at work. FOTO score is 54% limitation.  Cervical pain is  4/10 with burning dwon right U e in ulnar nerve dermatone. Patient pain is worse with use of right UE.  Right grip strength is 46 pounds and left is 60 pounds. Cervical ROM is 25 % to 50% limited for all ranges.  Right shoulder ER and IR is 4/5.  Rigth elbow and wrist strength is 4/5.  Positive neural tnesion stretch to righ tulnar nerve and brachial pelxus. Palpable tenderness located on right side of C3-4 that will abolish the burning pain in right UE when pressed on.  Patient uses her right  arm less due to pain  and has difficulty with sleeping. Patient  has daily headache. Paient  is of low complexity. Patient would benefit from skilled therapy to improve pain and function of right UE.    Pt will benefit from skilled therapeutic intervention in order to improve on the following deficits Increased fascial restricitons;Pain;Decreased mobility;Increased muscle spasms;Postural dysfunction;Decreased activity tolerance;Decreased endurance;Decreased range of motion;Decreased strength   Rehab Potential Excellent   Clinical Impairments Affecting Rehab Potential None   PT Frequency 2x / week   PT Duration 8 weeks   PT Treatment/Interventions Cryotherapy;Electrical Stimulation;Iontophoresis 4mg /ml  Dexamethasone;Moist Heat;Traction;Ultrasound;Contrast Bath;Therapeutic exercise;Therapeutic activities;Functional mobility training;Neuromuscular re-education;Patient/family education;Manual techniques;Passive range of motion;Dry needling   PT Next Visit Plan cervical traction, dry needling to right cervical musculature, soft tissue work, cervical joint mobilization, posture awareness, right UE strengthening   PT Home Exercise Plan ROM exercises, cervical stretches   Recommended Other Services None   Consulted and Agree with Plan of Care Patient         Problem List There are no active problems to display for this patient.   Earlie Counts, PT 09/18/2015 1:35 PM   [facility] [address] [place], [place], [address] Phone: [phone]   Fax:  [phone]  Name: [name] MRN: [id number] Date of Birth: 06/21/1971

## 2015-09-23 ENCOUNTER — Encounter: Payer: Self-pay | Admitting: Physical Therapy

## 2015-09-23 ENCOUNTER — Ambulatory Visit: Payer: PRIVATE HEALTH INSURANCE | Admitting: Physical Therapy

## 2015-09-23 DIAGNOSIS — M5010 Cervical disc disorder with radiculopathy, unspecified cervical region: Secondary | ICD-10-CM

## 2015-09-23 DIAGNOSIS — M542 Cervicalgia: Secondary | ICD-10-CM | POA: Diagnosis not present

## 2015-09-23 DIAGNOSIS — R29898 Other symptoms and signs involving the musculoskeletal system: Secondary | ICD-10-CM

## 2015-09-23 MED FILL — DICLOFENAC SODIUM 1% GEL: 1 | 30 days supply | Qty: 300 | Fill #0

## 2015-09-23 NOTE — Therapy (Signed)
[facility] [address] [place], [place], [address] Phone: [phone]   Fax:  [phone]  Physical Therapy Treatment  Patient Details  Name: MARLIYAH GLASBY MRN: [id number] Date of Birth: 1971/06/25 Referring Provider: Dr. Suella Broad  Encounter Date: 09/23/2015      PT End of Session - 09/23/15 1523    Visit Number 2   Date for PT Re-Evaluation 11/13/15   Authorization Type workers comp   Authorization - Visit Number 2   Authorization - Number of Visits 10   PT Start Time 1450   PT Stop Time 1535   PT Time Calculation (min) 45 min   Activity Tolerance Patient tolerated treatment well   Behavior During Therapy [facility] for tasks assessed/performed      Past Medical History  Diagnosis Date  . Irregular heart beat     history - no treatment  Dr Wynonia Lawman  . Hyperthyroidism     no meds  . GERD (gastroesophageal reflux disease)   . Headache(784.0)     otc meds prn  . PONV (postoperative nausea and vomiting)   . Sinus tachycardia ([place])   . Urinary incontinence     urgency and night urination  . Right ovarian cyst 2016    CT scan - 1 cm, possible dermoid.    Past Surgical History  Procedure Laterality Date  . Anterior and posterior repair    . Incontinence surgery    . Nose surgery    . Shoulder surgery      bilateral left x 2, right x 1 surgery  . Upper gastrointestinal endoscopy    . Svd       x 1  . Dilation and curettage of uterus    . Diagnostic laparoscopy    . Wisdom tooth extraction    . Laparoscopy  06/12/2011    Procedure: LAPAROSCOPY OPERATIVE;  Surgeon: Arloa Koh;  Location: [facility];  Service: Gynecology;  Laterality: N/A;   Left Ovarian Cystectomy  . Endometrial ablation  05/2011    There were no vitals filed for this visit.  Visit Diagnosis:  Cervical pain  Cervical disc disorder with radiculopathy of cervical region  Weakness of right arm      Subjective Assessment -  09/23/15 1520    Subjective Was aggrevated after eval for a few days although ROM did improve. Post hand numbness and HA today.   Currently in Pain? Yes  See above   Pain Score 6    Pain Location --  Headache   Pain Descriptors / Indicators Numbness  Posterior RT hand   Aggravating Factors  Lifting, laying down at night   Pain Relieving Factors Pressure on the right, not lifting   Multiple Pain Sites No                         [facility] Adult PT Treatment/Exercise - 09/23/15 0001    Traction   Type of Traction Cervical   Min (lbs) 5   Max (lbs) 14   Hold Time 60   Rest Time 20   Time 10   Manual Therapy   Manual Therapy Soft tissue mobilization   Soft tissue mobilization RT >LT multiple TP                PT Education - 09/23/15 1536    Education provided Yes   Education Details How to use foam roll for fascial release work pt  could do at home.    Person(s) Educated Patient   Methods Explanation;Demonstration   Comprehension Verbalized understanding;Returned demonstration          PT Short Term Goals - 09/23/15 1526    PT SHORT TERM GOAL #1   Title independent of intial HEP   Time 4   Period Weeks   Status Achieved           PT Long Term Goals - 09/18/15 1329    PT LONG TERM GOAL #1   Title Independent with HEP and unstands how to progress herself   Time 8   Period Weeks   Status New   PT LONG TERM GOAL #2   Title cervical and right UE pain decreased >/= 75% so she is able to use her right arm more   Time 8   Period Weeks   Status New   PT LONG TERM GOAL #3   Title FOTO score </= 47% limitation   Time 8   Period Weeks   Status New   PT LONG TERM GOAL #4   Title numbness and burning pain in right lower arm reduced >/= 50%   Time 8   Period Weeks   Status New   PT LONG TERM GOAL #5   Title sleep with >/= 75% greater ease due to reduce pain   Time 8   Period Weeks   Status New               Plan - 09/23/15 1523     Clinical Impression Statement Symtpoms not so bad today as pt has been off work for a few days. She is doing her stretches at home and reports her ROM feels better since the eval. Began traction today and continues to have daily headaches. Too early for goals as pt just started PT.   Pt will benefit from skilled therapeutic intervention in order to improve on the following deficits Increased fascial restricitons;Pain;Decreased mobility;Increased muscle spasms;Postural dysfunction;Decreased activity tolerance;Decreased endurance;Decreased range of motion;Decreased strength   Rehab Potential Excellent   Clinical Impairments Affecting Rehab Potential None   PT Frequency 2x / week   PT Duration 8 weeks   PT Treatment/Interventions Cryotherapy;Electrical Stimulation;Iontophoresis 4mg /ml Dexamethasone;Moist Heat;Traction;Ultrasound;Contrast Bath;Therapeutic exercise;Therapeutic activities;Functional mobility training;Neuromuscular re-education;Patient/family education;Manual techniques;Passive range of motion;Dry needling   PT Next Visit Plan Assess traction, soft tissue work, joint mobs if seen by a PT, or dry needling.    Consulted and Agree with Plan of Care Patient        Problem List There are no active problems to display for this patient.   COCHRAN,JENNIFER, PTA 09/23/2015, 3:37 PM  [facility] [address], [place], [place], [address] Phone: [phone]   Fax:  [phone]  Name: TYIANNA SHAMBLIN MRN: [id number] Date of Birth: Oct 05, 1970

## 2015-09-26 ENCOUNTER — Ambulatory Visit: Payer: PRIVATE HEALTH INSURANCE | Attending: Physical Medicine and Rehabilitation | Admitting: Physical Therapy

## 2015-09-26 DIAGNOSIS — M542 Cervicalgia: Secondary | ICD-10-CM | POA: Insufficient documentation

## 2015-09-26 DIAGNOSIS — R29898 Other symptoms and signs involving the musculoskeletal system: Secondary | ICD-10-CM | POA: Insufficient documentation

## 2015-09-26 DIAGNOSIS — M5010 Cervical disc disorder with radiculopathy, unspecified cervical region: Secondary | ICD-10-CM | POA: Diagnosis present

## 2015-09-26 NOTE — Therapy (Signed)
[facility] [address] [place], [place], [address] Phone: [phone]   Fax:  [phone]  Physical Therapy Treatment  Patient Details  Name: [name] MRN: [id number] Date of Birth: Jan 09, 1971 Referring Provider: Dr. Suella Broad  Encounter Date: 09/26/2015      PT End of Session - 09/26/15 1600    Visit Number 3   Date for PT Re-Evaluation 11/13/15   Authorization Type workers comp   PT Start Time 1450   PT Stop Time 1540   PT Time Calculation (min) 50 min   Activity Tolerance Patient tolerated treatment well      Past Medical History  Diagnosis Date  . Irregular heart beat     history - no treatment  Dr Wynonia Lawman  . Hyperthyroidism     no meds  . GERD (gastroesophageal reflux disease)   . Headache(784.0)     otc meds prn  . PONV (postoperative nausea and vomiting)   . Sinus tachycardia ([place])   . Urinary incontinence     urgency and night urination  . Right ovarian cyst 2016    CT scan - 1 cm, possible dermoid.    Past Surgical History  Procedure Laterality Date  . Anterior and posterior repair    . Incontinence surgery    . Nose surgery    . Shoulder surgery      bilateral left x 2, right x 1 surgery  . Upper gastrointestinal endoscopy    . Svd       x 1  . Dilation and curettage of uterus    . Diagnostic laparoscopy    . Wisdom tooth extraction    . Laparoscopy  06/12/2011    Procedure: LAPAROSCOPY OPERATIVE;  Surgeon: Arloa Koh;  Location: [facility];  Service: Gynecology;  Laterality: N/A;   Left Ovarian Cystectomy  . Endometrial ablation  05/2011    There were no vitals filed for this visit.  Visit Diagnosis:  Cervical pain  Cervical disc disorder with radiculopathy of cervical region  Weakness of right arm      Subjective Assessment - 09/26/15 1453    Subjective Symptoms aggravated by work duties.  N/T in right 4th/5th fingers since eval.  Better with soft tissue work in  right neck.  No big changes with cervical traction last visit.   Had radiofrequency treatment in past which helped but the nerves are coming back.     Currently in Pain? Yes   Pain Score 4    Pain Location Neck   Pain Orientation Right   Pain Type Chronic pain                         [facility] Adult PT Treatment/Exercise - 09/26/15 0001    Neck Exercises: Seated   Neck Retraction 10 reps   Other Seated Exercise upper trap stretch 20 sec holds   Neck Exercises: Supine   Neck Retraction 10 reps   Modalities   Modalities Moist Heat   Moist Heat Therapy   Number Minutes Moist Heat 10 Minutes   Moist Heat Location Cervical   Manual Therapy   Manual Therapy Joint mobilization;Soft tissue mobilization;Manual Traction;Muscle Energy Technique   Joint Mobilization C 4-7 PA grade grade 3    Soft tissue mobilization Right UT, levator, suboccipitals   Manual Traction 3x 60 sec   Muscle Energy Technique right upper trap contract/relax  Trigger Point Dry Needling - 09/26/15 1614    Consent Given? Yes   Education Handout Provided Yes   Muscles Treated Upper Body Upper trapezius;Suboccipitals muscle group;Levator scapulae   Upper Trapezius Response Twitch reponse elicited;Palpable increased muscle length   SubOccipitals Response Palpable increased muscle length   Levator Scapulae Response Twitch response elicited;Palpable increased muscle length      Right only.        PT Education - 09/26/15 1536    Education provided Yes   Education Details dry needling after care   Person(s) Educated Patient   Methods Explanation;Demonstration;Handout   Comprehension Verbalized understanding;Returned demonstration          PT Short Term Goals - 09/26/15 1635    PT SHORT TERM GOAL #1   Title independent of intial HEP   Status Achieved   PT SHORT TERM GOAL #2   Title cervcial and right UE pain has decreased >/= 25% with daily activities   Time 4   Period Weeks    Status On-going   PT SHORT TERM GOAL #3   Title ability to use her right UE more due to increased strength   Time 4   Status On-going           PT Long Term Goals - 09/26/15 1635    PT LONG TERM GOAL #1   Title Independent with HEP and unstands how to progress herself   Time 8   Period Weeks   Status On-going   PT LONG TERM GOAL #2   Title cervical and right UE pain decreased >/= 75% so she is able to use her right arm more   Time 8   Period Weeks   Status On-going   PT LONG TERM GOAL #3   Title FOTO score </= 47% limitation   Time 8   Period Weeks   Status On-going   PT LONG TERM GOAL #4   Title numbness and burning pain in right lower arm reduced >/= 50%   Time 8   Period Weeks   Status On-going   PT LONG TERM GOAL #5   Title sleep with >/= 75% greater ease due to reduce pain   Time 8   Period Weeks   Status On-going               Plan - 09/26/15 1601    Clinical Impression Statement The patient reports right neck pain and posterior headaches.  Tender points noted in B suboccipitals, right upper trapezius and levator scap.  Improved muscle length following manual therapy and dry needling.  Recommend frequent cervical retractions and upper trap/levator stretching over the next 2 days, moist heat as needed.     PT Next Visit Plan assess response to dry needling #1;  joint mobilization of c-spine and thoracic spine;  deep flexor strengthening;  periscapular muscle strengthening;  possible traction        Problem List There are no active problems to display for this patient.   [name] 09/26/2015, 4:37 PM  [facility] [address], [place], [place], [address] Phone: [phone]   Fax:  [phone]  Name: [name] MRN: [id number] Date of Birth: [birth year]   Ruben Im, PT 09/26/2015 4:43 PM Phone: [phone] Fax: [phone]

## 2015-09-26 NOTE — Patient Instructions (Signed)

## 2015-10-01 ENCOUNTER — Other Ambulatory Visit: Payer: Self-pay

## 2015-10-01 ENCOUNTER — Ambulatory Visit: Payer: PRIVATE HEALTH INSURANCE | Admitting: Physical Therapy

## 2015-10-01 DIAGNOSIS — M542 Cervicalgia: Secondary | ICD-10-CM

## 2015-10-01 DIAGNOSIS — Z1231 Encounter for screening mammogram for malignant neoplasm of breast: Secondary | ICD-10-CM

## 2015-10-01 DIAGNOSIS — M5010 Cervical disc disorder with radiculopathy, unspecified cervical region: Secondary | ICD-10-CM

## 2015-10-01 DIAGNOSIS — R29898 Other symptoms and signs involving the musculoskeletal system: Secondary | ICD-10-CM

## 2015-10-01 NOTE — Therapy (Signed)
[facility] [address], [place], [place], [address] Phone: [phone]   Fax:  [phone]  Physical Therapy Treatment  Patient Details  Name: [name] MRN: [id number] Date of Birth: [birth year] Referring Provider: Dr. Suella Broad  Encounter Date: 10/01/2015      PT End of Session - 10/01/15 1642    Visit Number 4   Number of Visits 10   Date for PT Re-Evaluation 11/13/15   Authorization Type workers comp   Authorization - Number of Visits 10   PT Start Time 1100   PT Stop Time 1155   PT Time Calculation (min) 55 min   Activity Tolerance Patient tolerated treatment well      Past Medical History  Diagnosis Date  . Irregular heart beat     history - no treatment  Dr Wynonia Lawman  . Hyperthyroidism     no meds  . GERD (gastroesophageal reflux disease)   . Headache(784.0)     otc meds prn  . PONV (postoperative nausea and vomiting)   . Sinus tachycardia ([place])   . Urinary incontinence     urgency and night urination  . Right ovarian cyst 2016    CT scan - 1 cm, possible dermoid.    Past Surgical History  Procedure Laterality Date  . Anterior and posterior repair    . Incontinence surgery    . Nose surgery    . Shoulder surgery      bilateral left x 2, right x 1 surgery  . Upper gastrointestinal endoscopy    . Svd       x 1  . Dilation and curettage of uterus    . Diagnostic laparoscopy    . Wisdom tooth extraction    . Laparoscopy  06/12/2011    Procedure: LAPAROSCOPY OPERATIVE;  Surgeon: Arloa Koh;  Location: [facility];  Service: Gynecology;  Laterality: N/A;   Left Ovarian Cystectomy  . Endometrial ablation  05/2011    There were no vitals filed for this visit.  Visit Diagnosis:  Cervical pain  Cervical disc disorder with radiculopathy of cervical region  Weakness of right arm      Subjective Assessment - 10/01/15 1100    Subjective I was sore the next day but I feel like I have  more ROM.  Still have HA but didn't last as long.  Still with 4th/5th finger intermittently.     Currently in Pain? Yes   Pain Score 4    Pain Location Neck   Pain Orientation Right   Pain Type Chronic pain   Pain Onset More than a month ago   Pain Frequency Intermittent                         [facility] Adult PT Treatment/Exercise - 10/01/15 0001    Neck Exercises: Supine   Neck Retraction 10 reps   Capital Flexion 10 reps   Other Supine Exercise Scapular series with yellow band: flex with narrow and wide grip, HABD, diagonals, ER 8x each   Manual Therapy   Joint Mobilization C 4-7 PA grade grade 3    Soft tissue mobilization Right UT, levator, suboccipitals   Manual Traction 3x 60 sec   Muscle Energy Technique right upper trap contract/relax          Trigger Point Dry Needling - 10/01/15 1615    Upper Trapezius Response Twitch reponse elicited;Palpable increased muscle length  SubOccipitals Response Twitch response elicited;Palpable increased muscle length   Levator Scapulae Response Twitch response elicited;Palpable increased muscle length    Also Dn to B cervical multifidi          PT Education - 10/01/15 1616    Education provided Yes   Education Details supine scapular series with band   Person(s) Educated Patient   Methods Explanation;Demonstration;Handout   Comprehension Verbalized understanding;Returned demonstration          PT Short Term Goals - 10/01/15 1648    PT SHORT TERM GOAL #1   Title independent of intial HEP   Status Achieved   PT SHORT TERM GOAL #2   Title cervcial and right UE pain has decreased >/= 25% with daily activities   Time 4   Period Weeks   Status On-going   PT SHORT TERM GOAL #3   Title ability to use her right UE more due to increased strength   Time 4   Period Weeks   Status On-going           PT Long Term Goals - 10/01/15 1649    PT LONG TERM GOAL #1   Title Independent with HEP and unstands how  to progress herself   Time 8   Period Weeks   Status On-going   PT LONG TERM GOAL #2   Title cervical and right UE pain decreased >/= 75% so she is able to use her right arm more   Time 8   Period Weeks   Status On-going   PT LONG TERM GOAL #3   Title FOTO score </= 47% limitation   Time 8   Period Weeks   Status On-going   PT LONG TERM GOAL #4   Title numbness and burning pain in right lower arm reduced >/= 50%   Time 8   Period Weeks   Status On-going   PT LONG TERM GOAL #5   Title sleep with >/= 75% greater ease due to reduce pain   Time 8   Period Weeks   Status On-going               Plan - 10/01/15 1643    Clinical Impression Statement Patient reports improved ROM after last visit by "at least 2 inches" which she attributes to dry needling.  She is able to participate in scapular strengthening with theraband and deep flexor muscle strengthening without exacerbation of pain or production of peripheral symptoms.  Improving muscle length following treatment session today.  Has right increased lateral bony prominence C3.  Therapist closely monitoring response to all interventions.    PT Next Visit Plan assess response to dry needling #2;  recheck ROM and progress toward goals;  joint mobilization of c-spine and thoracic spine;  deep flexor strengthening;  periscapular muscle strengthening in prone;  wall push ups        Problem List There are no active problems to display for this patient.   [name] 10/01/2015, 4:50 PM  [facility] [address], [place], [place], [address] Phone: [phone]   Fax:  [phone]  Name: KADEESHA REIMER MRN: [id number] Date of Birth: 1971-05-10   Ruben Im, PT 10/01/2015 4:51 PM Phone: [phone] Fax: [phone]

## 2015-10-01 NOTE — Patient Instructions (Signed)
Over Head Pull: Narrow Grip       On back, knees bent, feet flat, band across thighs, elbows straight but relaxed. Pull hands apart (start). Keeping elbows straight, bring arms up and over head, hands toward floor. Keep pull steady on band. Hold momentarily. Return slowly, keeping pull steady, back to start. Repeat _8__ times. Band color _yellow_____   Side Pull: Double Arm   On back, knees bent, feet flat. Arms perpendicular to body, shoulder level, elbows straight but relaxed. Pull arms out to sides, elbows straight. Resistance band comes across collarbones, hands toward floor. Hold momentarily. Slowly return to starting position. Repeat __8_ times. Band color __yellow___   Sash   On back, knees bent, feet flat, left hand on left hip, right hand above left. Pull right arm DIAGONALLY (hip to shoulder) across chest. Bring right arm along head toward floor. Hold momentarily. Slowly return to starting position. Repeat __8_ times. Do with left arm. Band color _yellow_____   Shoulder Rotation: Double Arm   On back, knees bent, feet flat, elbows tucked at sides, bent 90, hands palms up. Pull hands apart and down toward floor, keeping elbows near sides. Hold momentarily. Slowly return to starting position. Repeat ___8 times. Band color ____yellow   [facility] [address] [place],  [address] Phone # [phone] Fax [phone]

## 2015-10-03 ENCOUNTER — Ambulatory Visit: Payer: PRIVATE HEALTH INSURANCE | Admitting: Physical Therapy

## 2015-10-03 DIAGNOSIS — R29898 Other symptoms and signs involving the musculoskeletal system: Secondary | ICD-10-CM

## 2015-10-03 DIAGNOSIS — M542 Cervicalgia: Secondary | ICD-10-CM

## 2015-10-03 DIAGNOSIS — M5010 Cervical disc disorder with radiculopathy, unspecified cervical region: Secondary | ICD-10-CM

## 2015-10-03 NOTE — Therapy (Signed)
[facility] [address] [place], [place], [address] Phone: [phone]   Fax:  [phone]  Physical Therapy Treatment  Patient Details  Name: [name] MRN: [id number] Date of Birth: 1971/06/08 Referring Provider: Dr. Suella Broad  Encounter Date: 10/03/2015      PT End of Session - 10/03/15 1223    Visit Number 5   Number of Visits 10   Date for PT Re-Evaluation 11/13/15   Authorization Type workers comp   PT Start Time 1106   PT Stop Time 1156   PT Time Calculation (min) 50 min   Activity Tolerance Patient tolerated treatment well      Past Medical History  Diagnosis Date  . Irregular heart beat     history - no treatment  Dr Wynonia Lawman  . Hyperthyroidism     no meds  . GERD (gastroesophageal reflux disease)   . Headache(784.0)     otc meds prn  . PONV (postoperative nausea and vomiting)   . Sinus tachycardia ([place])   . Urinary incontinence     urgency and night urination  . Right ovarian cyst 2016    CT scan - 1 cm, possible dermoid.    Past Surgical History  Procedure Laterality Date  . Anterior and posterior repair    . Incontinence surgery    . Nose surgery    . Shoulder surgery      bilateral left x 2, right x 1 surgery  . Upper gastrointestinal endoscopy    . Svd       x 1  . Dilation and curettage of uterus    . Diagnostic laparoscopy    . Wisdom tooth extraction    . Laparoscopy  06/12/2011    Procedure: LAPAROSCOPY OPERATIVE;  Surgeon: Arloa Koh;  Location: [facility];  Service: Gynecology;  Laterality: N/A;   Left Ovarian Cystectomy  . Endometrial ablation  05/2011    There were no vitals filed for this visit.  Visit Diagnosis:  Cervical pain  Cervical disc disorder with radiculopathy of cervical region  Weakness of right arm      Subjective Assessment - 10/03/15 1106    Subjective I'm a little sore but not bad.  Muscles felt tired.     Currently in Pain? Yes   Pain  Score 5    Pain Location Neck   Pain Type Chronic pain   Pain Onset More than a month ago            [facility] PT Assessment - 10/03/15 0001    AROM   Cervical Flexion 40   Cervical Extension 30   Cervical - Right Side Bend 40   Cervical - Left Side Bend 30   Cervical - Right Rotation 45   Cervical - Left Rotation 45                     [facility] Adult PT Treatment/Exercise - 10/03/15 0001    Shoulder Exercises: Prone   Other Prone Exercises Right shoulder extension, HABD and HABD at 120 degrees 10x each   Shoulder Exercises: Standing   Other Standing Exercises Wall push ups plus 12x   Moist Heat Therapy   Number Minutes Moist Heat 10 Minutes   Moist Heat Location Cervical   Manual Therapy   Manual Therapy Other (comment)   Joint Mobilization C 4-7 PA grade grade 3    Soft tissue mobilization Right UT, levator, suboccipitals  Manual Traction 3x 60 sec   Other Manual Therapy suboccipital release 3 min          Trigger Point Dry Needling - 10/03/15 1222    Consent Given? Yes   Muscles Treated Upper Body --  B cervical multifidi   Upper Trapezius Response Twitch reponse elicited;Palpable increased muscle length   SubOccipitals Response Palpable increased muscle length   Levator Scapulae Response Twitch response elicited;Palpable increased muscle length                PT Short Term Goals - 10/03/15 1230    PT SHORT TERM GOAL #1   Title independent of intial HEP   Status Achieved   PT SHORT TERM GOAL #2   Title cervcial and right UE pain has decreased >/= 25% with daily activities   Time 4   Period Weeks   Status Achieved   PT SHORT TERM GOAL #3   Title ability to use her right UE more due to increased strength   Time 4   Period Weeks   Status Achieved           PT Long Term Goals - 10/03/15 1230    PT LONG TERM GOAL #1   Title Independent with HEP and unstands how to progress herself   Time 8   Period Weeks   Status On-going   PT  LONG TERM GOAL #2   Title cervical and right UE pain decreased >/= 75% so she is able to use her right arm more   Time 8   Period Weeks   Status On-going   PT LONG TERM GOAL #3   Title FOTO score </= 47% limitation   Time 8   Period Weeks   Status On-going   PT LONG TERM GOAL #4   Title numbness and burning pain in right lower arm reduced >/= 50%   Time 8   Period Weeks   Status On-going   PT LONG TERM GOAL #5   Title sleep with >/= 75% greater ease due to reduce pain   Time 8   Period Weeks   Status On-going               Plan - 10/03/15 1224    Clinical Impression Statement The patient has improved cervical extension, right sidebending and B rotation since initial visit.  Improved soft tissue mobility and decreased tender points.   Visible and palpable right UE trembling with AROM exercises in prone, patient stating, "I didn't realize it was so weak!".  Progressing with rehab goals.  Therapist closely monitoring response to all  treatment interventions.     PT Next Visit Plan Assess response to DN #3;  add thoracic mobs, cervical lateral mobs with right upper ext neural glides;  UE strengthening  (add prone to HEP)         Problem List There are no active problems to display for this patient.   [name] 10/03/2015, 12:32 PM  [facility] [address] [place], [place], [address] Phone: [phone]   Fax:  [phone]  Name: [name] MRN: [id number] Date of Birth: [birth year]    Ruben Im, PT 10/03/2015 12:32 PM Phone: [phone] Fax: [phone]

## 2015-10-08 ENCOUNTER — Ambulatory Visit: Payer: PRIVATE HEALTH INSURANCE | Admitting: Physical Therapy

## 2015-10-08 DIAGNOSIS — M5010 Cervical disc disorder with radiculopathy, unspecified cervical region: Secondary | ICD-10-CM

## 2015-10-08 DIAGNOSIS — M542 Cervicalgia: Secondary | ICD-10-CM

## 2015-10-08 DIAGNOSIS — R29898 Other symptoms and signs involving the musculoskeletal system: Secondary | ICD-10-CM

## 2015-10-08 NOTE — Patient Instructions (Signed)
Light theraputty:  1) roll out to make a snake  2) pinch like making a pie crust  3) twist/turning  A key motion  4) flatten to a pancake  5) ball into palm, press finger into putty    Ruben Im PT [facility] [address] [place], [place] [address] Phone # [phone] Fax [phone]

## 2015-10-08 NOTE — Therapy (Signed)
[facility] [address] [place], [place], [address] Phone: [phone]   Fax:  [phone]  Physical Therapy Treatment  Patient Details  Name: [name] MRN: [id number] Date of Birth: 1971-07-08 Referring Provider: Dr. Suella Broad  Encounter Date: 10/08/2015      PT End of Session - 10/08/15 1203    Visit Number 6   Number of Visits 10   Date for PT Re-Evaluation 11/13/15   Authorization Type workers comp   PT Start Time 1100   PT Stop Time 1155   PT Time Calculation (min) 55 min   Activity Tolerance Patient tolerated treatment well      Past Medical History  Diagnosis Date  . Irregular heart beat     history - no treatment  Dr Wynonia Lawman  . Hyperthyroidism     no meds  . GERD (gastroesophageal reflux disease)   . Headache(784.0)     otc meds prn  . PONV (postoperative nausea and vomiting)   . Sinus tachycardia ([place])   . Urinary incontinence     urgency and night urination  . Right ovarian cyst 2016    CT scan - 1 cm, possible dermoid.    Past Surgical History  Procedure Laterality Date  . Anterior and posterior repair    . Incontinence surgery    . Nose surgery    . Shoulder surgery      bilateral left x 2, right x 1 surgery  . Upper gastrointestinal endoscopy    . Svd       x 1  . Dilation and curettage of uterus    . Diagnostic laparoscopy    . Wisdom tooth extraction    . Laparoscopy  06/12/2011    Procedure: LAPAROSCOPY OPERATIVE;  Surgeon: Arloa Koh;  Location: [facility];  Service: Gynecology;  Laterality: N/A;   Left Ovarian Cystectomy  . Endometrial ablation  05/2011    There were no vitals filed for this visit.  Visit Diagnosis:  Cervical pain  Cervical disc disorder with radiculopathy of cervical region  Weakness of right arm      Subjective Assessment - 10/08/15 1101    Subjective I've had a lot of headaches but it could be sinuses, even bothering me on days off.    I've gained range of motion but generally feel tired.     Currently in Pain? Yes   Pain Score 5    Pain Location Neck   Pain Orientation Right   Pain Type Chronic pain   Pain Onset More than a month ago   Pain Frequency Intermittent                         [facility] Adult PT Treatment/Exercise - 10/08/15 0001    Hand Exercises for Cervical Radiculopathy   Other Hand Exercise for Cervical Radiculopathy see patient instructions   Other Hand Exercise for Cervical Radiculopathy adductor finger squeezes and abductor with rubber band   Modalities   Modalities Electrical Stimulation   Moist Heat Therapy   Number Minutes Moist Heat 10 Minutes   Moist Heat Location Cervical   Electrical Stimulation   Electrical Stimulation Location cervical   Electrical Stimulation Action IFC   Electrical Stimulation Parameters 8 ma   Electrical Stimulation Goals Pain   Manual Therapy   Joint Mobilization C 4-7 PA grade grade 3    Soft tissue mobilization Right UT, levator, suboccipitalsGraston instrument assisted G4  fanning, sweeping and distraction   Manual Traction 3x 60 sec   Other Manual Therapy suboccipital release 3 min                PT Education - 10/08/15 1206    Education provided Yes   Education Details finger instrinsic strengthening   Person(s) Educated Patient   Methods Explanation;Demonstration;Handout   Comprehension Verbalized understanding          PT Short Term Goals - 10/08/15 1214    PT SHORT TERM GOAL #1   Title independent of intial HEP   Status Achieved   PT SHORT TERM GOAL #2   Title cervcial and right UE pain has decreased >/= 25% with daily activities   Status Achieved   PT SHORT TERM GOAL #3   Title ability to use her right UE more due to increased strength   Status Achieved           PT Long Term Goals - 10/08/15 1214    PT LONG TERM GOAL #1   Title Independent with HEP and unstands how to progress herself   Time 8   Period Weeks    Status On-going   PT LONG TERM GOAL #2   Title cervical and right UE pain decreased >/= 75% so she is able to use her right arm more   Time 8   Period Weeks   Status On-going   PT LONG TERM GOAL #3   Title FOTO score </= 47% limitation   Time 8   Period Weeks   Status On-going   PT LONG TERM GOAL #4   Title numbness and burning pain in right lower arm reduced >/= 50%   Time 8   Period Weeks   Status On-going   PT LONG TERM GOAL #5   Title sleep with >/= 75% greater ease due to reduce pain   Time 8   Period Weeks   Status On-going               Plan - 10/08/15 1206    Clinical Impression Statement The patient reports she has had a slight worsening of headache and general neck and arm tired feeling.  She is unsure if this is related to her neck problem or possible sinus region pain but the pain does radiate to the back of the head and neck.  Opted to hold on dry needling secondary to general discomfort today but she would like to resume with this intervention next week, as she feels she had a dramatic improvement in ROM ("the best in years.")  She has joint hypomobility C3-C5 with decreased pain with PA pressure at C4.  Patient instructed in finger instrinsic strenghening secondary to weakness related to radiculopathy.   Therapist closely monitoring response throughout treatment session.     PT Next Visit Plan soft tissue work to right cervical musculature;  PA cervical mobs and lateral glides;  right UE prox and distal strengthening;  possible cervical traction as needed        Problem List There are no active problems to display for this patient.   [name] 10/08/2015, 12:15 PM  [facility] [address] [place], [place], [address] Phone: [phone]   Fax:  [phone]  Name: [name] MRN: [id number] Date of Birth: 09-08-1970    Ruben Im, PT 10/08/2015 12:17 PM Phone:  [phone] Fax: [phone]

## 2015-10-10 ENCOUNTER — Ambulatory Visit: Payer: PRIVATE HEALTH INSURANCE | Admitting: Physical Therapy

## 2015-10-10 ENCOUNTER — Encounter: Payer: Self-pay | Admitting: Physical Therapy

## 2015-10-10 DIAGNOSIS — M542 Cervicalgia: Secondary | ICD-10-CM | POA: Diagnosis not present

## 2015-10-10 DIAGNOSIS — M5010 Cervical disc disorder with radiculopathy, unspecified cervical region: Secondary | ICD-10-CM

## 2015-10-10 DIAGNOSIS — R29898 Other symptoms and signs involving the musculoskeletal system: Secondary | ICD-10-CM

## 2015-10-10 NOTE — Therapy (Signed)
[facility] [address], [place], [place], [address] Phone: [phone]   Fax:  [phone]  Physical Therapy Treatment  Patient Details  Name: [name] MRN: [id number] Date of Birth: 11-23-1970 Referring Provider: Dr. Suella Broad  Encounter Date: 10/10/2015    Past Medical History  Diagnosis Date  . Irregular heart beat     history - no treatment  Dr Wynonia Lawman  . Hyperthyroidism     no meds  . GERD (gastroesophageal reflux disease)   . Headache(784.0)     otc meds prn  . PONV (postoperative nausea and vomiting)   . Sinus tachycardia ([place])   . Urinary incontinence     urgency and night urination  . Right ovarian cyst 2016    CT scan - 1 cm, possible dermoid.    Past Surgical History  Procedure Laterality Date  . Anterior and posterior repair    . Incontinence surgery    . Nose surgery    . Shoulder surgery      bilateral left x 2, right x 1 surgery  . Upper gastrointestinal endoscopy    . Svd       x 1  . Dilation and curettage of uterus    . Diagnostic laparoscopy    . Wisdom tooth extraction    . Laparoscopy  06/12/2011    Procedure: LAPAROSCOPY OPERATIVE;  Surgeon: Arloa Koh;  Location: [facility];  Service: Gynecology;  Laterality: N/A;   Left Ovarian Cystectomy  . Endometrial ablation  05/2011    There were no vitals filed for this visit.  Visit Diagnosis:  Cervical pain  Cervical disc disorder with radiculopathy of cervical region  Weakness of right arm                       [facility] Adult PT Treatment/Exercise - 10/10/15 0001    Posture/Postural Control   Posture/Postural Control Postural limitations   Postural Limitations Forward head;Rounded Shoulders   Modalities   Modalities --   Moist Heat Therapy   Number Minutes Moist Heat --   Moist Heat Location --   Acupuncturist Location --   Printmaker Action --   Electrical Stimulation Parameters --   Electrical Stimulation Goals --   Traction   Type of Traction Cervical   Min (lbs) 5   Max (lbs) 17   Hold Time 60   Rest Time 20   Time 10   Manual Therapy   Manual Therapy Soft tissue mobilization   Manual therapy comments to bil cer vical paraspinals, stenocleido & scalenies, UT Rt > Lt with PROM for elongation                  PT Short Term Goals - 10/08/15 1214    PT SHORT TERM GOAL #1   Title independent of intial HEP   Status Achieved   PT SHORT TERM GOAL #2   Title cervcial and right UE pain has decreased >/= 25% with daily activities   Status Achieved   PT SHORT TERM GOAL #3   Title ability to use her right UE more due to increased strength   Status Achieved           PT Long Term Goals - 10/08/15 1214    PT LONG TERM GOAL #1   Title Independent with HEP and unstands how to progress herself   Time 8   Period  Weeks   Status On-going   PT LONG TERM GOAL #2   Title cervical and right UE pain decreased >/= 75% so she is able to use her right arm more   Time 8   Period Weeks   Status On-going   PT LONG TERM GOAL #3   Title FOTO score </= 47% limitation   Time 8   Period Weeks   Status On-going   PT LONG TERM GOAL #4   Title numbness and burning pain in right lower arm reduced >/= 50%   Time 8   Period Weeks   Status On-going   PT LONG TERM GOAL #5   Title sleep with >/= 75% greater ease due to reduce pain   Time 8   Period Weeks   Status On-going               Problem List There are no active problems to display for this patient.   NAUMANN-HOUEGNIFIO,ELKE PTA 10/10/2015, 1:43 PM  [facility] [address], [place], [place], [address] Phone: [phone]   Fax:  [phone]  Name: [name] MRN: [id number] Date of Birth: [birth year]

## 2015-10-11 MED FILL — OXYCODONE/APAP 5-325: 5-325 | 30 days supply | Qty: 30 | Fill #0

## 2015-10-17 ENCOUNTER — Ambulatory Visit: Payer: PRIVATE HEALTH INSURANCE | Admitting: Physical Therapy

## 2015-10-17 DIAGNOSIS — M542 Cervicalgia: Secondary | ICD-10-CM

## 2015-10-17 DIAGNOSIS — M5010 Cervical disc disorder with radiculopathy, unspecified cervical region: Secondary | ICD-10-CM

## 2015-10-17 DIAGNOSIS — R29898 Other symptoms and signs involving the musculoskeletal system: Secondary | ICD-10-CM

## 2015-10-17 NOTE — Therapy (Signed)
[facility] [address] [place], [place], [address] Phone: [phone]   Fax:  [phone]  Physical Therapy Treatment  Patient Details  Name: [name] MRN: [id number] Date of Birth: 26-Jan-1971 Referring Provider: Dr. Suella Broad  Encounter Date: 10/17/2015      PT End of Session - 10/17/15 1214    Visit Number 7   Number of Visits 10   Date for PT Re-Evaluation 11/13/15   Authorization Type workers comp 10 visits approved   PT Start Time 1146   PT Stop Time 1230   PT Time Calculation (min) 44 min   Activity Tolerance Patient tolerated treatment well      Past Medical History  Diagnosis Date  . Irregular heart beat     history - no treatment  Dr Wynonia Lawman  . Hyperthyroidism     no meds  . GERD (gastroesophageal reflux disease)   . Headache(784.0)     otc meds prn  . PONV (postoperative nausea and vomiting)   . Sinus tachycardia ([place])   . Urinary incontinence     urgency and night urination  . Right ovarian cyst 2016    CT scan - 1 cm, possible dermoid.    Past Surgical History  Procedure Laterality Date  . Anterior and posterior repair    . Incontinence surgery    . Nose surgery    . Shoulder surgery      bilateral left x 2, right x 1 surgery  . Upper gastrointestinal endoscopy    . Svd       x 1  . Dilation and curettage of uterus    . Diagnostic laparoscopy    . Wisdom tooth extraction    . Laparoscopy  06/12/2011    Procedure: LAPAROSCOPY OPERATIVE;  Surgeon: Arloa Koh;  Location: [facility];  Service: Gynecology;  Laterality: N/A;   Left Ovarian Cystectomy  . Endometrial ablation  05/2011    There were no vitals filed for this visit.  Visit Diagnosis:  Cervical pain  Cervical disc disorder with radiculopathy of cervical region  Weakness of right arm      Subjective Assessment - 10/17/15 1154    Subjective Still with headaches but better than last week. No headache today.   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  ROM improvement has stayed.     Currently in Pain? Yes   Pain Score 4    Pain Location Neck                         [facility] Adult PT Treatment/Exercise - 10/17/15 0001    Shoulder Exercises: Prone   Other Prone Exercises Right shoulder extension, HABD and HABD at 120 degrees 10x each   Shoulder Exercises: Standing   Other Standing Exercises Wall push ups plus 12x   Moist Heat Therapy   Number Minutes Moist Heat 5 Minutes   Moist Heat Location Cervical   Electrical Stimulation   Electrical Stimulation Location cervical   Electrical Stimulation Action with dry needling Pre-mod level 1    Electrical Stimulation Parameters Level 1 10 min   Electrical Stimulation Goals Pain   Manual Therapy   Joint Mobilization C 4-7 PA grade grade 3    Soft tissue mobilization Right UT, levator,  distraction   Other Manual Therapy suboccipital release 3 min   Muscle Energy Technique right upper trap contract/relax  Trigger Point Dry Needling - 10/17/15 1213    Consent Given? Yes   Muscles Treated Upper Body --  B cervical multifidi   Upper Trapezius Response Twitch reponse elicited;Palpable increased muscle length   SubOccipitals Response Twitch response elicited;Palpable increased muscle length   Levator Scapulae Response Twitch response elicited;Palpable increased muscle length                PT Short Term Goals - 10/17/15 1355    PT SHORT TERM GOAL #1   Title independent of intial HEP   Status Achieved   PT SHORT TERM GOAL #2   Title cervcial and right UE pain has decreased >/= 25% with daily activities   Status Achieved   PT SHORT TERM GOAL #3   Title ability to use her right UE more due to increased strength   Status Achieved           PT Long Term Goals - 10/17/15 1355    PT LONG TERM GOAL #1   Title Independent with HEP and unstands how to progress herself   Time 8    Period Weeks   Status On-going   PT LONG TERM GOAL #2   Title cervical and right UE pain decreased >/= 75% so she is able to use her right arm more   Time 8   Period Weeks   Status On-going   PT LONG TERM GOAL #3   Title FOTO score </= 47% limitation   Time 8   Period Weeks   Status On-going   PT LONG TERM GOAL #4   Title numbness and burning pain in right lower arm reduced >/= 50%   Time 8   Period Weeks   Status On-going   PT LONG TERM GOAL #5   Title sleep with >/= 75% greater ease due to reduce pain   Time 8   Period Weeks   Status On-going               Plan - 10/17/15 1215    Clinical Impression Statement The patient has continued tenderness in right > left suboccipitals and cervical multifidi.  Multiple trigger points with twitch responses produced.  Improved muscle length following treatment session.  Right C3-4 rotation and hypomobility.   Therapist closely monitoring response throughout treatment session.     PT Next Visit Plan continue 3 more visits;  assess response to dry needling with e-stim response ;  cervical traction as needed;  UE strengthening        Problem List There are no active problems to display for this patient.   [name] 10/17/2015, 1:57 PM  [facility] [address], [place], [place], [address] Phone: [phone]   Fax:  [phone]  Name: [name] MRN: [id number] Date of Birth: 05-25-1971    Ruben Im, PT 10/17/2015 1:57 PM Phone: [phone] Fax: [phone]

## 2015-10-21 DIAGNOSIS — B029 Zoster without complications: Secondary | ICD-10-CM | POA: Diagnosis not present

## 2015-10-21 DIAGNOSIS — M545 Low back pain: Secondary | ICD-10-CM | POA: Diagnosis not present

## 2015-10-22 ENCOUNTER — Encounter: Payer: Self-pay | Admitting: Physical Therapy

## 2015-10-22 DIAGNOSIS — R21 Rash and other nonspecific skin eruption: Secondary | ICD-10-CM | POA: Diagnosis not present

## 2015-10-22 DIAGNOSIS — L738 Other specified follicular disorders: Secondary | ICD-10-CM | POA: Diagnosis not present

## 2015-10-22 DIAGNOSIS — L858 Other specified epidermal thickening: Secondary | ICD-10-CM | POA: Diagnosis not present

## 2015-10-22 DIAGNOSIS — L0101 Non-bullous impetigo: Secondary | ICD-10-CM | POA: Diagnosis not present

## 2015-10-24 ENCOUNTER — Ambulatory Visit: Payer: PRIVATE HEALTH INSURANCE | Admitting: Physical Therapy

## 2015-10-24 DIAGNOSIS — R29898 Other symptoms and signs involving the musculoskeletal system: Secondary | ICD-10-CM

## 2015-10-24 DIAGNOSIS — M542 Cervicalgia: Secondary | ICD-10-CM | POA: Diagnosis not present

## 2015-10-24 DIAGNOSIS — M5010 Cervical disc disorder with radiculopathy, unspecified cervical region: Secondary | ICD-10-CM

## 2015-10-24 NOTE — Therapy (Signed)
[facility] [address], [place], [place], [address] Phone: [phone]   Fax:  [phone]  Physical Therapy Treatment  Patient Details  Name: [name] MRN: [id number] Date of Birth: [birth year] Referring Provider: Dr. Suella Broad  Encounter Date: 10/24/2015      PT End of Session - 10/24/15 1325    Visit Number 8   Number of Visits 10   Date for PT Re-Evaluation 11/13/15   Authorization Type workers comp 10 visits approved   PT Start Time 1233   PT Stop Time 1315   PT Time Calculation (min) 42 min   Activity Tolerance Patient tolerated treatment well      Past Medical History  Diagnosis Date  . Irregular heart beat     history - no treatment  Dr Wynonia Lawman  . Hyperthyroidism     no meds  . GERD (gastroesophageal reflux disease)   . Headache(784.0)     otc meds prn  . PONV (postoperative nausea and vomiting)   . Sinus tachycardia ([employer])   . Urinary incontinence     urgency and night urination  . Right ovarian cyst 2016    CT scan - 1 cm, possible dermoid.    Past Surgical History  Procedure Laterality Date  . Anterior and posterior repair    . Incontinence surgery    . Nose surgery    . Shoulder surgery      bilateral left x 2, right x 1 surgery  . Upper gastrointestinal endoscopy    . Svd       x 1  . Dilation and curettage of uterus    . Diagnostic laparoscopy    . Wisdom tooth extraction    . Laparoscopy  06/12/2011    Procedure: LAPAROSCOPY OPERATIVE;  Surgeon: Arloa Koh;  Location: [facility];  Service: Gynecology;  Laterality: N/A;   Left Ovarian Cystectomy  . Endometrial ablation  05/2011    There were no vitals filed for this visit.  Visit Diagnosis:  Cervical pain  Cervical disc disorder with radiculopathy of cervical region  Weakness of right arm      Subjective Assessment - 10/24/15 1236    Subjective Had shingles on left thigh and left back pain.   That hurts more  than my neck.  My neck is good today.  I saw Dr. Nelva Bush and he could tell I've gained more motion.  I have at least a inch more rotation.  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     Currently in Pain? Yes   Pain Score 2    Pain Location Neck   Pain Orientation Right   Pain Type Chronic pain                         [facility] Adult PT Treatment/Exercise - 10/24/15 0001    Shoulder Exercises: Prone   Other Prone Exercises prone head lift with UE Is, Ts, M's, Ys 10x each   Shoulder Exercises: Standing   Extension Strengthening;Right;Left;20 reps;Theraband   Row Strengthening;Both;20 reps;Theraband   Theraband Level (Shoulder Row) Level 2 (Red)   Row Limitations vertical and horizontal   Retraction Limitations triceps red band 20x   Other Standing Exercises biceps red band 20x   Other Standing Exercises upright row with red band 20x   Hand Exercises for Cervical Radiculopathy   Pinch Grip Discussed using putty (patient has at home)  Finger ABduction with rubber band   Other Hand Exercise for Cervical Radiculopathy instrinsic motions with putty                PT Education - 10/24/15 1324    Education provided Yes   Education Details myotome strengthening with band in standing, scapular strengthening in prone   Person(s) Educated Patient   Methods Explanation;Demonstration   Comprehension Verbalized understanding;Returned demonstration          PT Short Term Goals - 10/24/15 1328    PT SHORT TERM GOAL #1   Title independent of intial HEP   Status Achieved   PT SHORT TERM GOAL #2   Title cervcial and right UE pain has decreased >/= 25% with daily activities   Status Achieved   PT SHORT TERM GOAL #3   Title ability to use her right UE more due to increased strength   Status Achieved           PT Long Term Goals - 10/24/15 1329    PT LONG TERM GOAL #1   Title Independent with HEP and unstands how to progress herself    Time 8   Period Weeks   Status On-going   PT LONG TERM GOAL #2   Title cervical and right UE pain decreased >/= 75% so she is able to use her right arm more   Time 8   Period Weeks   Status On-going   PT LONG TERM GOAL #3   Title FOTO score </= 47% limitation   Time 8   Period Weeks   Status On-going   PT LONG TERM GOAL #4   Title numbness and burning pain in right lower arm reduced >/= 50%   Time 8   Period Weeks   Status On-going   PT LONG TERM GOAL #5   Title sleep with >/= 75% greater ease due to reduce pain   Time 8   Period Weeks   Status On-going               Plan - 10/24/15 1325    Clinical Impression Statement The patient attributes her improved cervical rotation ROM to dry needling and states she has been able to maintain it.  Minimal pain today so treatment focus on strengthening in myotomes affected by cervical issue.  Muscular fatigue but no pain exacerbation.  Therapist closely monitoring postural alignment and pain response.     PT Next Visit Plan ROM;  continue with strengthening;  dry needling as needed;  2 more visits        Problem List There are no active problems to display for this patient.  Ruben Im, PT 10/24/2015 1:31 PM Phone: [phone] Fax: [phone] [name] 10/24/2015, 1:30 PM  [facility] [address] [place], [place], [address] Phone: [phone]   Fax:  [phone]  Name: [name] MRN: [id number] Date of Birth: Jan 03, 1971

## 2015-10-24 NOTE — Patient Instructions (Signed)
         10 reps of each    Ruben Im PT [facility] [address] [place], [place] [address] Phone # [phone] Fax [phone]

## 2015-10-29 MED FILL — FLUTICASONE PROP 50 MCG SPR: 50 | 30 days supply | Qty: 16 | Fill #1

## 2015-10-31 ENCOUNTER — Ambulatory Visit: Payer: PRIVATE HEALTH INSURANCE | Attending: Physical Medicine and Rehabilitation | Admitting: Physical Therapy

## 2015-10-31 DIAGNOSIS — M6281 Muscle weakness (generalized): Secondary | ICD-10-CM | POA: Diagnosis present

## 2015-10-31 DIAGNOSIS — M542 Cervicalgia: Secondary | ICD-10-CM | POA: Insufficient documentation

## 2015-10-31 DIAGNOSIS — M5412 Radiculopathy, cervical region: Secondary | ICD-10-CM | POA: Diagnosis present

## 2015-10-31 NOTE — Therapy (Signed)
[facility] [address], [place], [place], [address] Phone: [phone]   Fax:  [phone]  Physical Therapy Treatment  Patient Details  Name: [name] MRN: [id number] Date of Birth: 17-May-1971 Referring Provider: Dr. Suella Broad  Encounter Date: 10/31/2015      PT End of Session - 10/31/15 1137    Visit Number 9   Number of Visits 10   Date for PT Re-Evaluation 11/13/15   Authorization Type workers comp 10 visits approved   PT Start Time 1102   PT Stop Time 1155   PT Time Calculation (min) 53 min   Activity Tolerance Patient tolerated treatment well      Past Medical History  Diagnosis Date  . Irregular heart beat     history - no treatment  Dr Wynonia Lawman  . Hyperthyroidism     no meds  . GERD (gastroesophageal reflux disease)   . Headache(784.0)     otc meds prn  . PONV (postoperative nausea and vomiting)   . Sinus tachycardia ([place])   . Urinary incontinence     urgency and night urination  . Right ovarian cyst 2016    CT scan - 1 cm, possible dermoid.    Past Surgical History  Procedure Laterality Date  . Anterior and posterior repair    . Incontinence surgery    . Nose surgery    . Shoulder surgery      bilateral left x 2, right x 1 surgery  . Upper gastrointestinal endoscopy    . Svd       x 1  . Dilation and curettage of uterus    . Diagnostic laparoscopy    . Wisdom tooth extraction    . Laparoscopy  06/12/2011    Procedure: LAPAROSCOPY OPERATIVE;  Surgeon: Arloa Koh;  Location: [facility];  Service: Gynecology;  Laterality: N/A;   Left Ovarian Cystectomy  . Endometrial ablation  05/2011    There were no vitals filed for this visit.  Visit Diagnosis:  Cervicalgia  Muscle weakness (generalized)  Radiculopathy, cervical region      Subjective Assessment - 10/31/15 1102    Subjective (p) Shingles almost gone, burning on my hip persists.  My neck is sore.  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 Going on vacation next week.  Until yesterday, the dry needling has stopped a lot of my headaches.  No burning in arm.     Currently in Pain? (p) Yes   Pain Score (p) 8    Pain Location (p) Neck   Pain Orientation (p) Right;Left                         [facility] Adult PT Treatment/Exercise - 10/31/15 0001    Exercises   Exercises --  review of prone UE ex's and band HEP   Moist Heat Therapy   Number Minutes Moist Heat 10 Minutes   Moist Heat Location Cervical   Electrical Stimulation   Electrical Stimulation Location cervical   Electrical Stimulation Action with dry needling with pre-mod L1 1/2   Electrical Stimulation Parameters 10   min   Electrical Stimulation Goals Pain   Manual Therapy   Soft tissue mobilization Bilateral upper traps, levators, suboccipitals          Trigger Point Dry Needling - 10/31/15 1134    Consent Given? Yes   Muscles Treated Upper Body --  B cervical  multifidi   Upper Trapezius Response Twitch reponse elicited;Palpable increased muscle length   SubOccipitals Response Palpable increased muscle length   Levator Scapulae Response Twitch response elicited;Palpable increased muscle length     Performed bilaterally.           PT Short Term Goals - 10/31/15 1548    PT SHORT TERM GOAL #1   Title independent of intial HEP   Status Achieved   PT SHORT TERM GOAL #2   Title cervcial and right UE pain has decreased >/= 25% with daily activities   Status Achieved   PT SHORT TERM GOAL #3   Title ability to use her right UE more due to increased strength   Status Achieved           PT Long Term Goals - 10/31/15 1548    PT LONG TERM GOAL #1   Title Independent with HEP and unstands how to progress herself   Time 8   Period Weeks   Status On-going   PT LONG TERM GOAL #2   Title cervical and right UE pain decreased >/= 75% so she is able to use her right arm more   Time 8   Period Weeks    Status On-going   PT LONG TERM GOAL #3   Title FOTO score </= 47% limitation   Time 8   Period Weeks   Status On-going   PT LONG TERM GOAL #4   Title numbness and burning pain in right lower arm reduced >/= 50%   Time 8   Period Weeks   Status On-going   PT LONG TERM GOAL #5   Title sleep with >/= 75% greater ease due to reduce pain   Time 8   Period Weeks   Status On-going               Plan - 10/31/15 1137    Clinical Impression Statement The patient reports her arm is getting stronger with less distal symptoms.  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  Tender points in bilateral upper traps, levators and suboccipitals.  Improved muscle length following treatment session.  Therapist closely monitoring response to all interventions.    She is going out of town next week.  Upon return will recheck cervical AROM, strength and remaining goals for possible discharge next visit.     PT Next Visit Plan ROM;  continue with strengthening;  dry needling as needed;  1 more visit;  check ROM; progress toward goals              --    --    --    --      Problem List There are no active problems to display for this patient. Ruben Im, PT 10/31/2015 3:55 PM Phone: [phone] Fax: [phone]  [name] 10/31/2015, 3:54 PM  [facility] [address], [place], [place], [address] Phone: [phone]   Fax:  [phone]  Name: [name] MRN: [id number] Date of Birth: March 20, 1971

## 2015-11-11 MED FILL — OXYCODONE/APAP 5-325: 5-325 | 30 days supply | Qty: 30 | Fill #0

## 2015-11-11 MED FILL — METHOCARBAMOL 500 MG TABLET: 500 | 10 days supply | Qty: 30 | Fill #0

## 2015-11-11 MED FILL — DICLOFENAC SODIUM 1% GEL: 1 | 30 days supply | Qty: 300 | Fill #0

## 2015-11-14 ENCOUNTER — Ambulatory Visit: Payer: PRIVATE HEALTH INSURANCE | Admitting: Physical Therapy

## 2015-11-14 DIAGNOSIS — M542 Cervicalgia: Secondary | ICD-10-CM

## 2015-11-14 DIAGNOSIS — M5412 Radiculopathy, cervical region: Secondary | ICD-10-CM

## 2015-11-14 DIAGNOSIS — M6281 Muscle weakness (generalized): Secondary | ICD-10-CM

## 2015-11-14 NOTE — Therapy (Signed)
[facility] [address] [place], [place], [address] Phone: [phone]   Fax:  [phone]  Physical Therapy Treatment/Recertification  Patient Details  Name: [name] MRN: [id number] Date of Birth: Aug 04, 1970 Referring Provider: Dr. Suella Broad  Encounter Date: 11/14/2015      PT End of Session - 11/14/15 0947    Visit Number 10   Number of Visits 20   Date for PT Re-Evaluation 12/26/15   Authorization Type workers comp 10 visits approved   PT Start Time [phone]   PT Stop Time 0945   PT Time Calculation (min) 58 min   Activity Tolerance Patient tolerated treatment well      Past Medical History  Diagnosis Date  . Irregular heart beat     history - no treatment  Dr Wynonia Lawman  . Hyperthyroidism     no meds  . GERD (gastroesophageal reflux disease)   . Headache(784.0)     otc meds prn  . PONV (postoperative nausea and vomiting)   . Sinus tachycardia ([place])   . Urinary incontinence     urgency and night urination  . Right ovarian cyst 2016    CT scan - 1 cm, possible dermoid.    Past Surgical History  Procedure Laterality Date  . Anterior and posterior repair    . Incontinence surgery    . Nose surgery    . Shoulder surgery      bilateral left x 2, right x 1 surgery  . Upper gastrointestinal endoscopy    . Svd       x 1  . Dilation and curettage of uterus    . Diagnostic laparoscopy    . Wisdom tooth extraction    . Laparoscopy  06/12/2011    Procedure: LAPAROSCOPY OPERATIVE;  Surgeon: Arloa Koh;  Location: [facility];  Service: Gynecology;  Laterality: N/A;   Left Ovarian Cystectomy  . Endometrial ablation  05/2011    There were no vitals filed for this visit.      Subjective Assessment - 11/14/15 0855    Subjective Saw Dr. Sherwood Gambler Monday and he said he would extend therapy.  I think the therapy has helped a lot, especially the dry needling.  Hurting more today because had heavier  patients.  Headaches 50% better.  No numbness for a while until the last few days but still > 50% better.     Currently in Pain? Yes   Pain Score 6    Pain Location Neck   Pain Orientation Right   Pain Type Chronic pain   Pain Onset More than a month ago   Pain Frequency Intermittent            [facility] PT Assessment - 11/14/15 0001    Observation/Other Assessments   Focus on Therapeutic Outcomes (FOTO)  48%   AROM   Cervical Flexion 45   Cervical Extension 22   Cervical - Right Side Bend 36   Cervical - Left Side Bend 34   Cervical - Right Rotation 45   Cervical - Left Rotation 45   Strength   Overall Strength --  right shoulder flex and abd 4/5; HABD 4/5   Right Shoulder Internal Rotation 4/5   Right Shoulder External Rotation 4/5   Right/Left Elbow Right   Right Elbow Flexion 5/5   Right/Left Wrist Right   Right Hand Gross Grasp --  40#   Left Hand Gross Grasp Impaired  47#  [facility] Adult PT Treatment/Exercise - 11/14/15 0001    Moist Heat Therapy   Number Minutes Moist Heat 5 Minutes   Moist Heat Location Cervical   Manual Therapy   Soft tissue mobilization Bilateral upper traps, levators, suboccipitals          Trigger Point Dry Needling - 11/14/15 0945    Consent Given? Yes   Muscles Treated Upper Body --  right cervical multifidi   Upper Trapezius Response Twitch reponse elicited;Palpable increased muscle length   SubOccipitals Response Palpable increased muscle length   Levator Scapulae Response Twitch response elicited;Palpable increased muscle length                PT Short Term Goals - 11/14/15 0954    PT SHORT TERM GOAL #1   Title independent of intial HEP   Status Achieved   PT SHORT TERM GOAL #2   Title cervcial and right UE pain has decreased >/= 25% with daily activities   Status Achieved   PT SHORT TERM GOAL #3   Title ability to use her right UE more due to increased strength   Status Achieved            PT Long Term Goals - 11/14/15 0915    PT LONG TERM GOAL #1   Title Independent with HEP and unstands how to progress herself   Status Achieved   PT LONG TERM GOAL #2   Title cervical and right UE pain decreased >/= 75% so she is able to use her right arm more   Time 8   Period Weeks   Status On-going   PT LONG TERM GOAL #3   Title FOTO score </= 47% limitation   Time 8   Period Weeks   Status On-going   PT LONG TERM GOAL #4   Title numbness and burning pain in right lower arm reduced >/= 50%   Status Achieved   PT LONG TERM GOAL #5   Baseline 50% better   Time 8   Period Weeks   Status On-going               Plan - 11/14/15 0947    Clinical Impression Statement The patient reports her overall improvement in pain, distal symptoms, sleep at > 50%.  She states she has a noticeable improvement in ROM especially rotation.  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  She states she feels manual therapy and strengthening have helped in addition to dry needling.  Cervical AROM improved but with continued shoulder and grip weakness on right.   Angie states she saw Dr. Sherwood Gambler and they discussed continuing PT.  She could benefit from 1-2 more sessions of dry needling but other sessions would focus on strengthening.  Will await W/C approval before scheduling addittional visits.     Rehab Potential Excellent   PT Frequency 2x / week   PT Duration 6 weeks   PT Treatment/Interventions Cryotherapy;Electrical Stimulation;Iontophoresis 4mg /ml Dexamethasone;Moist Heat;Traction;Ultrasound;Contrast Bath;Therapeutic exercise;Therapeutic activities;Functional mobility training;Neuromuscular re-education;Patient/family education;Manual techniques;Passive range of motion;Dry needling   PT Next Visit Plan manual therapy; 1-2 dry needling visits but primary focus on upper  quarter strengthening      Patient will benefit from skilled therapeutic intervention in order to improve the following deficits and impairments:  Increased fascial restricitons, Pain, Decreased mobility, Increased muscle spasms, Postural dysfunction, Decreased activity tolerance, Decreased  endurance, Decreased range of motion, Decreased strength  Visit Diagnosis: Cervicalgia - Plan: PT plan of care cert/re-cert  Muscle weakness (generalized) - Plan: PT plan of care cert/re-cert  Radiculopathy, cervical region - Plan: PT plan of care cert/re-cert     Problem List There are no active problems to display for this patient.  Ruben Im, PT 11/14/2015 10:02 AM Phone: [phone] Fax: [phone]   [name] 11/14/2015, 10:02 AM  [facility] [address] [place], [place], [address] Phone: [phone]   Fax:  [phone]  Name: WANNETTA LUPFER MRN: [id number] Date of Birth: 03/05/1971

## 2015-11-21 DIAGNOSIS — G894 Chronic pain syndrome: Secondary | ICD-10-CM | POA: Diagnosis not present

## 2015-11-21 MED FILL — VYVANSE 20 MG CAPSULE: 20 | 30 days supply | Qty: 30 | Fill #0

## 2015-12-05 MED FILL — FLUTICASONE PROP 50 MCG SPR: 50 | 30 days supply | Qty: 16 | Fill #2

## 2015-12-10 ENCOUNTER — Encounter: Payer: Self-pay | Admitting: Physical Therapy

## 2015-12-10 ENCOUNTER — Ambulatory Visit: Payer: PRIVATE HEALTH INSURANCE | Attending: Physical Medicine and Rehabilitation | Admitting: Physical Therapy

## 2015-12-10 DIAGNOSIS — M6281 Muscle weakness (generalized): Secondary | ICD-10-CM

## 2015-12-10 DIAGNOSIS — M542 Cervicalgia: Secondary | ICD-10-CM | POA: Diagnosis present

## 2015-12-10 DIAGNOSIS — M5412 Radiculopathy, cervical region: Secondary | ICD-10-CM | POA: Diagnosis present

## 2015-12-10 NOTE — Therapy (Signed)
[facility] [address], [place], [place], [address] Phone: [phone]   Fax:  [phone]  Physical Therapy Treatment  Patient Details  Name: [name] MRN: [id number] Date of Birth: 03-Jan-1971 Referring Provider: Dr. Suella Broad  Encounter Date: 12/10/2015      PT End of Session - 12/10/15 1200    Visit Number 11   Number of Visits 20   Date for PT Re-Evaluation 12/26/15   Authorization Type workers comp 10 visits approved   Authorization - Visit Number 3   Authorization - Number of Visits 10   PT Start Time 1146   PT Stop Time 1233   PT Time Calculation (min) 47 min   Activity Tolerance Patient tolerated treatment well   Behavior During Therapy [facility] for tasks assessed/performed      Past Medical History  Diagnosis Date  . Irregular heart beat     history - no treatment  Dr Wynonia Lawman  . Hyperthyroidism     no meds  . GERD (gastroesophageal reflux disease)   . Headache(784.0)     otc meds prn  . PONV (postoperative nausea and vomiting)   . Sinus tachycardia ([place])   . Urinary incontinence     urgency and night urination  . Right ovarian cyst 2016    CT scan - 1 cm, possible dermoid.    Past Surgical History  Procedure Laterality Date  . Anterior and posterior repair    . Incontinence surgery    . Nose surgery    . Shoulder surgery      bilateral left x 2, right x 1 surgery  . Upper gastrointestinal endoscopy    . Svd       x 1  . Dilation and curettage of uterus    . Diagnostic laparoscopy    . Wisdom tooth extraction    . Laparoscopy  06/12/2011    Procedure: LAPAROSCOPY OPERATIVE;  Surgeon: Arloa Koh;  Location: [facility];  Service: Gynecology;  Laterality: N/A;   Left Ovarian Cystectomy  . Endometrial ablation  05/2011    There were no vitals filed for this visit.      Subjective Assessment - 12/10/15 1153    Subjective Pt reports compliance with HEP. Pt continues to  expierience headaches and stiffness esspecialy with laps in treatment time   Limitations Lifting;Reading;House hold activities   Diagnostic tests MRI showed C4-C5 with bulging disc   Currently in Pain? Yes   Pain Score 7    Pain Location Neck   Pain Orientation Right   Pain Descriptors / Indicators Numbness   Pain Type Chronic pain   Pain Radiating Towards down right arm   Pain Onset More than a month ago   Pain Frequency Intermittent   Aggravating Factors  lifting, laying down at night   Pain Relieving Factors pressure on the right, not lifting   Effect of Pain on Daily Activities daily activities                         [facility] Adult PT Treatment/Exercise - 12/10/15 0001    Neck Exercises: Supine   Other Supine Exercise selfmob in supine with towelroll x 2 min, overheaed flexion  x 10   Shoulder Exercises: Prone   Other Prone Exercises Plank x 3 20sec hold   Traction   Type of Traction Cervical   Min (lbs) 5   Max (lbs) 17   Hold  Time 60   Rest Time 20   Time 10   Manual Therapy   Manual Therapy Soft tissue mobilization   Manual therapy comments to bil cer vical paraspinals, stenocleido & scalenies, UT Rt > Lt with PROM for elongation                  PT Short Term Goals - 12/10/15 1204    PT SHORT TERM GOAL #1   Title independent of intial HEP   Time 4   Period Weeks   Status Achieved   PT SHORT TERM GOAL #2   Title cervcial and right UE pain has decreased >/= 25% with daily activities   Time 4   Period Weeks   Status Achieved   PT SHORT TERM GOAL #3   Title ability to use her right UE more due to increased strength   Time 4   Period Weeks   Status Achieved           PT Long Term Goals - 11/14/15 0915    PT LONG TERM GOAL #1   Title Independent with HEP and unstands how to progress herself   Status Achieved   PT LONG TERM GOAL #2   Title cervical and right UE pain decreased >/= 75% so she is able to use her right arm more    Time 8   Period Weeks   Status On-going   PT LONG TERM GOAL #3   Title FOTO score </= 47% limitation   Time 8   Period Weeks   Status On-going   PT LONG TERM GOAL #4   Title numbness and burning pain in right lower arm reduced >/= 50%   Status Achieved   PT LONG TERM GOAL #5   Baseline 50% better   Time 8   Period Weeks   Status On-going               Plan - 12/10/15 1200    Clinical Impression Statement Pt reports she noticed increase stiffness in neck during the laps in therapie while waiting on approval for visits. Pt will continue to benefit from skilled PT for dry needeling, manual therapy, muscle relaxation.   Rehab Potential Excellent   Clinical Impairments Affecting Rehab Potential None   PT Frequency 2x / week   PT Duration 6 weeks   PT Next Visit Plan manual therapy; 1-2 dry needling visits but primary focus on upper quarter strengthening   PT Home Exercise Plan ROM exercises, cervical stretches   Consulted and Agree with Plan of Care Patient      Patient will benefit from skilled therapeutic intervention in order to improve the following deficits and impairments:  Increased fascial restricitons, Pain, Decreased mobility, Increased muscle spasms, Postural dysfunction, Decreased activity tolerance, Decreased endurance, Decreased range of motion, Decreased strength  Visit Diagnosis: Cervicalgia  Muscle weakness (generalized)  Radiculopathy, cervical region     Problem List There are no active problems to display for this patient.   NAUMANN-HOUEGNIFIO,ELKE PTA 12/10/2015, 12:33 PM  [facility] [address] [place], [place], [address] Phone: [phone]   Fax:  [phone]  Name: [name] MRN: [id number] Date of Birth: 12-19-1970

## 2015-12-12 ENCOUNTER — Encounter: Payer: Self-pay | Admitting: Physical Therapy

## 2015-12-12 ENCOUNTER — Ambulatory Visit: Payer: PRIVATE HEALTH INSURANCE | Admitting: Physical Therapy

## 2015-12-12 DIAGNOSIS — M542 Cervicalgia: Secondary | ICD-10-CM

## 2015-12-12 DIAGNOSIS — M5412 Radiculopathy, cervical region: Secondary | ICD-10-CM

## 2015-12-12 DIAGNOSIS — M6281 Muscle weakness (generalized): Secondary | ICD-10-CM

## 2015-12-12 NOTE — Therapy (Signed)
[facility] [address] [place], [place], [address] Phone: [phone]   Fax:  [phone]  Physical Therapy Treatment  Patient Details  Name: [name] MRN: [id number] Date of Birth: [birth year] Referring Provider: Dr. Suella Broad  Encounter Date: 12/12/2015      PT End of Session - 12/12/15 1126    Visit Number 12   Number of Visits 20   Date for PT Re-Evaluation 12/26/15   Authorization Type workers comp 10 visits approved   Authorization - Visit Number 4   Authorization - Number of Visits 10   PT Start Time 1109   PT Stop Time 1200   PT Time Calculation (min) 51 min   Activity Tolerance Patient tolerated treatment well   Behavior During Therapy [facility] for tasks assessed/performed      Past Medical History  Diagnosis Date  . Irregular heart beat     history - no treatment  Dr Wynonia Lawman  . Hyperthyroidism     no meds  . GERD (gastroesophageal reflux disease)   . Headache(784.0)     otc meds prn  . PONV (postoperative nausea and vomiting)   . Sinus tachycardia ([place])   . Urinary incontinence     urgency and night urination  . Right ovarian cyst 2016    CT scan - 1 cm, possible dermoid.    Past Surgical History  Procedure Laterality Date  . Anterior and posterior repair    . Incontinence surgery    . Nose surgery    . Shoulder surgery      bilateral left x 2, right x 1 surgery  . Upper gastrointestinal endoscopy    . Svd       x 1  . Dilation and curettage of uterus    . Diagnostic laparoscopy    . Wisdom tooth extraction    . Laparoscopy  06/12/2011    Procedure: LAPAROSCOPY OPERATIVE;  Surgeon: Arloa Koh;  Location: [facility];  Service: Gynecology;  Laterality: N/A;   Left Ovarian Cystectomy  . Endometrial ablation  05/2011    There were no vitals filed for this visit.      Subjective Assessment - 12/12/15 1118    Subjective Headache is a little better today, rated as 4/10. Pt  reports stiffness in upper back   Limitations Lifting;Reading;House hold activities   Diagnostic tests MRI showed C4-C5 with bulging disc   Currently in Pain? Yes   Pain Score 4    Pain Location Neck   Pain Orientation Right   Pain Descriptors / Indicators Numbness   Pain Type Chronic pain   Pain Onset More than a month ago   Pain Frequency Intermittent   Aggravating Factors  lifting, laying down at night   Pain Relieving Factors pressure on the right, not lifting   Effect of Pain on Daily Activities daily activities   Multiple Pain Sites No                         [facility] Adult PT Treatment/Exercise - 12/12/15 0001    Neck Exercises: Supine   Capital Flexion 20 reps;3 secs  with small towel on OA   Other Supine Exercise selfmob in supine with towelroll x 2 min, overheaed flexion  x 10   Other Supine Exercise Foam roll x 3 min   Traction   Type of Traction Cervical   Min (lbs) 5   Max (lbs) 17  Hold Time 60   Rest Time 20   Time 10   Manual Therapy   Manual Therapy Soft tissue mobilization   Manual therapy comments to bil cer vical paraspinals, stenocleido & scalenies, UT Rt > Lt with PROM for elongation                  PT Short Term Goals - 12/10/15 1204    PT SHORT TERM GOAL #1   Title independent of intial HEP   Time 4   Period Weeks   Status Achieved   PT SHORT TERM GOAL #2   Title cervcial and right UE pain has decreased >/= 25% with daily activities   Time 4   Period Weeks   Status Achieved   PT SHORT TERM GOAL #3   Title ability to use her right UE more due to increased strength   Time 4   Period Weeks   Status Achieved           PT Long Term Goals - 11/14/15 0915    PT LONG TERM GOAL #1   Title Independent with HEP and unstands how to progress herself   Status Achieved   PT LONG TERM GOAL #2   Title cervical and right UE pain decreased >/= 75% so she is able to use her right arm more   Time 8   Period Weeks   Status  On-going   PT LONG TERM GOAL #3   Title FOTO score </= 47% limitation   Time 8   Period Weeks   Status On-going   PT LONG TERM GOAL #4   Title numbness and burning pain in right lower arm reduced >/= 50%   Status Achieved   PT LONG TERM GOAL #5   Baseline 50% better   Time 8   Period Weeks   Status On-going               Plan - 12/12/15 1127    Clinical Impression Statement Pt reports she notices improvement with headaches and less numbness. Pt will continue to benefit from skilled PT for manual therapy, dry needeling and muscle relaxation   Rehab Potential Excellent   Clinical Impairments Affecting Rehab Potential None   PT Frequency 2x / week   PT Duration 6 weeks   PT Treatment/Interventions Cryotherapy;Electrical Stimulation;Iontophoresis 4mg /ml Dexamethasone;Moist Heat;Traction;Ultrasound;Contrast Bath;Therapeutic exercise;Therapeutic activities;Functional mobility training;Neuromuscular re-education;Patient/family education;Manual techniques;Passive range of motion;Dry needling   PT Next Visit Plan manual therapy; 1-2 dry needling visits but primary focus on upper quarter strengthening   PT Home Exercise Plan ROM exercises, cervical stretches   Consulted and Agree with Plan of Care Patient      Patient will benefit from skilled therapeutic intervention in order to improve the following deficits and impairments:  Increased fascial restricitons, Pain, Decreased mobility, Increased muscle spasms, Postural dysfunction, Decreased activity tolerance, Decreased endurance, Decreased range of motion, Decreased strength  Visit Diagnosis: Cervicalgia  Muscle weakness (generalized)  Radiculopathy, cervical region     Problem List There are no active problems to display for this patient.   NAUMANN-HOUEGNIFIO,ELKE PTA 12/12/2015, 1:20 PM  [facility] [address] [place], [place], [address] Phone:  [phone]   Fax:  [phone]  Name: [name] MRN: [id number] Date of Birth: November 16, 1970

## 2015-12-13 MED FILL — OXYCODONE/APAP 5-325: 5-325 | 30 days supply | Qty: 30 | Fill #0

## 2015-12-13 MED FILL — DICLOFENAC SODIUM 1% GEL: 1 | 30 days supply | Qty: 300 | Fill #0

## 2015-12-17 ENCOUNTER — Encounter: Payer: Self-pay | Admitting: Physical Therapy

## 2015-12-19 ENCOUNTER — Ambulatory Visit: Payer: PRIVATE HEALTH INSURANCE | Admitting: Physical Therapy

## 2015-12-19 DIAGNOSIS — M542 Cervicalgia: Secondary | ICD-10-CM | POA: Diagnosis not present

## 2015-12-19 DIAGNOSIS — M6281 Muscle weakness (generalized): Secondary | ICD-10-CM

## 2015-12-19 NOTE — Therapy (Signed)
[facility] [address], [place], [place], [address] Phone: [phone]   Fax:  [phone]  Physical Therapy Treatment  Patient Details  Name: [name] MRN: [id number] Date of Birth: 28-Dec-1970 Referring Provider: Dr. Suella Broad  Encounter Date: 12/19/2015      PT End of Session - 12/19/15 1219    Visit Number 13   Number of Visits 18   Date for PT Re-Evaluation 12/26/15   Authorization Type workers comp 18 visits approved   PT Start Time 1026   PT Stop Time 1110   PT Time Calculation (min) 44 min   Activity Tolerance Patient tolerated treatment well      Past Medical History  Diagnosis Date  . Irregular heart beat     history - no treatment  Dr Wynonia Lawman  . Hyperthyroidism     no meds  . GERD (gastroesophageal reflux disease)   . Headache(784.0)     otc meds prn  . PONV (postoperative nausea and vomiting)   . Sinus tachycardia ([place])   . Urinary incontinence     urgency and night urination  . Right ovarian cyst 2016    CT scan - 1 cm, possible dermoid.    Past Surgical History  Procedure Laterality Date  . Anterior and posterior repair    . Incontinence surgery    . Nose surgery    . Shoulder surgery      bilateral left x 2, right x 1 surgery  . Upper gastrointestinal endoscopy    . Svd       x 1  . Dilation and curettage of uterus    . Diagnostic laparoscopy    . Wisdom tooth extraction    . Laparoscopy  06/12/2011    Procedure: LAPAROSCOPY OPERATIVE;  Surgeon: Arloa Koh;  Location: [facility];  Service: Gynecology;  Laterality: N/A;   Left Ovarian Cystectomy  . Endometrial ablation  05/2011    There were no vitals filed for this visit.      Subjective Assessment - 12/19/15 1028    Subjective I've done something to the right muscle in my neck.  Possibly wrong pillow ???   Headache is doing better.  Patient arrives 11 min late.  Working night shift this week.   Currently in Pain?  Yes   Pain Score 8    Pain Location Neck   Pain Orientation Right   Pain Type Chronic pain            [facility] PT Assessment - 12/19/15 0001    AROM   Cervical Flexion 40   Cervical Extension 10   Cervical - Right Side Bend 40   Cervical - Left Side Bend 15   Cervical - Right Rotation 40   Cervical - Left Rotation 45                     [facility] Adult PT Treatment/Exercise - 12/19/15 0001    Moist Heat Therapy   Number Minutes Moist Heat 10 Minutes   Moist Heat Location Cervical   Electrical Stimulation   Electrical Stimulation Location cervical   Electrical Stimulation Action with dry needles to right multifidi   Electrical Stimulation Parameters L1.5   Electrical Stimulation Goals Pain   Manual Therapy   Soft tissue mobilization Right upper traps, levators; suboccipitals   Manual Traction 3x 30 sec   Muscle Energy Technique right upper trap contract/relax  Trigger Point Dry Needling - 12/19/15 1217    Consent Given? Yes   Muscles Treated Upper Body --  right cervical multifidi   Upper Trapezius Response Twitch reponse elicited;Palpable increased muscle length   SubOccipitals Response Twitch response elicited;Palpable increased muscle length   Levator Scapulae Response Twitch response elicited;Palpable increased muscle length                PT Short Term Goals - 12/19/15 1654    PT SHORT TERM GOAL #1   Title independent of intial HEP   Status Achieved   PT SHORT TERM GOAL #2   Title cervcial and right UE pain has decreased >/= 25% with daily activities   Status Achieved   PT SHORT TERM GOAL #3   Title ability to use her right UE more due to increased strength   Status Achieved           PT Long Term Goals - 12/19/15 1654    PT LONG TERM GOAL #1   Title Independent with HEP and unstands how to progress herself   Status Achieved   PT LONG TERM GOAL #2   Title cervical and right UE pain decreased >/= 75% so she is able to use  her right arm more   Time 8   Period Weeks   Status On-going   PT LONG TERM GOAL #3   Title FOTO score </= 47% limitation   Time 8   Period Weeks   Status On-going   PT LONG TERM GOAL #4   Title numbness and burning pain in right lower arm reduced >/= 50%   Status Achieved   PT LONG TERM GOAL #5   Title sleep with >/= 75% greater ease due to reduce pain   Time 8   Period Weeks   Status On-going               Plan - 12/19/15 1357    Clinical Impression Statement The patient is very stiff with cervical extension and left sidebending AROM today secondary to exacerbation of pain in right neck musculature.  Multiple tender points in levator, upper traps, suboccipitals.  Improved muscle length following treatment session.  Recommend continued manual therapy 2-3 more visits and progression to upper quarter strengthening as pain allows.     PT Next Visit Plan manual therapy;  dry needling with or without e-stim;  progress to upper quarter strengthening      Patient will benefit from skilled therapeutic intervention in order to improve the following deficits and impairments:     Visit Diagnosis: Cervicalgia  Muscle weakness (generalized)     Problem List There are no active problems to display for this patient.  Ruben Im, PT 12/19/2015 4:57 PM Phone: [phone] Fax: [phone]  [name] 12/19/2015, 4:56 PM  [facility] [address], [place], [place], [address] Phone: [phone]   Fax:  [phone]  Name: [name] MRN: [id number] Date of Birth: [birth year]

## 2015-12-25 MED FILL — OMEPRAZOLE DR 20 MG CAPSULE: 20 | 30 days supply | Qty: 30 | Fill #0

## 2015-12-26 ENCOUNTER — Ambulatory Visit: Payer: PRIVATE HEALTH INSURANCE | Attending: Physical Medicine and Rehabilitation | Admitting: Physical Therapy

## 2015-12-26 DIAGNOSIS — M6281 Muscle weakness (generalized): Secondary | ICD-10-CM | POA: Insufficient documentation

## 2015-12-26 DIAGNOSIS — M5412 Radiculopathy, cervical region: Secondary | ICD-10-CM | POA: Insufficient documentation

## 2015-12-26 DIAGNOSIS — M542 Cervicalgia: Secondary | ICD-10-CM | POA: Insufficient documentation

## 2015-12-26 NOTE — Therapy (Signed)
[facility] [address] [place], [place], [address] Phone: [phone]   Fax:  [phone]  Physical Therapy Treatment/Recertification  Patient Details  Name: [name] MRN: [id number] Date of Birth: [birth year] Referring Provider: Dr. Suella Broad  Encounter Date: 12/26/2015      PT End of Session - 12/26/15 1714    Visit Number 14   Number of Visits 18   Date for PT Re-Evaluation 01/23/16   Authorization Type workers comp 18 visits approved   PT Start Time 1230   PT Stop Time 1320   PT Time Calculation (min) 50 min   Activity Tolerance Patient tolerated treatment well      Past Medical History  Diagnosis Date  . Irregular heart beat     history - no treatment  Dr Wynonia Lawman  . Hyperthyroidism     no meds  . GERD (gastroesophageal reflux disease)   . Headache(784.0)     otc meds prn  . PONV (postoperative nausea and vomiting)   . Sinus tachycardia ([place])   . Urinary incontinence     urgency and night urination  . Right ovarian cyst 2016    CT scan - 1 cm, possible dermoid.    Past Surgical History  Procedure Laterality Date  . Anterior and posterior repair    . Incontinence surgery    . Nose surgery    . Shoulder surgery      bilateral left x 2, right x 1 surgery  . Upper gastrointestinal endoscopy    . Svd       x 1  . Dilation and curettage of uterus    . Diagnostic laparoscopy    . Wisdom tooth extraction    . Laparoscopy  06/12/2011    Procedure: LAPAROSCOPY OPERATIVE;  Surgeon: Arloa Koh;  Location: [facility];  Service: Gynecology;  Laterality: N/A;   Left Ovarian Cystectomy  . Endometrial ablation  05/2011    There were no vitals filed for this visit.      Subjective Assessment - 12/26/15 1233    Subjective I've been working some nights.   I have a headache right face and ear, I think it is sinus infection.     Currently in Pain? Yes   Pain Score 8    Pain Location Neck   Pain  Orientation Right   Pain Frequency Intermittent            [facility] PT Assessment - 12/26/15 0001    AROM   Cervical Flexion 40   Cervical Extension 15   Cervical - Right Side Bend 40   Cervical - Left Side Bend 20   Cervical - Right Rotation 40   Cervical - Left Rotation 45                     [facility] Adult PT Treatment/Exercise - 12/26/15 0001    Moist Heat Therapy   Number Minutes Moist Heat 10 Minutes   Moist Heat Location Cervical   Electrical Stimulation   Electrical Stimulation Location cervical   Electrical Stimulation Action Hi-volt   Electrical Stimulation Parameters 150 V   Electrical Stimulation Goals Pain   Manual Therapy   Manual Therapy Myofascial release   Soft tissue mobilization Right upper traps, levators; suboccipitals   Myofascial Release suboccipital release 3 min   Manual Traction 3x 30 sec   Muscle Energy Technique right upper trap contract/relax  Trigger Point Dry Needling - 12/26/15 1713    Consent Given? Yes   Muscles Treated Upper Body --  right cervical multifidi   Upper Trapezius Response Twitch reponse elicited;Palpable increased muscle length   SubOccipitals Response Palpable increased muscle length   Levator Scapulae Response Twitch response elicited;Palpable increased muscle length                PT Short Term Goals - 12/26/15 1718    PT SHORT TERM GOAL #1   Title independent of intial HEP   Status Achieved   PT SHORT TERM GOAL #2   Title cervcial and right UE pain has decreased >/= 25% with daily activities   Status Achieved   PT SHORT TERM GOAL #3   Title ability to use her right UE more due to increased strength   Status Achieved           PT Long Term Goals - 12/26/15 1718    PT LONG TERM GOAL #1   Title Independent with HEP and unstands how to progress herself   Status Achieved   PT LONG TERM GOAL #2   Title cervical and right UE pain decreased >/= 75% so she is able to use her right  arm more   Time 8   Period Weeks   Status On-going   PT LONG TERM GOAL #3   Title FOTO score </= 47% limitation   Time 8   Period Weeks   Status On-going   PT LONG TERM GOAL #4   Title numbness and burning pain in right lower arm reduced >/= 50%   Status Achieved   PT LONG TERM GOAL #5   Title sleep with >/= 75% greater ease due to reduce pain   Time 8   Period Weeks   Status On-going               Plan - 12/26/15 1715    Clinical Impression Statement Stiffness persists.  She has posterior region headache which she attributes to possible sinus issue since she feels pressure right side of face to ear as well.  Good pain relief with deep pressure to right C3 transverse process.     Rehab Potential Excellent   PT Frequency 2x / week   PT Duration 4 weeks   PT Treatment/Interventions Cryotherapy;Electrical Stimulation;Iontophoresis 4mg /ml Dexamethasone;Moist Heat;Traction;Ultrasound;Contrast Bath;Therapeutic exercise;Therapeutic activities;Functional mobility training;Neuromuscular re-education;Patient/family education;Manual techniques;Passive range of motion;Dry needling   PT Next Visit Plan manual therapy;  dry needling with or without e-stim;  progress to upper quarter strengthening over 4 more approved visits      Patient will benefit from skilled therapeutic intervention in order to improve the following deficits and impairments:  Increased fascial restricitons, Pain, Decreased mobility, Increased muscle spasms, Postural dysfunction, Decreased activity tolerance, Decreased endurance, Decreased range of motion, Decreased strength  Visit Diagnosis: Cervicalgia - Plan: PT plan of care cert/re-cert  Muscle weakness (generalized) - Plan: PT plan of care cert/re-cert     Problem List There are no active problems to display for this patient.  Ruben Im, PT 12/26/2015 5:23 PM Phone: [phone] Fax: [phone]  [name] 12/26/2015, Lajuan Lines PM  [facility] [address] [place], [place], [address] Phone: [phone]   Fax:  [phone]  Name: [name] MRN: [id number] Date of Birth: 1970/10/31

## 2016-01-02 ENCOUNTER — Ambulatory Visit: Payer: PRIVATE HEALTH INSURANCE | Admitting: Physical Therapy

## 2016-01-02 ENCOUNTER — Encounter: Payer: Self-pay | Admitting: Physical Therapy

## 2016-01-02 DIAGNOSIS — M542 Cervicalgia: Secondary | ICD-10-CM | POA: Diagnosis not present

## 2016-01-02 DIAGNOSIS — M6281 Muscle weakness (generalized): Secondary | ICD-10-CM

## 2016-01-02 NOTE — Therapy (Signed)
[facility] [address], [place], [place], [address] Phone: [phone]   Fax:  [phone]  Physical Therapy Treatment  Patient Details  Name: [name] MRN: [id number] Date of Birth: 1971/05/13 Referring Provider: Dr. Suella Broad  Encounter Date: 01/02/2016      PT End of Session - 01/02/16 1045    Visit Number 15   Number of Visits 18   Date for PT Re-Evaluation 01/23/16   Authorization Type workers comp 18 visits approved   Authorization - Visit Number 5   Authorization - Number of Visits 10   PT Start Time 1020   PT Stop Time 1115   PT Time Calculation (min) 55 min   Activity Tolerance Patient tolerated treatment well   Behavior During Therapy [facility] for tasks assessed/performed      Past Medical History  Diagnosis Date  . Irregular heart beat     history - no treatment  Dr Wynonia Lawman  . Hyperthyroidism     no meds  . GERD (gastroesophageal reflux disease)   . Headache(784.0)     otc meds prn  . PONV (postoperative nausea and vomiting)   . Sinus tachycardia ([place])   . Urinary incontinence     urgency and night urination  . Right ovarian cyst 2016    CT scan - 1 cm, possible dermoid.    Past Surgical History  Procedure Laterality Date  . Anterior and posterior repair    . Incontinence surgery    . Nose surgery    . Shoulder surgery      bilateral left x 2, right x 1 surgery  . Upper gastrointestinal endoscopy    . Svd       x 1  . Dilation and curettage of uterus    . Diagnostic laparoscopy    . Wisdom tooth extraction    . Laparoscopy  06/12/2011    Procedure: LAPAROSCOPY OPERATIVE;  Surgeon: Arloa Koh;  Location: [facility];  Service: Gynecology;  Laterality: N/A;   Left Ovarian Cystectomy  . Endometrial ablation  05/2011    There were no vitals filed for this visit.      Subjective Assessment - 01/02/16 1038    Subjective Pt reports all over feeling better. The Rt scapular area is  still very tight and tender   Limitations Lifting;Reading;House hold activities   Diagnostic tests MRI showed C4-C5 with bulging disc   Currently in Pain? Yes   Pain Score 5    Pain Location Neck   Pain Orientation Right   Pain Descriptors / Indicators Numbness   Pain Type Chronic pain   Pain Onset More than a month ago   Pain Frequency Intermittent   Aggravating Factors  lifting, laying down at night   Pain Relieving Factors pressure on the right, not lifting   Effect of Pain on Daily Activities daily activities   Multiple Pain Sites No                         [facility] Adult PT Treatment/Exercise - 01/02/16 0001    Neck Exercises: Supine   Capital Flexion 20 reps;3 secs  with small towel   Shoulder Exercises: Prone   Other Prone Exercises prone & supine over green physio ball 4 x 5 each needs vc's for technique   Other Prone Exercises Plank x 3 20sec hold   Moist Heat Therapy   Number Minutes Moist Heat 10 Minutes  Moist Heat Location Cervical   Haematologist Parameters 150V   Electrical Stimulation Goals Pain   Manual Therapy   Manual Therapy Myofascial release   Soft tissue mobilization Right upper traps, levators; suboccipitals  in Left sidelying with contract/relax for UT   Myofascial Release --   Muscle Energy Technique right upper trap contract/relax                  PT Short Term Goals - 12/26/15 1718    PT SHORT TERM GOAL #1   Title independent of intial HEP   Status Achieved   PT SHORT TERM GOAL #2   Title cervcial and right UE pain has decreased >/= 25% with daily activities   Status Achieved   PT SHORT TERM GOAL #3   Title ability to use her right UE more due to increased strength   Status Achieved           PT Long Term Goals - 12/26/15 1718    PT LONG TERM GOAL #1   Title Independent with HEP and unstands  how to progress herself   Status Achieved   PT LONG TERM GOAL #2   Title cervical and right UE pain decreased >/= 75% so she is able to use her right arm more   Time 8   Period Weeks   Status On-going   PT LONG TERM GOAL #3   Title FOTO score </= 47% limitation   Time 8   Period Weeks   Status On-going   PT LONG TERM GOAL #4   Title numbness and burning pain in right lower arm reduced >/= 50%   Status Achieved   PT LONG TERM GOAL #5   Title sleep with >/= 75% greater ease due to reduce pain   Time 8   Period Weeks   Status On-going             Patient will benefit from skilled therapeutic intervention in order to improve the following deficits and impairments:     Visit Diagnosis: Cervicalgia  Muscle weakness (generalized)     Problem List There are no active problems to display for this patient.   NAUMANN-HOUEGNIFIO,ELKE PTA 01/02/2016, 12:13 PM  [facility] [address], [place], [place], [address] Phone: [phone]   Fax:  [phone]  Name: [name] MRN: [id number] Date of Birth: 07/29/1970

## 2016-01-07 ENCOUNTER — Ambulatory Visit: Payer: PRIVATE HEALTH INSURANCE | Admitting: Physical Therapy

## 2016-01-07 DIAGNOSIS — M542 Cervicalgia: Secondary | ICD-10-CM | POA: Diagnosis not present

## 2016-01-07 DIAGNOSIS — M6281 Muscle weakness (generalized): Secondary | ICD-10-CM

## 2016-01-07 MED FILL — FLUTICASONE PROP 50 MCG SPR: 50 | 30 days supply | Qty: 16 | Fill #3

## 2016-01-07 MED FILL — VYVANSE 20 MG CAPSULE: 20 | 30 days supply | Qty: 30 | Fill #0

## 2016-01-07 NOTE — Therapy (Signed)
[facility] [address], [place], [place], [address] Phone: [phone]   Fax:  [phone]  Physical Therapy Treatment  Patient Details  Name: [name] MRN: [id number] Date of Birth: 1971/03/21 Referring Provider: Dr. Suella Broad  Encounter Date: 01/07/2016      PT End of Session - 01/07/16 1137    Visit Number 16   Number of Visits 18   Date for PT Re-Evaluation 01/23/16   Authorization Type workers comp 18 visits approved   PT Start Time 1102   PT Stop Time 1140   PT Time Calculation (min) 38 min   Activity Tolerance Patient tolerated treatment well      Past Medical History  Diagnosis Date  . Irregular heart beat     history - no treatment  Dr Wynonia Lawman  . Hyperthyroidism     no meds  . GERD (gastroesophageal reflux disease)   . Headache(784.0)     otc meds prn  . PONV (postoperative nausea and vomiting)   . Sinus tachycardia ([place])   . Urinary incontinence     urgency and night urination  . Right ovarian cyst 2016    CT scan - 1 cm, possible dermoid.    Past Surgical History  Procedure Laterality Date  . Anterior and posterior repair    . Incontinence surgery    . Nose surgery    . Shoulder surgery      bilateral left x 2, right x 1 surgery  . Upper gastrointestinal endoscopy    . Svd       x 1  . Dilation and curettage of uterus    . Diagnostic laparoscopy    . Wisdom tooth extraction    . Laparoscopy  06/12/2011    Procedure: LAPAROSCOPY OPERATIVE;  Surgeon: Arloa Koh;  Location: [facility];  Service: Gynecology;  Laterality: N/A;   Left Ovarian Cystectomy  . Endometrial ablation  05/2011    There were no vitals filed for this visit.      Subjective Assessment - 01/07/16 1105    Subjective Back on day shift which is better.  4/10 headache.     Currently in Pain? Yes   Pain Score 4    Pain Location Neck   Pain Orientation Right   Pain Type Chronic pain                          [facility] Adult PT Treatment/Exercise - 01/07/16 0001    Moist Heat Therapy   Number Minutes Moist Heat 12 Minutes   Moist Heat Location Cervical   Electrical Stimulation   Electrical Stimulation Location cervical    Electrical Stimulation Action Hi-volt   Electrical Stimulation Parameters 120 V  12 min   Electrical Stimulation Goals Pain   Manual Therapy   Manual Therapy Myofascial release   Joint Mobilization C-4-C2 grade 3 rotation mobs 20 sec each level/ lateral glides right C4-C7 20s grade 3   Soft tissue mobilization Right upper traps, levators; suboccipitals  in Left sidelying with contract/relax for UT   Manual Traction 3x 30 sec          Trigger Point Dry Needling - 01/07/16 1137    Consent Given? Yes   Muscles Treated Upper Body --  bilateral cervical multilfidi   Upper Trapezius Response Twitch reponse elicited;Palpable increased muscle length   SubOccipitals Response Palpable increased muscle length   Levator Scapulae Response Twitch response  elicited;Palpable increased muscle length                PT Short Term Goals - 01/07/16 1626    PT SHORT TERM GOAL #1   Title independent of intial HEP   Status Achieved   PT SHORT TERM GOAL #2   Title cervcial and right UE pain has decreased >/= 25% with daily activities   Status Achieved   PT SHORT TERM GOAL #3   Title ability to use her right UE more due to increased strength   Status Achieved           PT Long Term Goals - 01/07/16 1626    PT LONG TERM GOAL #1   Title Independent with HEP and unstands how to progress herself   Status Achieved   PT LONG TERM GOAL #2   Title cervical and right UE pain decreased >/= 75% so she is able to use her right arm more   Time 8   Period Weeks   Status On-going   PT LONG TERM GOAL #3   Title FOTO score </= 47% limitation   Time 8   Period Weeks   Status On-going   PT LONG TERM GOAL #4   Title numbness and burning pain  in right lower arm reduced >/= 50%   Status Achieved   PT LONG TERM GOAL #5   Title sleep with >/= 75% greater ease due to reduce pain   Time 8   Period Weeks   Status On-going               Plan - 01/07/16 1411    Clinical Impression Statement Patient having a "pretty good" day with pain intensity with improved cervical ROM and soft tissue mobility compared to previous visits in the last few weeks.  Tender points in right suboccipital region and and right C2-3 transverse process.     PT Next Visit Plan postural and upper quarter strengthening;  deep cervical flexor strengthening;  manual therapy;  dry needling 1 more time;  e-stim as needed for pain control;  probable discharge in 2 visits      Patient will benefit from skilled therapeutic intervention in order to improve the following deficits and impairments:     Visit Diagnosis: Cervicalgia  Muscle weakness (generalized)     Problem List There are no active problems to display for this patient.   Ruben Im, PT 01/07/2016 4:28 PM Phone: [phone] Fax: [phone]  [name] 01/07/2016, 4:28 PM  [facility] [address] [place], [place], [address] Phone: [phone]   Fax:  [phone]  Name: [name] MRN: [id number] Date of Birth: 1971-04-29

## 2016-01-13 ENCOUNTER — Ambulatory Visit: Payer: PRIVATE HEALTH INSURANCE | Admitting: Physical Therapy

## 2016-01-13 DIAGNOSIS — M542 Cervicalgia: Secondary | ICD-10-CM

## 2016-01-13 DIAGNOSIS — M6281 Muscle weakness (generalized): Secondary | ICD-10-CM

## 2016-01-13 NOTE — Therapy (Signed)
[facility] [address] [place], [place], [address] Phone: [phone]   Fax:  [phone]  Physical Therapy Treatment  Patient Details  Name: [name] MRN: [id number] Date of Birth: [birth year] Referring Provider: Dr. Suella Broad  Encounter Date: 01/13/2016      PT End of Session - 01/13/16 1244    Visit Number 17   Number of Visits 18   Date for PT Re-Evaluation 01/23/16   Authorization Type workers comp 18 visits approved   Authorization - Visit Number 6   Authorization - Number of Visits 11   PT Start Time 1152   PT Stop Time 1249   PT Time Calculation (min) 57 min   Activity Tolerance Patient tolerated treatment well   Behavior During Therapy [facility] for tasks assessed/performed      Past Medical History  Diagnosis Date  . Irregular heart beat     history - no treatment  Dr Wynonia Lawman  . Hyperthyroidism     no meds  . GERD (gastroesophageal reflux disease)   . Headache(784.0)     otc meds prn  . PONV (postoperative nausea and vomiting)   . Sinus tachycardia ([place])   . Urinary incontinence     urgency and night urination  . Right ovarian cyst 2016    CT scan - 1 cm, possible dermoid.    Past Surgical History  Procedure Laterality Date  . Anterior and posterior repair    . Incontinence surgery    . Nose surgery    . Shoulder surgery      bilateral left x 2, right x 1 surgery  . Upper gastrointestinal endoscopy    . Svd       x 1  . Dilation and curettage of uterus    . Diagnostic laparoscopy    . Wisdom tooth extraction    . Laparoscopy  06/12/2011    Procedure: LAPAROSCOPY OPERATIVE;  Surgeon: Arloa Koh;  Location: [facility];  Service: Gynecology;  Laterality: N/A;   Left Ovarian Cystectomy  . Endometrial ablation  05/2011    There were no vitals filed for this visit.                       [place] Adult PT Treatment/Exercise - 01/13/16 0001    Shoulder Exercises:  Prone   Other Prone Exercises prone & supine over green physio ball 4 x 5 each needs vc's for technique   Other Prone Exercises Plank x 3 20sec hold   Electrical Stimulation   Electrical Stimulation Location cervical    Electrical Stimulation Action Hi-volt   Electrical Stimulation Parameters 120V   Electrical Stimulation Goals Pain   Manual Therapy   Manual Therapy Myofascial release   Soft tissue mobilization Right upper traps, levators; suboccipitals                  PT Short Term Goals - 01/07/16 1626    PT SHORT TERM GOAL #1   Title independent of intial HEP   Status Achieved   PT SHORT TERM GOAL #2   Title cervcial and right UE pain has decreased >/= 25% with daily activities   Status Achieved   PT SHORT TERM GOAL #3   Title ability to use her right UE more due to increased strength   Status Achieved           PT Long Term Goals - 01/13/16 1249  PT LONG TERM GOAL #1   Title Independent with HEP and unstands how to progress herself   Time 8   Period Weeks   Status Achieved   PT LONG TERM GOAL #2   Title cervical and right UE pain decreased >/= 75% so she is able to use her right arm more   Time 8   Period Weeks   Status On-going   PT LONG TERM GOAL #3   Title FOTO score </= 47% limitation   Time 8   Period Weeks   Status On-going   PT LONG TERM GOAL #4   Title numbness and burning pain in right lower arm reduced >/= 50%   Time 8   Period Weeks   Status Achieved   PT LONG TERM GOAL #5   Title sleep with >/= 75% greater ease due to reduce pain   Baseline 50% better   Time 8   Period Weeks   Status On-going               Plan - 01/13/16 1246    Clinical Impression Statement Patient able to tolerate strengthening activities as UBE and plank position and activities on physioball for strength. Pt continues with tendernss right parapspinals c2-3, sucoccipital trigger points.   Rehab Potential Excellent   Clinical Impairments Affecting  Rehab Potential None   PT Frequency 2x / week   PT Duration 4 weeks   PT Treatment/Interventions Cryotherapy;Electrical Stimulation;Iontophoresis 4mg /ml Dexamethasone;Moist Heat;Traction;Ultrasound;Contrast Bath;Therapeutic exercise;Therapeutic activities;Functional mobility training;Neuromuscular re-education;Patient/family education;Manual techniques;Passive range of motion;Dry needling   PT Next Visit Plan postural and upper quarter strengthening;  deep cervical flexor strengthening;  manual therapy;  dry needling 1 more time;  e-stim as needed for pain control;  probable discharge in 2 visits   PT Home Exercise Plan ROM exercises, cervical stretches   Consulted and Agree with Plan of Care Patient      Patient will benefit from skilled therapeutic intervention in order to improve the following deficits and impairments:  Increased fascial restricitons, Pain, Decreased mobility, Increased muscle spasms, Postural dysfunction, Decreased activity tolerance, Decreased endurance, Decreased range of motion, Decreased strength  Visit Diagnosis: Cervicalgia  Muscle weakness (generalized)     Problem List There are no active problems to display for this patient.   NAUMANN-HOUEGNIFIO,ELKE PTA 01/13/2016, 1:00 PM  [facility] [address] [place], [place], [address] Phone: [phone]   Fax:  [phone]  Name: [name] MRN: [id number] Date of Birth: Dec 18, 1970

## 2016-01-16 MED FILL — DICLOFENAC SODIUM 1% GEL: 1 | 30 days supply | Qty: 300 | Fill #0

## 2016-01-16 MED FILL — OXYCODONE/APAP 5-325: 5-325 | 30 days supply | Qty: 30 | Fill #0

## 2016-01-20 DIAGNOSIS — K219 Gastro-esophageal reflux disease without esophagitis: Secondary | ICD-10-CM | POA: Diagnosis not present

## 2016-01-21 ENCOUNTER — Ambulatory Visit: Payer: PRIVATE HEALTH INSURANCE | Admitting: Physical Therapy

## 2016-01-21 DIAGNOSIS — M5412 Radiculopathy, cervical region: Secondary | ICD-10-CM

## 2016-01-21 DIAGNOSIS — M6281 Muscle weakness (generalized): Secondary | ICD-10-CM

## 2016-01-21 DIAGNOSIS — M542 Cervicalgia: Secondary | ICD-10-CM | POA: Diagnosis not present

## 2016-01-21 NOTE — Therapy (Signed)
[facility] [address] [place], [place], [address] Phone: [phone]   Fax:  [phone]  Physical Therapy Treatment/Discharge Summary  Patient Details  Name: [name] MRN: [id number] Date of Birth: [birth year] Referring Provider: Dr. Suella Broad  Encounter Date: 01/21/2016      PT End of Session - 01/21/16 1141    Visit Number 18   Number of Visits 18   Date for PT Re-Evaluation 01/23/16   Authorization Type workers comp 18 visits approved   PT Start Time 1109   PT Stop Time 1152   PT Time Calculation (min) 43 min   Activity Tolerance Patient tolerated treatment well      Past Medical History  Diagnosis Date  . Irregular heart beat     history - no treatment  Dr Wynonia Lawman  . Hyperthyroidism     no meds  . GERD (gastroesophageal reflux disease)   . Headache(784.0)     otc meds prn  . PONV (postoperative nausea and vomiting)   . Sinus tachycardia ([place])   . Urinary incontinence     urgency and night urination  . Right ovarian cyst 2016    CT scan - 1 cm, possible dermoid.    Past Surgical History  Procedure Laterality Date  . Anterior and posterior repair    . Incontinence surgery    . Nose surgery    . Shoulder surgery      bilateral left x 2, right x 1 surgery  . Upper gastrointestinal endoscopy    . Svd       x 1  . Dilation and curettage of uterus    . Diagnostic laparoscopy    . Wisdom tooth extraction    . Laparoscopy  06/12/2011    Procedure: LAPAROSCOPY OPERATIVE;  Surgeon: Arloa Koh;  Location: [facility];  Service: Gynecology;  Laterality: N/A;   Left Ovarian Cystectomy  . Endometrial ablation  05/2011    There were no vitals filed for this visit.      Subjective Assessment - 01/21/16 1109    Subjective Doing a little better this week.  Was on vacation last week.  Decreased headaches.   I was sore after last dry needling but I had improved ROM.   Currently in Pain? Yes   Pain Score 2    Pain Location Neck   Pain Orientation Right   Pain Type Chronic pain            [facility] PT Assessment - 01/21/16 0001    Observation/Other Assessments   Focus on Therapeutic Outcomes (FOTO)  43% limitation    AROM   Cervical Flexion 34   Cervical Extension 30   Cervical - Right Side Bend 36   Cervical - Left Side Bend 20   Cervical - Right Rotation 42   Cervical - Left Rotation 50   Strength   Overall Strength --  periscapular muscles grossly 5/5   Right Shoulder Internal Rotation 5/5   Right Shoulder External Rotation 5/5   Right Elbow Flexion 5/5   Left Hand Gross Grasp --  60# right; left 58#                     [facility] Adult PT Treatment/Exercise - 01/21/16 0001    Moist Heat Therapy   Number Minutes Moist Heat 10 Minutes   Moist Heat Location Cervical   Manual Therapy   Soft tissue mobilization Right upper traps, levators;  suboccipitals          Trigger Point Dry Needling - 01/21/16 1150    Consent Given? Yes   Muscles Treated Upper Body --  bilateral cervical multifidi   Upper Trapezius Response Twitch reponse elicited;Palpable increased muscle length   SubOccipitals Response Palpable increased muscle length   Levator Scapulae Response Twitch response elicited;Palpable increased muscle length                PT Short Term Goals - 01/21/16 1124    PT SHORT TERM GOAL #1   Title independent of intial HEP   Status Achieved   PT SHORT TERM GOAL #2   Title cervcial and right UE pain has decreased >/= 25% with daily activities   Status Achieved   PT SHORT TERM GOAL #3   Title ability to use her right UE more due to increased strength   Status Achieved           PT Long Term Goals - 01/21/16 1123    PT LONG TERM GOAL #1   Title Independent with HEP and unstands how to progress herself   Status Achieved   PT LONG TERM GOAL #2   Title cervical and right UE pain decreased >/= 75% so she is able to use her right arm  more   Status Achieved   PT LONG TERM GOAL #3   Title FOTO score </= 47% limitation   Status Achieved   PT LONG TERM GOAL #4   Title numbness and burning pain in right lower arm reduced >/= 50%   Status Achieved   PT LONG TERM GOAL #5   Title sleep with >/= 75% greater ease due to reduce pain   Baseline 60% better overall   Status Partially Met               Plan - 01/21/16 1527    Clinical Impression Statement The patient reports an improvement in cervical AROM following dry needling.  She reports she is doing well this week with neck pain and headache since she has not had to do heavy lifting at work.  Improvements in AROM and FOTO functional outcome score from initial but most remarkable improvement is in UE strength with shoulder and scapular muscles grossly 5/5 and grip strength 60# on right (58# on left).  She has met the majority of goals and will be discharged from PT at this time.  She continues to have intermittent myofascial pain and would benefit from occasional massage therapy for maintenance.  She is independent in HEP for flexibility, strengthening and ROM.       PHYSICAL THERAPY DISCHARGE SUMMARY  Visits from Start of Care: 18  Current functional level related to goals / functional outcomes: See clinical impressions above.  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     Education / Equipment: Comprehensive and progressive HEP Plan: Patient agrees to discharge.  Patient goals were met. Patient is being discharged due to meeting the stated rehab goals.  ?????       Patient will benefit from skilled therapeutic intervention in order to improve the following deficits and impairments:     Visit Diagnosis: Cervicalgia  Muscle weakness (generalized)  Radiculopathy, cervical region     Problem List There are no  active problems to display for this patient.   Ruben Im, PT 01/21/2016 5:04 PM  Phone: [phone] Fax: [phone]  [name] 01/21/2016, 5:01 PM  [facility] [address], [place], [place], [address] Phone: [phone]   Fax:  [phone]  Name: [name] MRN: [id number] Date of Birth: [birth year]

## 2016-01-31 MED FILL — OMEPRAZOLE DR 20 MG CAPSULE: 20 | 30 days supply | Qty: 30 | Fill #1

## 2016-01-31 MED FILL — FLUTICASONE PROP 50 MCG SPR: 50 | 30 days supply | Qty: 16 | Fill #0

## 2016-02-04 MED FILL — VYVANSE 20 MG CAPSULE: 20 | 30 days supply | Qty: 30 | Fill #0

## 2016-02-13 MED FILL — DICLOFENAC SODIUM 1% GEL: 1 | 37 days supply | Qty: 300 | Fill #0

## 2016-02-13 MED FILL — OXYCODONE/APAP 5-325: 5-325 | 30 days supply | Qty: 30 | Fill #0

## 2016-03-05 MED FILL — OMEPRAZOLE DR 20 MG CAPSULE: 20 | 90 days supply | Qty: 90 | Fill #0

## 2016-03-06 MED FILL — VYVANSE 20 MG CAPSULE: 20 | 30 days supply | Qty: 30 | Fill #0

## 2016-03-09 MED FILL — FLUTICASONE PROP 50 MCG SPR: 50 | 30 days supply | Qty: 16 | Fill #1

## 2016-03-17 MED FILL — OXYCODONE/APAP 5-325: 5-325 | 30 days supply | Qty: 30 | Fill #0

## 2016-03-31 ENCOUNTER — Ambulatory Visit: Payer: Self-pay

## 2016-04-03 ENCOUNTER — Other Ambulatory Visit: Payer: Self-pay | Admitting: Obstetrics and Gynecology

## 2016-04-03 DIAGNOSIS — Z1231 Encounter for screening mammogram for malignant neoplasm of breast: Secondary | ICD-10-CM

## 2016-04-06 ENCOUNTER — Ambulatory Visit
Admission: RE | Admit: 2016-04-06 | Discharge: 2016-04-06 | Disposition: A | Discharge status: Home / Self Care | Payer: 59 | Source: Ambulatory Visit | Attending: Obstetrics and Gynecology | Admitting: Obstetrics and Gynecology

## 2016-04-06 DIAGNOSIS — Z1231 Encounter for screening mammogram for malignant neoplasm of breast: Secondary | ICD-10-CM

## 2016-04-07 HISTORY — PX: BREAST SURGERY: SHX581

## 2016-04-13 ENCOUNTER — Other Ambulatory Visit: Payer: Self-pay | Admitting: Obstetrics and Gynecology

## 2016-04-13 ENCOUNTER — Encounter: Payer: Self-pay | Admitting: Obstetrics and Gynecology

## 2016-04-13 DIAGNOSIS — R928 Other abnormal and inconclusive findings on diagnostic imaging of breast: Secondary | ICD-10-CM

## 2016-04-16 ENCOUNTER — Other Ambulatory Visit: Payer: Self-pay | Admitting: Obstetrics and Gynecology

## 2016-04-16 ENCOUNTER — Ambulatory Visit
Admission: RE | Admit: 2016-04-16 | Discharge: 2016-04-16 | Disposition: A | Discharge status: Home / Self Care | Payer: 59 | Source: Ambulatory Visit | Attending: Obstetrics and Gynecology | Admitting: Obstetrics and Gynecology

## 2016-04-16 DIAGNOSIS — N6320 Unspecified lump in the left breast, unspecified quadrant: Secondary | ICD-10-CM

## 2016-04-16 DIAGNOSIS — R928 Other abnormal and inconclusive findings on diagnostic imaging of breast: Secondary | ICD-10-CM

## 2016-04-16 DIAGNOSIS — N63 Unspecified lump in breast: Secondary | ICD-10-CM | POA: Diagnosis not present

## 2016-04-16 MED FILL — DICLOFENAC SODIUM 1% GEL: 1 | 30 days supply | Qty: 300 | Fill #0

## 2016-04-16 MED FILL — OXYCODONE/APAP 5-325: 5-325 | 30 days supply | Qty: 30 | Fill #0

## 2016-04-20 ENCOUNTER — Telehealth: Payer: Self-pay | Admitting: Obstetrics and Gynecology

## 2016-04-20 ENCOUNTER — Other Ambulatory Visit: Payer: Self-pay | Admitting: Obstetrics and Gynecology

## 2016-04-20 DIAGNOSIS — N6320 Unspecified lump in the left breast, unspecified quadrant: Secondary | ICD-10-CM

## 2016-04-20 NOTE — Telephone Encounter (Signed)
Spoke with patient. Advised of message as seen below from [place]. Patient is agreeable and verbalizes understanding. Patient will keep her appointment as scheduled for ultrasound guided [facility] tomorrow at 7:30 am.  Routing to provider for final review. Patient agreeable to disposition. Will close encounter.

## 2016-04-20 NOTE — Telephone Encounter (Signed)
Spoke with patient. Patient states that she is scheduled to have a ultrasound guided biopsy of her left breast with [facility] tomorrow at 7:30 am. Patient reports she does not feel the [facility] has discussed her results with her in depth. Does not wish to proceed with the biopsy if she is going to need removal of the mass. States "I would rather go ahead and have the area removed instead of having to have to procedures." Advised patient of importance of having a biopsy for diagnostic purposes and to determine if removal is needed. Patient would like to discuss this with Dr.Silva personally. Advised Dr.Silva is currently seeing patients and I cannot guarantee return call from physician before time to cancel her appointment for tomorrow if she desires to do so. Advised again this is not recommended. Advised I will speak with Dr.Silva and return call with further recommendations. Patient is agreeable.

## 2016-04-20 NOTE — Telephone Encounter (Signed)
I want to get back to the patient before it is too late in the afternoon.  The biopsy is very helpful to detect any potential abnormal cells present.  The lumpectomy is not recommended until the final pathology is available, as this could change her final care plan and recommendations.

## 2016-04-20 NOTE — Telephone Encounter (Signed)
Patient called requesting to speak with the nurse about about a procedure she is having at [facility] tomorrow. She may decide to cancel it but wants to speak with the nurse first.

## 2016-04-21 ENCOUNTER — Ambulatory Visit
Admission: RE | Admit: 2016-04-21 | Discharge: 2016-04-21 | Disposition: A | Discharge status: Home / Self Care | Payer: 59 | Source: Ambulatory Visit | Attending: Obstetrics and Gynecology | Admitting: Obstetrics and Gynecology

## 2016-04-21 DIAGNOSIS — N6320 Unspecified lump in the left breast, unspecified quadrant: Secondary | ICD-10-CM

## 2016-04-21 DIAGNOSIS — D242 Benign neoplasm of left breast: Secondary | ICD-10-CM | POA: Diagnosis not present

## 2016-04-21 DIAGNOSIS — N63 Unspecified lump in breast: Secondary | ICD-10-CM | POA: Diagnosis not present

## 2016-04-22 DIAGNOSIS — F902 Attention-deficit hyperactivity disorder, combined type: Secondary | ICD-10-CM | POA: Diagnosis not present

## 2016-04-22 DIAGNOSIS — R5382 Chronic fatigue, unspecified: Secondary | ICD-10-CM | POA: Diagnosis not present

## 2016-04-22 DIAGNOSIS — Z13220 Encounter for screening for lipoid disorders: Secondary | ICD-10-CM | POA: Diagnosis not present

## 2016-04-22 DIAGNOSIS — J309 Allergic rhinitis, unspecified: Secondary | ICD-10-CM | POA: Diagnosis not present

## 2016-04-22 DIAGNOSIS — Z0000 Encounter for general adult medical examination without abnormal findings: Secondary | ICD-10-CM | POA: Diagnosis not present

## 2016-04-23 MED FILL — FLUTICASONE PROP 50 MCG SPR: 50 | 30 days supply | Qty: 16 | Fill #0

## 2016-04-24 MED FILL — VYVANSE 20 MG CAPSULE: 20 | 30 days supply | Qty: 30 | Fill #0

## 2016-04-30 NOTE — Progress Notes (Signed)
45 y.o. G80P1001 Single Caucasian female here for annual exam.    Menses are spotting only.  Menses every 2 - 6 weeks.  The every 2 week cycle is every other time she has a menses. Hx ablation.  Bleeding is painful. Took LoLoEstrin last year and had nausea.  Double voiding.  Voiding frequency changes.  Has urgency with water running.  Used medication in past and had urinary retention.  Did not try Myrbetriq.   Had an anterior and posterior colporrhaphy with grafting - 2008 or 2009.  Thinks she did a sling but not sure.   [place] last year - 5 mm cervical mass consistent with polyp, otherwise unremarkable Had a CT of abdomen and pelvis showing a 1 cm area of the right ovary consistent with possible dermoid.   Some hot flashes and night sweats.   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  PCP:   Carol Ada, MD  Patient's last menstrual period was 04/29/2016 (approximate).     Period Pattern: (!) Irregular Menstrual Flow: Light Menstrual Control: Panty liner Dysmenorrhea: (!) Severe Dysmenorrhea Symptoms: Cramping     Sexually active: Yes.   female The current method of family planning is none/Novasure Ablation.    Exercising: Yes.    Cardio and weights Smoker:  no  Health Maintenance: Pap:  04-24-15 Neg:Neg HR HPV History of abnormal Pap:  no MMG:  04-07-16 Density C/Left Br.possible mass, Rt.Br.neg;04-16-16 Lt.Br.US reveals indeterminate mass with slight suspicion over 9:30 position of Lt.Br.and rec.biopsy.04-22-16 US guided core Bx Lt.Br.with pathology revealed Fibroadenoma--return to annual screening.   Colonoscopy:  n/a BMD:   n/a  Result  n/a TDaP:  Up to date through [facility] Gardas[facility]:   N/A HIV: in pregnancy. Hep C:  NA Screening Labs:  Hb today: PCP, Urine today:  neg   reports that she has never smoked. She has never used smokeless tobacco. She reports that she drinks alcohol. She reports that she does not use  drugs.  Past Medical History:  Diagnosis Date  . GERD (gastroesophageal reflux disease)   . Headache(784.0)    otc meds prn  . Hyperthyroidism    no meds  . Irregular heart beat    history - no treatment  Dr Wynonia Lawman  . PONV (postoperative nausea and vomiting)   . Right ovarian cyst 2016   CT scan - 1 cm, possible dermoid.  . Sinus tachycardia   . Urinary incontinence    urgency and night urination    Past Surgical History:  Procedure Laterality Date  . ANTERIOR AND POSTERIOR REPAIR    . BREAST SURGERY  04/07/2016   Lt.Br.Bx--Fibroadenoma  . DIAGNOSTIC LAPAROSCOPY    . DILATION AND CURETTAGE OF UTERUS    . ENDOMETRIAL ABLATION  05/2011  . INCONTINENCE SURGERY    . LAPAROSCOPY  06/12/2011   Procedure: LAPAROSCOPY OPERATIVE;  Surgeon: Arloa Koh;  Location: [facility];  Service: Gynecology;  Laterality: N/A;   Left Ovarian Cystectomy  . NOSE SURGERY    . SHOULDER SURGERY     bilateral left x 2, right x 1 surgery  . svd      x 1  . UPPER GASTROINTESTINAL ENDOSCOPY    . WISDOM TOOTH EXTRACTION      Current Outpatient Prescriptions  Medication Sig Dispense Refill  . Cyanocobalamin (VITAMIN B12 PO) Take 1 each by mouth as needed. gummie     . lisdexamfetamine (VYVANSE) 20 MG capsule Take  20 mg by mouth as needed.    . Multiple Vitamins-Minerals (MULTIVITAMIN WITH MINERALS) tablet Take 1 tablet by mouth daily.      Marland Kitchen omeprazole-sodium bicarbonate (ZEGERID) 40-1100 MG per capsule Take 1 capsule by mouth daily before breakfast.      . oxyCODONE-acetaminophen (PERCOCET/ROXICET) 5-325 MG tablet Take 1 tablet by mouth every 4 (four) hours. Prn pain  0   No current facility-administered medications for this visit.     Family History  Problem Relation Age of Onset  . Fibromyalgia Mother   . Thyroid disease Mother   . Seizures Father     d/t head injury  . Cancer Father     ?stomach ca  . Cancer Paternal Grandmother     colon  . Hypertension Paternal Grandmother   . Stroke  Paternal Grandmother   . Diabetes Paternal Aunt   . Hypertension Maternal Grandmother   . Thyroid disease Maternal Grandmother     ROS:  Pertinent items are noted in HPI.  Otherwise, a comprehensive ROS was negative.  Exam:   BP 110/60 (BP Location: Right Arm, Patient Position: Sitting, Cuff Size: Normal)   Pulse 80   Resp 16   Ht 5' 2.5" (1.588 m)   Wt 125 lb (56.7 kg)   LMP 04/29/2016 (Approximate)   BMI 22.50 kg/m     General appearance: alert, cooperative and appears stated age Head: Normocephalic, without obvious abnormality, atraumatic Neck: no adenopathy, supple, symmetrical, trachea midline and thyroid normal to inspection and palpation Lungs: clear to auscultation bilaterally Breasts: normal appearance, no masses or tenderness, No nipple retraction or dimpling, No nipple discharge or bleeding, No axillary or supraclavicular adenopathy Heart: regular rate and rhythm Abdomen: soft, non-tender; no masses, no organomegaly Extremities: extremities normal, atraumatic, no cyanosis or edema Skin: Skin color, texture, turgor normal. No rashes or lesions Lymph nodes: Cervical, supraclavicular, and axillary nodes normal. No abnormal inguinal nodes palpated Neurologic: Grossly normal  Pelvic: External genitalia:  no lesions              Urethra:  normal appearing urethra with no masses, tenderness or lesions              Bartholins and Skenes: normal                 Vagina: normal appearing vagina with normal color and discharge, no lesions              Cervix: no lesions              Pap taken: No. Bimanual Exam:  Uterus:  normal size, contour, position, consistency, mobility, non-tender              Adnexa: no mass, fullness, tenderness              Rectal exam: Yes.  .  Confirms.              Anus:  normal sphincter tone, no lesions  Chaperone was present for exam.  Assessment:   Well woman visit with normal exam. Overactive bladder.  Status post A and P repair and  midurethral sling? Irregular menses.  Dysmenorrhea.  Possible right ovarian dermoid. Hx fibrocystic breasts.   Plan: Yearly mammogram recommended after age 14.  Recommended self breast exam.  Pap and HR HPV as above. Discussed Calcium, Vitamin D, regular exercise program including cardiovascular and weight bearing exercise. Return for pelvic US. She will bring copy of OP report for her pelvic organ  reconstruction.  Myrbetriq 25 mg daily.  I discussed side effects including urinary retention.   Follow up annually and prn.      After visit summary provided.

## 2016-05-13 ENCOUNTER — Encounter: Payer: Self-pay | Admitting: Obstetrics and Gynecology

## 2016-05-13 ENCOUNTER — Ambulatory Visit (INDEPENDENT_AMBULATORY_CARE_PROVIDER_SITE_OTHER): Payer: 59 | Admitting: Obstetrics and Gynecology

## 2016-05-13 VITALS — BP 110/60 | HR 80 | Resp 16 | Ht 62.5 in | Wt 125.0 lb

## 2016-05-13 DIAGNOSIS — Z01419 Encounter for gynecological examination (general) (routine) without abnormal findings: Secondary | ICD-10-CM | POA: Diagnosis not present

## 2016-05-13 DIAGNOSIS — N926 Irregular menstruation, unspecified: Secondary | ICD-10-CM

## 2016-05-13 DIAGNOSIS — Z0000 Encounter for general adult medical examination without abnormal findings: Secondary | ICD-10-CM | POA: Diagnosis not present

## 2016-05-13 DIAGNOSIS — N3281 Overactive bladder: Secondary | ICD-10-CM | POA: Diagnosis not present

## 2016-05-13 DIAGNOSIS — N946 Dysmenorrhea, unspecified: Secondary | ICD-10-CM

## 2016-05-13 LAB — POCT URINALYSIS DIPSTICK
Bilirubin, UA: NEGATIVE
Blood, UA: NEGATIVE
Glucose, UA: NEGATIVE
Ketones, UA: NEGATIVE
Leukocytes, UA: NEGATIVE
Nitrite, UA: NEGATIVE
Protein, UA: NEGATIVE
Urobilinogen, UA: NEGATIVE
pH, UA: 5

## 2016-05-13 MED ORDER — MIRABEGRON ER 25 MG PO TB24: 25.0000 mg | ORAL_TABLET | Freq: Every day | ORAL | 2 refills | Status: DC

## 2016-05-13 MED FILL — MYRBETRIQ ER 25 MG TABLET: 25 | 30 days supply | Qty: 30 | Fill #0

## 2016-05-13 NOTE — Patient Instructions (Signed)
EXERCISE AND DIET:  We recommended that you start or continue a regular exercise program for good health. Regular exercise means any activity that makes your heart beat faster and makes you sweat.  We recommend exercising at least 30 minutes per day at least 3 days a week, preferably 4 or 5.  We also recommend a diet low in fat and sugar.  Inactivity, poor dietary choices and obesity can cause diabetes, heart attack, stroke, and kidney damage, among others.    ALCOHOL AND SMOKING:  Women should limit their alcohol intake to no more than 7 drinks/beers/glasses of wine (combined, not each!) per week. Moderation of alcohol intake to this level decreases your risk of breast cancer and liver damage. And of course, no recreational drugs are part of a healthy lifestyle.  And absolutely no smoking or even second hand smoke. Most people know smoking can cause heart and lung diseases, but did you know it also contributes to weakening of your bones? Aging of your skin?  Yellowing of your teeth and nails?  CALCIUM AND VITAMIN D:  Adequate intake of calcium and Vitamin D are recommended.  The recommendations for exact amounts of these supplements seem to change often, but generally speaking 600 mg of calcium (either carbonate or citrate) and 800 units of Vitamin D per day seems prudent. Certain women may benefit from higher intake of Vitamin D.  If you are among these women, your doctor will have told you during your visit.    PAP SMEARS:  Pap smears, to check for cervical cancer or precancers,  have traditionally been done yearly, although recent scientific advances have shown that most women can have pap smears less often.  However, every woman still should have a physical exam from her gynecologist every year. It will include a breast check, inspection of the vulva and vagina to check for abnormal growths or skin changes, a visual exam of the cervix, and then an exam to evaluate the size and shape of the uterus and  ovaries.  And after 47 years of age, a rectal exam is indicated to check for rectal cancers. We will also provide age appropriate advice regarding health maintenance, like when you should have certain vaccines, screening for sexually transmitted diseases, bone density testing, colonoscopy, mammograms, etc.   MAMMOGRAMS:  All women over 40 years old should have a yearly mammogram. Many facilities now offer a "3D" mammogram, which may cost around $50 extra out of pocket. If possible,  we recommend you accept the option to have the 3D mammogram performed.  It both reduces the number of women who will be called back for extra views which then turn out to be normal, and it is better than the routine mammogram at detecting truly abnormal areas.    COLONOSCOPY:  Colonoscopy to screen for colon cancer is recommended for all women at age 50.  We know, you hate the idea of the prep.  We agree, BUT, having colon cancer and not knowing it is worse!!  Colon cancer so often starts as a polyp that can be seen and removed at colonscopy, which can quite literally save your life!  And if your first colonoscopy is normal and you have no family history of colon cancer, most women don't have to have it again for 10 years.  Once every ten years, you can do something that may end up saving your life, right?  We will be happy to help you get it scheduled when you are ready.    Be sure to check your insurance coverage so you understand how much it will cost.  It may be covered as a preventative service at no cost, but you should check your particular policy.     Mirabegron extended-release tablets What is this medicine? MIRABEGRON (MIR a BEG ron) is used to treat overactive bladder. This medicine reduces the amount of bathroom visits. It may also help to control wetting accidents. This medicine may be used for other purposes; ask your health care provider or pharmacist if you have questions. What should I tell my health care  provider before I take this medicine? They need to know if you have any of these conditions: -difficulty passing urine -high blood pressure -kidney disease -liver disease -an unusual or allergic reaction to mirabegron, other medicines, foods, dyes, or preservatives -pregnant or trying to get pregnant -breast-feeding How should I use this medicine? Take this medicine by mouth with a glass of water. Follow the directions on the prescription label. Do not cut, crush or chew this medicine. You can take it with or without food. If it upsets your stomach, take it with food. Take your medicine at regular intervals. Do not take it more often than directed. Do not stop taking except on your doctor's advice. Talk to your pediatrician regarding the use of this medicine in children. Special care may be needed. Overdosage: If you think you have taken too much of this medicine contact a poison control center or emergency room at once. NOTE: This medicine is only for you. Do not share this medicine with others. What if I miss a dose? If you miss a dose, take it as soon as you can. If it is almost time for your next dose, take only that dose. Do not take double or extra doses. What may interact with this medicine? -certain medicines for bladder problems like fesoterodine, oxybutynin, solifenacin, tolterodine -desipramine -digoxin -flecainide -ketoconazole -MAOIs like Carbex, Eldepryl, Marplan, Nardil, and Parnate -metoprolol -propafenone -thioridazine -warfarin This list may not describe all possible interactions. Give your health care provider a list of all the medicines, herbs, non-prescription drugs, or dietary supplements you use. Also tell them if you smoke, drink alcohol, or use illegal drugs. Some items may interact with your medicine. What should I watch for while using this medicine? It may take 8 weeks to notice the full benefit from this medicine. You may need to limit your intake tea,  coffee, caffeinated sodas, and alcohol. These drinks may make your symptoms worse. Visit your doctor or health care professional for regular checks on your progress. Check your blood pressure as directed. Ask your doctor or health care professional what your blood pressure should be and when you should contact him or her. What side effects may I notice from receiving this medicine? Side effects that you should report to your doctor or health care professional as soon as possible: -allergic reactions like skin rash, itching or hives, swelling of the face, lips, or tongue -chest pain or palpitations -severe or sudden headache -high blood pressure -fast, irregular heartbeat -redness, blistering, peeling or loosening of the skin, including inside the mouth -signs of infection like fever or chills; cough; sore throat; pain or difficulty passing urine -trouble passing urine or change in the amount of urine Side effects that usually do not require medical attention (Report these to your doctor or health care professional if they continue or are bothersome.): -constipation -diarrhea -dizziness -dry eyes -joint pain -mild headache -nausea -runny nose This list  may not describe all possible side effects. Call your doctor for medical advice about side effects. You may report side effects to FDA at 1-800-FDA-1088. Where should I keep my medicine? Keep out of the reach of children. Store at room temperature between 15 and 30 degrees C (59 and 86 degrees F). Throw away any unused medicine after the expiration date. NOTE: This sheet is a summary. It may not cover all possible information. If you have questions about this medicine, talk to your doctor, pharmacist, or health care provider.    2016, Elsevier/Gold Standard. (2015-03-14 10:22:20)

## 2016-05-14 MED FILL — OXYCODONE/APAP 5-325: 5-325 | 30 days supply | Qty: 30 | Fill #0

## 2016-05-14 MED FILL — METHOCARBAMOL 500 MG TABLET: 500 | 10 days supply | Qty: 30 | Fill #0

## 2016-05-14 MED FILL — FLUTICASONE PROP 50 MCG SPR: 50 | 90 days supply | Qty: 48 | Fill #1

## 2016-05-14 MED FILL — MELOXICAM 7.5 MG TABLET: 7.5 | 30 days supply | Qty: 60 | Fill #0

## 2016-05-14 MED FILL — DICLOFENAC SODIUM 1% GEL: 1 | 30 days supply | Qty: 300 | Fill #0

## 2016-05-15 ENCOUNTER — Telehealth: Payer: Self-pay | Admitting: Obstetrics and Gynecology

## 2016-05-15 ENCOUNTER — Encounter: Payer: Self-pay | Admitting: Obstetrics and Gynecology

## 2016-05-15 NOTE — Telephone Encounter (Signed)
Called patient to review benefits for a recommended procedure. Left Voicemail requesting a call back. °

## 2016-05-18 NOTE — Telephone Encounter (Signed)
Called patient to review benefits for a recommended procedure. Left Voicemail requesting a call back. °

## 2016-05-25 MED FILL — VYVANSE 20 MG CAPSULE: 20 | 30 days supply | Qty: 30 | Fill #0

## 2016-05-25 MED FILL — OMEPRAZOLE DR 20 MG CAPSULE: 20 | 90 days supply | Qty: 90 | Fill #1

## 2016-05-28 ENCOUNTER — Encounter: Payer: Self-pay | Admitting: Obstetrics and Gynecology

## 2016-05-28 ENCOUNTER — Ambulatory Visit (INDEPENDENT_AMBULATORY_CARE_PROVIDER_SITE_OTHER): Payer: 59

## 2016-05-28 ENCOUNTER — Ambulatory Visit (INDEPENDENT_AMBULATORY_CARE_PROVIDER_SITE_OTHER): Payer: 59 | Admitting: Obstetrics and Gynecology

## 2016-05-28 VITALS — BP 102/74 | HR 80 | Ht 62.5 in | Wt 124.0 lb

## 2016-05-28 DIAGNOSIS — N946 Dysmenorrhea, unspecified: Secondary | ICD-10-CM

## 2016-05-28 DIAGNOSIS — N926 Irregular menstruation, unspecified: Secondary | ICD-10-CM | POA: Diagnosis not present

## 2016-05-28 DIAGNOSIS — N398 Other specified disorders of urinary system: Secondary | ICD-10-CM | POA: Diagnosis not present

## 2016-05-28 NOTE — Patient Instructions (Signed)
Norethindrone tablets (contraception) What is this medicine? NORETHINDRONE (nor eth IN drone) is an oral contraceptive. The product contains a female hormone known as a progestin. It is used to prevent pregnancy. This medicine may be used for other purposes; ask your health care provider or pharmacist if you have questions. What should I tell my health care provider before I take this medicine? They need to know if you have any of these conditions: -blood vessel disease or blood clots -breast, cervical, or vaginal cancer -diabetes -heart disease -kidney disease -liver disease -mental depression -migraine -seizures -stroke -vaginal bleeding -an unusual or allergic reaction to norethindrone, other medicines, foods, dyes, or preservatives -pregnant or trying to get pregnant -breast-feeding How should I use this medicine? Take this medicine by mouth with a glass of water. You may take it with or without food. Follow the directions on the prescription label. Take this medicine at the same time each day and in the order directed on the package. Do not take your medicine more often than directed. Contact your pediatrician regarding the use of this medicine in children. Special care may be needed. This medicine has been used in female children who have started having menstrual periods. A patient package insert for the product will be given with each prescription and refill. Read this sheet carefully each time. The sheet may change frequently. Overdosage: If you think you have taken too much of this medicine contact a poison control center or emergency room at once. NOTE: This medicine is only for you. Do not share this medicine with others. What if I miss a dose? Try not to miss a dose. Every time you miss a dose or take a dose late your chance of pregnancy increases. When 1 pill is missed (even if only 3 hours late), take the missed pill as soon as possible and continue taking a pill each day at  the regular time (use a back up method of birth control for the next 48 hours). If more than 1 dose is missed, use an additional birth control method for the rest of your pill pack until menses occurs. Contact your health care professional if more than 1 dose has been missed. What may interact with this medicine? Do not take this medicine with any of the following medications: -amprenavir or fosamprenavir -bosentan This medicine may also interact with the following medications: -antibiotics or medicines for infections, especially rifampin, rifabutin, rifapentine, and griseofulvin, and possibly penicillins or tetracyclines -aprepitant -barbiturate medicines, such as phenobarbital -carbamazepine -felbamate -modafinil -oxcarbazepine -phenytoin -ritonavir or other medicines for HIV infection or AIDS -St. John's wort -topiramate This list may not describe all possible interactions. Give your health care provider a list of all the medicines, herbs, non-prescription drugs, or dietary supplements you use. Also tell them if you smoke, drink alcohol, or use illegal drugs. Some items may interact with your medicine. What should I watch for while using this medicine? Visit your doctor or health care professional for regular checks on your progress. You will need a regular breast and pelvic exam and Pap smear while on this medicine. Use an additional method of birth control during the first cycle that you take these tablets. If you have any reason to think you are pregnant, stop taking this medicine right away and contact your doctor or health care professional. If you are taking this medicine for hormone related problems, it may take several cycles of use to see improvement in your condition. This medicine does not protect you   against HIV infection (AIDS) or any other sexually transmitted diseases. What side effects may I notice from receiving this medicine? Side effects that you should report to your  doctor or health care professional as soon as possible: -breast tenderness or discharge -pain in the abdomen, chest, groin or leg -severe headache -skin rash, itching, or hives -sudden shortness of breath -unusually weak or tired -vision or speech problems -yellowing of skin or eyes Side effects that usually do not require medical attention (report to your doctor or health care professional if they continue or are bothersome): -changes in sexual desire -change in menstrual flow -facial hair growth -fluid retention and swelling -headache -irritability -nausea -weight gain or loss This list may not describe all possible side effects. Call your doctor for medical advice about side effects. You may report side effects to FDA at 1-800-FDA-1088. Where should I keep my medicine? Keep out of the reach of children. Store at room temperature between 15 and 30 degrees C (59 and 86 degrees F). Throw away any unused medicine after the expiration date. NOTE: This sheet is a summary. It may not cover all possible information. If you have questions about this medicine, talk to your doctor, pharmacist, or health care provider.    2016, Elsevier/Gold Standard. (2012-04-01 16:41:35)  

## 2016-05-28 NOTE — Progress Notes (Signed)
Patient ID: [name], female   DOB: 1971/05/16, 45 y.o.   MRN: [id number] GYNECOLOGY  VISIT   HPI: 45 y.o.   Single  Caucasian  female   [id number] with Patient's last menstrual period was 04/29/2016 (approximate).   here for pelvic ultrasound for dysmenorrhea and irregular menses.  Menses every 2 - 6 weeks and bleeding is painful.  Pain is the main issue for her.  Bleeding is very light.  She is not really tracking menses specifically since ablation. Cycles were very rare following the ablation but they have come more often for the last year. Did not tolerate LoLoestrin due to nausea.  Prior CT showing possible right ovarian dermoid.  Hx ant and post repair with grafting in 2008 or 2009 and a Spark midurethral sling.  States she does assisted voiding by pressing in the vagina.  Feels like she still has prolapse.  Patient has neuropathy following difficult vaginal delivery.   GYNECOLOGIC HISTORY: Patient's last menstrual period was 04/29/2016 (approximate). Contraception:  None/Novasure Menopausal hormone therapy:  none Last mammogram:  04-07-16 Density C/Left Br.possible mass, Rt.Br.neg;04-16-16 Lt.Br.US reveals indeterminate mass with slight suspicion over 9:30 position of Lt.Br.and rec.biopsy.04-22-16 US guided core Bx Lt.Br.with pathology revealed Fibroadenoma--return to annual screening.    Last pap smear:   04-24-15 Neg:Neg HR HPV         OB History    Gravida Para Term Preterm AB Living   1 1 1     1    SAB TAB Ectopic Multiple Live Births                     There are no active problems to display for this patient.   Past Medical History:  Diagnosis Date  . GERD (gastroesophageal reflux disease)   . Headache(784.0)    otc meds prn  . Hyperthyroidism    no meds  . Irregular heart beat    history - no treatment  Dr Wynonia Lawman  . PONV (postoperative nausea and vomiting)   . Right ovarian cyst 2016   CT scan - 1 cm, possible dermoid.  . Sinus tachycardia   . Urinary  incontinence    urgency and night urination    Past Surgical History:  Procedure Laterality Date  . ANTERIOR AND POSTERIOR REPAIR    . BREAST SURGERY  04/07/2016   Lt.Br.Bx--Fibroadenoma  . DIAGNOSTIC LAPAROSCOPY    . DILATION AND CURETTAGE OF UTERUS    . ENDOMETRIAL ABLATION  05/2011  . INCONTINENCE SURGERY    . LAPAROSCOPY  06/12/2011   Procedure: LAPAROSCOPY OPERATIVE;  Surgeon: Arloa Koh;  Location: [facility];  Service: Gynecology;  Laterality: N/A;   Left Ovarian Cystectomy  . NOSE SURGERY    . SHOULDER SURGERY     bilateral left x 2, right x 1 surgery  . svd      x 1  . UPPER GASTROINTESTINAL ENDOSCOPY    . WISDOM TOOTH EXTRACTION      Current Outpatient Prescriptions  Medication Sig Dispense Refill  . Cyanocobalamin (VITAMIN B12 PO) Take 1 each by mouth as needed. gummie     . lisdexamfetamine (VYVANSE) 20 MG capsule Take 20 mg by mouth as needed.    . Multiple Vitamins-Minerals (MULTIVITAMIN WITH MINERALS) tablet Take 1 tablet by mouth daily.      Marland Kitchen omeprazole-sodium bicarbonate (ZEGERID) 40-1100 MG per capsule Take 1 capsule by mouth daily before breakfast.      . oxyCODONE-acetaminophen (PERCOCET/ROXICET) 5-325 MG tablet  Take 1 tablet by mouth every 4 (four) hours. Prn pain  0  . mirabegron ER (MYRBETRIQ) 25 MG TB24 tablet Take 1 tablet (25 mg total) by mouth daily. One po qd (Patient not taking: Reported on 05/28/2016) 30 tablet 2   No current facility-administered medications for this visit.      ALLERGIES: Gadolinium derivatives; Augmentin [amoxicillin-pot clavulanate]; Buprenorphine hcl; Codeine; Iohexol; Morphine and related; Vicodin [hydrocodone-acetaminophen]; and Lisdexamfetamine  Family History  Problem Relation Age of Onset  . Fibromyalgia Mother   . Thyroid disease Mother   . Seizures Father     d/t head injury  . Cancer Father     ?stomach ca  . Cancer Paternal Grandmother     colon  . Hypertension Paternal Grandmother   . Stroke Paternal  Grandmother   . Diabetes Paternal Aunt   . Hypertension Maternal Grandmother   . Thyroid disease Maternal Grandmother     Social History   Social History  . Marital status: Single    Spouse name: N/A  . Number of children: N/A  . Years of education: N/A   Occupational History  . Not on file.   Social History Main Topics  . Smoking status: Never Smoker  . Smokeless tobacco: Never Used  . Alcohol use 0.0 oz/week     Comment: onl occasionally  . Drug use: No  . Sexual activity: Yes    Partners: Female     Comment: ablation-Novasure   Other Topics Concern  . Not on file   Social History Narrative  . No narrative on file    ROS:  Pertinent items are noted in HPI.  PHYSICAL EXAMINATION:    BP 102/74 (BP Location: Right Arm, Patient Position: Sitting, Cuff Size: Normal)   Pulse 80   Ht 5' 2.5" (1.588 m)   Wt 124 lb (56.2 kg)   LMP 04/29/2016 (Approximate)   BMI 22.32 kg/m     General appearance: alert, cooperative and appears stated age   Pelvic ultrasound -  Uterus no masses.  EMS - 5.15 mm.  Consistent with ablation.  Ovaries with follicles.  No free fluid.   ASSESSMENT  Metrorrhagia.  Dysmenorrhea.  Status post endometrial ablation.  Unremarkable pelvic ultrasound.  Voiding dysfunction.  Status post anterior and posterior colporrhaphy with Spark midurethral sling.    PLAN  Discussed Micronor and patient prefers to hold off on this.  She may contact the office back to ask for this prescription.   She will use Advil prn.  She will keep a bleeding calendar and report back if the cycles are happening more often then every 21 days.  Return for pessary fitting.  She will provide a copy of her surgical report from her prolapse and incontinence surgery.    An After Visit Summary was printed and given to the patient.  __25____ minutes face to face time of which over 50% was spent in counseling.

## 2016-05-28 NOTE — Progress Notes (Signed)
Encounter reviewed by Dr. Brook Amundson C. Silva.  

## 2016-06-08 DIAGNOSIS — J0191 Acute recurrent sinusitis, unspecified: Secondary | ICD-10-CM | POA: Diagnosis not present

## 2016-06-08 MED FILL — AZITHROMYCIN 250 MG TABLET: 250 | 5 days supply | Qty: 6 | Fill #0

## 2016-06-16 MED FILL — OXYCODONE W/APAP 5/325 TAB: 5-325 | 30 days supply | Qty: 30 | Fill #0

## 2016-06-16 MED FILL — DICLOFENAC SODIUM 1% GEL: 1 | 30 days supply | Qty: 300 | Fill #0

## 2016-06-19 DIAGNOSIS — H66001 Acute suppurative otitis media without spontaneous rupture of ear drum, right ear: Secondary | ICD-10-CM | POA: Diagnosis not present

## 2016-06-26 MED FILL — VYVANSE 20 MG CAPSULE: 20 | 30 days supply | Qty: 30 | Fill #0

## 2016-07-02 DIAGNOSIS — R0981 Nasal congestion: Secondary | ICD-10-CM | POA: Diagnosis not present

## 2016-07-02 DIAGNOSIS — H6981 Other specified disorders of Eustachian tube, right ear: Secondary | ICD-10-CM | POA: Diagnosis not present

## 2016-07-02 MED FILL — METHYLPREDNISOLONE 4 MG TAB: 4 | 7 days supply | Qty: 21 | Fill #0

## 2016-07-08 ENCOUNTER — Ambulatory Visit: Payer: 59 | Admitting: Obstetrics and Gynecology

## 2016-07-08 NOTE — Progress Notes (Deleted)
GYNECOLOGY  VISIT   HPI: 45 y.o.   Single  Caucasian  female   [id number] with No LMP recorded.   here for  Pessary fitting  GYNECOLOGIC HISTORY: No LMP recorded. Contraception:  None/novasure Menopausal hormone therapy:  none Last mammogram:  04-07-16 Density C/Left Br.possible mass, Rt.Br.neg;04-16-16 Lt.Br.US reveals indeterminate mass with slight suspicion over 9:30 position of Lt.Br.and rec.biopsy.04-22-16 US guided core Bx Lt.Br.with pathology revealed Fibroadenoma--return to annual screening. Last pap smear:  04-24-15 neg HPV HR neg        OB History    Gravida Para Term Preterm AB Living   1 1 1     1    SAB TAB Ectopic Multiple Live Births                     There are no active problems to display for this patient.   Past Medical History:  Diagnosis Date  . GERD (gastroesophageal reflux disease)   . Headache(784.0)    otc meds prn  . Hyperthyroidism    no meds  . Irregular heart beat    history - no treatment  Dr Wynonia Lawman  . PONV (postoperative nausea and vomiting)   . Right ovarian cyst 2016   CT scan - 1 cm, possible dermoid.  . Sinus tachycardia   . Urinary incontinence    urgency and night urination    Past Surgical History:  Procedure Laterality Date  . ANTERIOR AND POSTERIOR REPAIR    . BREAST SURGERY  04/07/2016   Lt.Br.Bx--Fibroadenoma  . DIAGNOSTIC LAPAROSCOPY    . DILATION AND CURETTAGE OF UTERUS    . ENDOMETRIAL ABLATION  05/2011  . INCONTINENCE SURGERY    . LAPAROSCOPY  06/12/2011   Procedure: LAPAROSCOPY OPERATIVE;  Surgeon: Arloa Koh;  Location: [facility];  Service: Gynecology;  Laterality: N/A;   Left Ovarian Cystectomy  . NOSE SURGERY    . SHOULDER SURGERY     bilateral left x 2, right x 1 surgery  . svd      x 1  . UPPER GASTROINTESTINAL ENDOSCOPY    . WISDOM TOOTH EXTRACTION      Current Outpatient Prescriptions  Medication Sig Dispense Refill  . Cyanocobalamin (VITAMIN B12 PO) Take 1 each by mouth as needed. gummie     .  lisdexamfetamine (VYVANSE) 20 MG capsule Take 20 mg by mouth as needed.    . mirabegron ER (MYRBETRIQ) 25 MG TB24 tablet Take 1 tablet (25 mg total) by mouth daily. One po qd (Patient not taking: Reported on 05/28/2016) 30 tablet 2  . Multiple Vitamins-Minerals (MULTIVITAMIN WITH MINERALS) tablet Take 1 tablet by mouth daily.      Marland Kitchen omeprazole-sodium bicarbonate (ZEGERID) 40-1100 MG per capsule Take 1 capsule by mouth daily before breakfast.      . oxyCODONE-acetaminophen (PERCOCET/ROXICET) 5-325 MG tablet Take 1 tablet by mouth every 4 (four) hours. Prn pain  0   No current facility-administered medications for this visit.      ALLERGIES: Gadolinium derivatives; Augmentin [amoxicillin-pot clavulanate]; Buprenorphine hcl; Codeine; Iohexol; Morphine and related; Vicodin [hydrocodone-acetaminophen]; and Lisdexamfetamine  Family History  Problem Relation Age of Onset  . Fibromyalgia Mother   . Thyroid disease Mother   . Seizures Father     d/t head injury  . Cancer Father     ?stomach ca  . Cancer Paternal Grandmother     colon  . Hypertension Paternal Grandmother   . Stroke Paternal Grandmother   . Diabetes Paternal Aunt   .  Hypertension Maternal Grandmother   . Thyroid disease Maternal Grandmother     Social History   Social History  . Marital status: Single    Spouse name: N/A  . Number of children: N/A  . Years of education: N/A   Occupational History  . Not on file.   Social History Main Topics  . Smoking status: Never Smoker  . Smokeless tobacco: Never Used  . Alcohol use 0.0 oz/week     Comment: onl occasionally  . Drug use: No  . Sexual activity: Yes    Partners: Female     Comment: ablation-Novasure   Other Topics Concern  . Not on file   Social History Narrative  . No narrative on file    ROS:  Pertinent items are noted in HPI.  PHYSICAL EXAMINATION:    There were no vitals taken for this visit.    General appearance: alert, cooperative and appears  stated age Head: Normocephalic, without obvious abnormality, atraumatic Neck: no adenopathy, supple, symmetrical, trachea midline and thyroid normal to inspection and palpation Lungs: clear to auscultation bilaterally Breasts: normal appearance, no masses or tenderness, No nipple retraction or dimpling, No nipple discharge or bleeding, No axillary or supraclavicular adenopathy Heart: regular rate and rhythm Abdomen: soft, non-tender, no masses,  no organomegaly Extremities: extremities normal, atraumatic, no cyanosis or edema Skin: Skin color, texture, turgor normal. No rashes or lesions Lymph nodes: Cervical, supraclavicular, and axillary nodes normal. No abnormal inguinal nodes palpated Neurologic: Grossly normal  Pelvic: External genitalia:  no lesions              Urethra:  normal appearing urethra with no masses, tenderness or lesions              Bartholins and Skenes: normal                 Vagina: normal appearing vagina with normal color and discharge, no lesions              Cervix: no lesions                Bimanual Exam:  Uterus:  normal size, contour, position, consistency, mobility, non-tender              Adnexa: no mass, fullness, tenderness              Rectal exam: {yes no:[id number]}.  Confirms.              Anus:  normal sphincter tone, no lesions  Chaperone was present for exam.  ASSESSMENT     PLAN     An After Visit Summary was printed and given to the patient.  ______ minutes face to face time of which over 50% was spent in counseling.

## 2016-07-09 ENCOUNTER — Telehealth: Payer: Self-pay | Admitting: Obstetrics and Gynecology

## 2016-07-09 ENCOUNTER — Ambulatory Visit: Payer: 59 | Admitting: Obstetrics and Gynecology

## 2016-07-09 ENCOUNTER — Encounter: Payer: Self-pay | Admitting: Obstetrics and Gynecology

## 2016-07-09 NOTE — Telephone Encounter (Signed)
Thanks for the update.  I did go ahead and close the encounter.

## 2016-07-09 NOTE — Telephone Encounter (Signed)
Called patient and left a message to call back and reschedule her missed appointment for a pessary fitting today.

## 2016-07-10 ENCOUNTER — Ambulatory Visit: Payer: 59 | Admitting: Obstetrics and Gynecology

## 2016-07-13 ENCOUNTER — Ambulatory Visit: Payer: 59 | Admitting: Obstetrics and Gynecology

## 2016-07-13 ENCOUNTER — Encounter: Payer: Self-pay | Admitting: Obstetrics and Gynecology

## 2016-07-13 VITALS — BP 104/60 | HR 70 | Resp 16 | Ht 62.5 in | Wt 126.0 lb

## 2016-07-13 DIAGNOSIS — N812 Incomplete uterovaginal prolapse: Secondary | ICD-10-CM

## 2016-07-13 NOTE — Progress Notes (Signed)
GYNECOLOGY  VISIT   HPI: 45 y.o.   Single  Caucasian  female   [id number] with Patient's last menstrual period was 07/11/2016.   here for pessary fitting.    Has spasms of the pelvic floor.  Occurs every 2 weeks.  Takes a stool softener.  GYNECOLOGIC HISTORY: Patient's last menstrual period was 07/11/2016. Contraception:  None/Novasure Menopausal hormone therapy:  none Last mammogram: 04-07-16 Density C/Left Br.possible mass, Rt.Br.neg;04-16-16 Lt.Br.US reveals indeterminate mass with slight suspicion over 9:30 position of Lt.Br.and rec.biopsy.04-22-16 US guided core Bx Lt.Br.with pathology revealed Fibroadenoma--return to annual screening.    Last pap smear:   04-24-15 Neg:Neg HR HPV         OB History    Gravida Para Term Preterm AB Living   1 1 1     1    SAB TAB Ectopic Multiple Live Births                     There are no active problems to display for this patient.   Past Medical History:  Diagnosis Date  . GERD (gastroesophageal reflux disease)   . Headache(784.0)    otc meds prn  . Hyperthyroidism    no meds  . Irregular heart beat    history - no treatment  Dr Wynonia Lawman  . PONV (postoperative nausea and vomiting)   . Right ovarian cyst 2016   CT scan - 1 cm, possible dermoid.  . Sinus tachycardia   . Urinary incontinence    urgency and night urination    Past Surgical History:  Procedure Laterality Date  . ANTERIOR AND POSTERIOR REPAIR    . BREAST SURGERY  04/07/2016   Lt.Br.Bx--Fibroadenoma  . DIAGNOSTIC LAPAROSCOPY    . DILATION AND CURETTAGE OF UTERUS    . ENDOMETRIAL ABLATION  05/2011  . INCONTINENCE SURGERY    . LAPAROSCOPY  06/12/2011   Procedure: LAPAROSCOPY OPERATIVE;  Surgeon: Arloa Koh;  Location: [facility];  Service: Gynecology;  Laterality: N/A;   Left Ovarian Cystectomy  . NOSE SURGERY    . SHOULDER SURGERY     bilateral left x 2, right x 1 surgery  . svd      x 1  . UPPER GASTROINTESTINAL ENDOSCOPY    . WISDOM TOOTH EXTRACTION       Current Outpatient Prescriptions  Medication Sig Dispense Refill  . Cyanocobalamin (VITAMIN B12 PO) Take 1 each by mouth as needed. gummie     . lisdexamfetamine (VYVANSE) 20 MG capsule Take 20 mg by mouth as needed.    . mirabegron ER (MYRBETRIQ) 25 MG TB24 tablet Take 1 tablet (25 mg total) by mouth daily. One po qd 30 tablet 2  . Multiple Vitamins-Minerals (MULTIVITAMIN WITH MINERALS) tablet Take 1 tablet by mouth daily.      Marland Kitchen omeprazole-sodium bicarbonate (ZEGERID) 40-1100 MG per capsule Take 1 capsule by mouth daily before breakfast.      . oxyCODONE-acetaminophen (PERCOCET/ROXICET) 5-325 MG tablet Take 1 tablet by mouth every 4 (four) hours. Prn pain  0   No current facility-administered medications for this visit.      ALLERGIES: Gadolinium derivatives; Augmentin [amoxicillin-pot clavulanate]; Buprenorphine hcl; Codeine; Iohexol; Morphine and related; Vicodin [hydrocodone-acetaminophen]; and Lisdexamfetamine  Family History  Problem Relation Age of Onset  . Fibromyalgia Mother   . Thyroid disease Mother   . Seizures Father     d/t head injury  . Cancer Father     ?stomach ca  . Cancer Paternal Grandmother  colon  . Hypertension Paternal Grandmother   . Stroke Paternal Grandmother   . Diabetes Paternal Aunt   . Hypertension Maternal Grandmother   . Thyroid disease Maternal Grandmother     Social History   Social History  . Marital status: Single    Spouse name: N/A  . Number of children: N/A  . Years of education: N/A   Occupational History  . Not on file.   Social History Main Topics  . Smoking status: Never Smoker  . Smokeless tobacco: Never Used  . Alcohol use 0.0 oz/week     Comment: only occasionally  . Drug use: No  . Sexual activity: Yes    Partners: Female     Comment: ablation-Novasure   Other Topics Concern  . Not on file   Social History Narrative  . No narrative on file    ROS:  Pertinent items are noted in HPI.  PHYSICAL  EXAMINATION:    BP 104/60   Pulse 70   Resp 16   Ht 5' 2.5" (1.588 m)   Wt 126 lb (57.2 kg)   LMP 07/11/2016 Comment: spotting  BMI 22.68 kg/m     General appearance: alert, cooperative and appears stated age   Pelvic: External genitalia:  no lesions              Urethra:  normal appearing urethra with no masses, tenderness or lesions              Bartholins and Skenes: normal                 Vagina: normal appearing vagina with normal color and discharge, no lesions.  Mild cystocele and rectocele.                Cervix: no lesions                Bimanual Exam:  Uterus:  normal size, contour, position, consistency, mobility, non-tender              Adnexa: no mass, fullness, tenderness               Pessary ring with support #4 and #3 too large and uncomfortable, and #2 comfortable with maneuvers.   Chaperone was present for exam.  ASSESSMENT  Incomplete uterovaginal prolapse.   PLAN  Pessary #2 ring with support fitted.  Will order for patient and have her return when it arrives.  May need to consider vaginal estrogen cream with pessary use.    An After Visit Summary was printed and given to the patient.  ___15___ minutes face to face time of which over 50% was spent in counseling.

## 2016-07-14 MED FILL — OXYCODONE W/APAP 5/325 TAB: 5-325 | 30 days supply | Qty: 30 | Fill #0

## 2016-07-23 MED FILL — FLUTICASONE PROP 50 MCG SPR: 50 | 30 days supply | Qty: 16 | Fill #0

## 2016-07-24 ENCOUNTER — Ambulatory Visit (INDEPENDENT_AMBULATORY_CARE_PROVIDER_SITE_OTHER): Payer: 59 | Admitting: Obstetrics and Gynecology

## 2016-07-24 ENCOUNTER — Encounter: Payer: Self-pay | Admitting: Obstetrics and Gynecology

## 2016-07-24 VITALS — BP 118/70 | HR 74 | Resp 12 | Ht 62.5 in | Wt 124.8 lb

## 2016-07-24 DIAGNOSIS — N812 Incomplete uterovaginal prolapse: Secondary | ICD-10-CM | POA: Diagnosis not present

## 2016-07-24 MED FILL — VYVANSE 20 MG CAPSULE: 20 | 30 days supply | Qty: 30 | Fill #0

## 2016-07-24 NOTE — Progress Notes (Signed)
GYNECOLOGY  VISIT   HPI: 45 y.o.   Single  Caucasian  female   [id number] with Patient's last menstrual period was 07/11/2016.   here for pessary placement    GYNECOLOGIC HISTORY: Patient's last menstrual period was 07/11/2016. Contraception:  Ablation Menopausal hormone therapy:  none Last mammogram: 04/07/16 BIRADS0, Density C, [facility]; 04/16/16 Dx & U/S L Breast; 04/21/16 L Breast Biopsy  Last pap smear:  9/28/6 WNL neg HR HPV        OB History    Gravida Para Term Preterm AB Living   1 1 1     1    SAB TAB Ectopic Multiple Live Births                     There are no active problems to display for this patient.   Past Medical History:  Diagnosis Date  . GERD (gastroesophageal reflux disease)   . Headache(784.0)    otc meds prn  . Hyperthyroidism    no meds  . Irregular heart beat    history - no treatment  Dr Wynonia Lawman  . PONV (postoperative nausea and vomiting)   . Right ovarian cyst 2016   CT scan - 1 cm, possible dermoid.  . Sinus tachycardia   . Urinary incontinence    urgency and night urination    Past Surgical History:  Procedure Laterality Date  . ANTERIOR AND POSTERIOR REPAIR    . BREAST SURGERY  04/07/2016   Lt.Br.Bx--Fibroadenoma  . DIAGNOSTIC LAPAROSCOPY    . DILATION AND CURETTAGE OF UTERUS    . ENDOMETRIAL ABLATION  05/2011  . INCONTINENCE SURGERY    . LAPAROSCOPY  06/12/2011   Procedure: LAPAROSCOPY OPERATIVE;  Surgeon: Arloa Koh;  Location: [facility];  Service: Gynecology;  Laterality: N/A;   Left Ovarian Cystectomy  . NOSE SURGERY    . SHOULDER SURGERY     bilateral left x 2, right x 1 surgery  . svd      x 1  . UPPER GASTROINTESTINAL ENDOSCOPY    . WISDOM TOOTH EXTRACTION      Current Outpatient Prescriptions  Medication Sig Dispense Refill  . Cyanocobalamin (VITAMIN B12 PO) Take 1 each by mouth as needed. gummie     . lisdexamfetamine (VYVANSE) 20 MG capsule Take 20 mg by mouth as needed.    . mirabegron ER (MYRBETRIQ) 25 MG TB24  tablet Take 1 tablet (25 mg total) by mouth daily. One po qd 30 tablet 2  . Multiple Vitamins-Minerals (MULTIVITAMIN WITH MINERALS) tablet Take 1 tablet by mouth daily.      Marland Kitchen omeprazole-sodium bicarbonate (ZEGERID) 40-1100 MG per capsule Take 1 capsule by mouth daily before breakfast.      . oxyCODONE-acetaminophen (PERCOCET/ROXICET) 5-325 MG tablet Take 1 tablet by mouth every 4 (four) hours. Prn pain  0   No current facility-administered medications for this visit.      ALLERGIES: Gadolinium derivatives; Augmentin [amoxicillin-pot clavulanate]; Buprenorphine hcl; Codeine; Iohexol; Morphine and related; Vicodin [hydrocodone-acetaminophen]; and Lisdexamfetamine  Family History  Problem Relation Age of Onset  . Fibromyalgia Mother   . Thyroid disease Mother   . Seizures Father     d/t head injury  . Cancer Father     ?stomach ca  . Cancer Paternal Grandmother     colon  . Hypertension Paternal Grandmother   . Stroke Paternal Grandmother   . Diabetes Paternal Aunt   . Hypertension Maternal Grandmother   . Thyroid disease Maternal Grandmother  Social History   Social History  . Marital status: Single    Spouse name: N/A  . Number of children: N/A  . Years of education: N/A   Occupational History  . Not on file.   Social History Main Topics  . Smoking status: Never Smoker  . Smokeless tobacco: Never Used  . Alcohol use 0.0 oz/week     Comment: only occasionally  . Drug use: No  . Sexual activity: Yes    Partners: Female     Comment: ablation-Novasure   Other Topics Concern  . Not on file   Social History Narrative  . No narrative on file    ROS:  Pertinent items are noted in HPI.  PHYSICAL EXAMINATION:    BP 118/70 (BP Location: Right Arm, Patient Position: Sitting, Cuff Size: Normal)   Pulse 74   Resp 12   Ht 5' 2.5" (1.588 m)   Wt 124 lb 12.8 oz (56.6 kg)   LMP 07/11/2016 Comment: spotting  BMI 22.46 kg/m     General appearance: alert, cooperative  and appears stated age   Pessary comfortable for patient and able to maintain it.   Chaperone was present for exam.  ASSESSMENT  Incomplete uterovaginal prolapse.   PLAN  Instructed in pessary use, placement and removal, and care. Pessary - Premier #2 ring with support, Lot number [id number] to patient.  Follow up in 1 - 2 weeks.   An After Visit Summary was printed and given to the patient.  ____15__ minutes face to face time of which over 50% was spent in counseling.

## 2016-08-17 MED FILL — FLUTICASONE PROP 50 MCG SPR: 50 | 30 days supply | Qty: 16 | Fill #1

## 2016-08-17 MED FILL — OMEPRAZOLE DR 20 MG CAPSULE: 20 | 90 days supply | Qty: 90 | Fill #2

## 2016-08-18 MED FILL — DICLOFENAC SODIUM 1% GEL: 1 | 30 days supply | Qty: 300 | Fill #0

## 2016-08-18 MED FILL — OXYCODONE W/APAP 5/325 TAB: 5-325 | 30 days supply | Qty: 30 | Fill #0

## 2016-09-01 MED FILL — ADDERALL XR 20 MG CAP SA: 20 | 30 days supply | Qty: 30 | Fill #0

## 2016-09-14 MED FILL — METHOCARBAMOL 500 MG TABLET: 500 | 10 days supply | Qty: 30 | Fill #0

## 2016-09-15 MED FILL — OXYCODONE W/APAP 5/325 TAB: 5-325 | 30 days supply | Qty: 30 | Fill #0

## 2016-10-13 MED FILL — OXYCODONE W/APAP 5/325 TAB: 5-325 | 30 days supply | Qty: 30 | Fill #0

## 2016-10-13 MED FILL — FLUTICASONE PROP 50 MCG SPR: 50 | 30 days supply | Qty: 16 | Fill #2

## 2016-10-20 DIAGNOSIS — F902 Attention-deficit hyperactivity disorder, combined type: Secondary | ICD-10-CM | POA: Diagnosis not present

## 2016-11-11 MED FILL — OXYCODONE W/APAP 5/325 TAB: 5-325 | 30 days supply | Qty: 30 | Fill #0

## 2016-11-12 MED FILL — OMEPRAZOLE DR 20 MG CAPSULE: 20 | 90 days supply | Qty: 90 | Fill #3

## 2016-11-12 MED FILL — FLUTICASONE PROP 50 MCG SPR: 50 | 30 days supply | Qty: 16 | Fill #3

## 2016-11-12 MED FILL — AMPHETAMINE SALTS 10 MG TAB: 10 | 30 days supply | Qty: 60 | Fill #0

## 2016-12-01 MED FILL — diazePAM 5 MG TABS: 5 | 1 days supply | Qty: 2 | Fill #0

## 2016-12-11 MED FILL — DICLOFENAC SODIUM 1% GEL: 1 | 30 days supply | Qty: 300 | Fill #0

## 2016-12-11 MED FILL — OXYCODONE W/APAP 5/325 TAB: 5-325 | 30 days supply | Qty: 30 | Fill #0

## 2017-01-05 MED FILL — FLUTICASONE PROP 50 MCG SPR: 50 | 30 days supply | Qty: 16 | Fill #0

## 2017-01-05 MED FILL — AMPHETAMINE SALTS 10 MG TAB: 10 | 30 days supply | Qty: 60 | Fill #0

## 2017-01-14 MED FILL — OXYCODONE W/APAP 5/325 TAB: 5-325 | 30 days supply | Qty: 30 | Fill #0

## 2017-01-14 MED FILL — DICLOFENAC SODIUM 1% GEL: 1 | 30 days supply | Qty: 300 | Fill #0

## 2017-01-25 MED FILL — OMEPRAZOLE DR 20 MG CAPSULE: 20 | 90 days supply | Qty: 90 | Fill #0

## 2017-02-10 DIAGNOSIS — K449 Diaphragmatic hernia without obstruction or gangrene: Secondary | ICD-10-CM | POA: Diagnosis not present

## 2017-02-15 MED FILL — OXYCODONE W/APAP 5/325 TAB: 5-325 | 30 days supply | Qty: 30 | Fill #0

## 2017-02-17 MED FILL — FLUTICASONE PROP 50 MCG SPR: 50 | 90 days supply | Qty: 48 | Fill #1

## 2017-03-17 MED FILL — DICLOFENAC SODIUM 1% GEL: 1 | 30 days supply | Qty: 300 | Fill #0

## 2017-03-17 MED FILL — OXYCODONE W/APAP 5/325 TAB: 5-325 | 30 days supply | Qty: 30 | Fill #0

## 2017-04-01 ENCOUNTER — Telehealth: Payer: Self-pay | Admitting: Obstetrics and Gynecology

## 2017-04-01 NOTE — Telephone Encounter (Signed)
Patient is having abnormal bleeding. °

## 2017-04-01 NOTE — Telephone Encounter (Signed)
Spoke with patient. Patient has had an ablation in 2012. Reports since ablation has has slight spotting every other month. Yesterday the patient went to lay down after exercising and had a "huge gush of bleeding. It was all soaked in the bed and on the carpet. I was passing clots the size of my hand." States this lasted for 24 hours. Is not having any bleeding today. Feels very fatigued and nauseated. Advised will need to be seen for further evaluation. Patient is agreeable. Appointment scheduled for tomorrow 04/02/2017 at 9:15 am with Dr.Jertson. Patient is agreeable to date and time.  Routing to covering provider for final review. Patient agreeable to disposition. Will close encounter.

## 2017-04-02 ENCOUNTER — Ambulatory Visit (INDEPENDENT_AMBULATORY_CARE_PROVIDER_SITE_OTHER): Payer: 59 | Admitting: Obstetrics and Gynecology

## 2017-04-02 ENCOUNTER — Encounter: Payer: Self-pay | Admitting: Obstetrics and Gynecology

## 2017-04-02 VITALS — BP 116/62 | HR 84 | Resp 16 | Wt 124.0 lb

## 2017-04-02 DIAGNOSIS — Z9889 Other specified postprocedural states: Secondary | ICD-10-CM

## 2017-04-02 DIAGNOSIS — R3911 Hesitancy of micturition: Secondary | ICD-10-CM | POA: Diagnosis not present

## 2017-04-02 DIAGNOSIS — K5900 Constipation, unspecified: Secondary | ICD-10-CM | POA: Diagnosis not present

## 2017-04-02 DIAGNOSIS — R102 Pelvic and perineal pain: Secondary | ICD-10-CM

## 2017-04-02 DIAGNOSIS — N939 Abnormal uterine and vaginal bleeding, unspecified: Secondary | ICD-10-CM | POA: Diagnosis not present

## 2017-04-02 LAB — POCT URINALYSIS DIPSTICK
Bilirubin, UA: NEGATIVE
Blood, UA: NEGATIVE
Glucose, UA: NEGATIVE
Ketones, UA: NEGATIVE
Nitrite, UA: NEGATIVE
Protein, UA: NEGATIVE
Urobilinogen, UA: 0.2 E.U./dL
pH, UA: 8 (ref 5.0–8.0)

## 2017-04-02 LAB — POCT URINE PREGNANCY: Preg Test, Ur: NEGATIVE

## 2017-04-02 NOTE — Progress Notes (Signed)
GYNECOLOGY  VISIT   HPI: 46 y.o.   Single  Caucasian  female   [id number] with Patient's last menstrual period was 03/29/2017.   here for  Irregular bleeding/ fatigue, nausea  She had an endometrial ablation several years ago. She typically just has light spotting for a day every month with cramps. On Monday she had a large amount of bleeding for about an hour. Bleed through her bed, on the carpet, had a handful of clots. Since then she has some spotting. She has been having intermittent cramps since Monday, moderate. She also c/o an intermittent, pinching pain in her LLQ in the last few days, moderate. Typically gets a headache prior to the spotting, didn't happen this month. Cramps have been less intense with this episode of bleeding than typical.  She has had some nausea, not yet today. She c/o fatigue. Not dizzy, no emesis, no change in constipation. She is having some issue initiating her urinary flow in the last week or so. She doesn't think the blood came from her bladder. No urinary urgency, frequency or dysuria. Sometimes needs to double void, or push on her bladder to empty. She has had a couple of incontinence surgeries.  She has a female partner.  She had a traumatic delivery, never the same, has lower extremity weakness, bladder issues. Daughter is 51.   She is a critical care nurse.   GYNECOLOGIC HISTORY: Patient's last menstrual period was 03/29/2017. Contraception:Ablation Menopausal hormone therapy: none        OB History    Gravida Para Term Preterm AB Living   1 1 1     1    SAB TAB Ectopic Multiple Live Births                     There are no active problems to display for this patient.   Past Medical History:  Diagnosis Date  . GERD (gastroesophageal reflux disease)   . Headache(784.0)    otc meds prn  . Hyperthyroidism    no meds  . Irregular heart beat    history - no treatment  Dr Wynonia Lawman  . PONV (postoperative nausea and vomiting)   . Right ovarian cyst 2016    CT scan - 1 cm, possible dermoid.  . Sinus tachycardia   . Urinary incontinence    urgency and night urination    Past Surgical History:  Procedure Laterality Date  . ANTERIOR AND POSTERIOR REPAIR    . BREAST SURGERY  04/07/2016   Lt.Br.Bx--Fibroadenoma  . DIAGNOSTIC LAPAROSCOPY    . DILATION AND CURETTAGE OF UTERUS    . ENDOMETRIAL ABLATION  05/2011  . INCONTINENCE SURGERY    . LAPAROSCOPY  06/12/2011   Procedure: LAPAROSCOPY OPERATIVE;  Surgeon: Arloa Koh;  Location: [facility];  Service: Gynecology;  Laterality: N/A;   Left Ovarian Cystectomy  . NOSE SURGERY    . SHOULDER SURGERY     bilateral left x 2, right x 1 surgery  . svd      x 1  . UPPER GASTROINTESTINAL ENDOSCOPY    . WISDOM TOOTH EXTRACTION      Current Outpatient Prescriptions  Medication Sig Dispense Refill  . amphetamine-dextroamphetamine (ADDERALL) 10 MG tablet Take 10 mg by mouth 2 (two) times daily.  0  . cetirizine (ZYRTEC) 10 MG tablet Take 10 mg by mouth daily.    . Cyanocobalamin (VITAMIN B12 PO) Take 1 each by mouth as needed. gummie     . diclofenac  sodium (VOLTAREN) 1 % GEL as needed.  0  . fluticasone (FLONASE) 50 MCG/ACT nasal spray Place 2 sprays into both nostrils daily.  3  . lisdexamfetamine (VYVANSE) 20 MG capsule Take 20 mg by mouth as needed.    . Multiple Vitamins-Minerals (MULTIVITAMIN WITH MINERALS) tablet Take 1 tablet by mouth daily.      Marland Kitchen omeprazole-sodium bicarbonate (ZEGERID) 40-1100 MG per capsule Take 1 capsule by mouth daily before breakfast.      . oxyCODONE-acetaminophen (PERCOCET/ROXICET) 5-325 MG tablet Take 1 tablet by mouth every 4 (four) hours. Prn pain  0   No current facility-administered medications for this visit.      ALLERGIES: Gadolinium derivatives; Augmentin [amoxicillin-pot clavulanate]; Buprenorphine hcl; Codeine; Iohexol; Morphine and related; Vicodin [hydrocodone-acetaminophen]; and Lisdexamfetamine  Family History  Problem Relation Age of Onset  .  Fibromyalgia Mother   . Thyroid disease Mother   . Seizures Father        d/t head injury  . Cancer Father        ?stomach ca  . Cancer Paternal Grandmother        colon  . Hypertension Paternal Grandmother   . Stroke Paternal Grandmother   . Diabetes Paternal Aunt   . Hypertension Maternal Grandmother   . Thyroid disease Maternal Grandmother     Social History   Social History  . Marital status: Single    Spouse name: N/A  . Number of children: N/A  . Years of education: N/A   Occupational History  . Not on file.   Social History Main Topics  . Smoking status: Never Smoker  . Smokeless tobacco: Never Used  . Alcohol use 0.0 oz/week     Comment: only occasionally  . Drug use: No  . Sexual activity: Yes    Partners: Female     Comment: ablation-Novasure   Other Topics Concern  . Not on file   Social History Narrative  . No narrative on file    Review of Systems  Constitutional: Positive for malaise/fatigue.  HENT: Negative.   Eyes: Negative.   Respiratory: Negative.   Cardiovascular: Negative.   Gastrointestinal: Positive for nausea.  Genitourinary: Positive for frequency.       Irregular bleeding   Musculoskeletal: Negative.   Skin: Negative.   Neurological: Negative.   Endo/Heme/Allergies: Negative.   Psychiatric/Behavioral: Negative.     PHYSICAL EXAMINATION:    BP 116/62 (BP Location: Right Arm, Patient Position: Sitting, Cuff Size: Normal)   Pulse 84   Resp 16   Wt 124 lb (56.2 kg)   LMP 03/29/2017   BMI 22.32 kg/m     General appearance: alert, cooperative and appears stated age Neck: no adenopathy, supple, symmetrical, trachea midline and thyroid normal to inspection and palpation CV: regular rate and rhythm, no murmur Abdomen: soft, mildly tender in the LLQ (she states always tender), no rebound, no guarding, mildly distended, no masses,  no organomegaly  Pelvic: External genitalia:  no lesions              Urethra:  normal appearing  urethra with no masses, tenderness or lesions              Bartholins and Skenes: normal                 Vagina: normal appearing vagina with normal color and discharge, no lesions              Cervix: no cervical motion tenderness and no lesions  Bimanual Exam:  Uterus:  uterus normal sized, anteverted, +/- tender, mobile              Adnexa: no masses, mildly tender on the right               Chaperone was present for exam.  ASSESSMENT Abnormal uterine bleeding H/O endometrial ablation Pelvic pain, moderate Constipation, unchanged Urinary hesitency    PLAN Urine dip trace leuk Send urine for ua, c&s Set up a pelvic ultrasound, possible endometrial biopsy Discussed that her cavity is likely scarred by her prior endometrial ablation.  CBC, TSH   An After Visit Summary was printed and given to the patient.  CC: Dr Quincy Simmonds

## 2017-04-03 LAB — CBC
Hematocrit: 40.9 % (ref 34.0–46.6)
Hemoglobin: 13.6 g/dL (ref 11.1–15.9)
MCH: 32.1 pg (ref 26.6–33.0)
MCHC: 33.3 g/dL (ref 31.5–35.7)
MCV: 97 fL (ref 79–97)
Platelets: 424 10*3/uL — ABNORMAL HIGH (ref 150–379)
RBC: 4.24 x10E6/uL (ref 3.77–5.28)
RDW: 13.8 % (ref 12.3–15.4)
WBC: 6.7 10*3/uL (ref 3.4–10.8)

## 2017-04-03 LAB — URINALYSIS, MICROSCOPIC ONLY: Casts: NONE SEEN /lpf

## 2017-04-03 LAB — TSH: TSH: 1.04 u[IU]/mL (ref 0.450–4.500)

## 2017-04-03 LAB — URINE CULTURE

## 2017-04-06 ENCOUNTER — Encounter: Payer: Self-pay | Admitting: Obstetrics and Gynecology

## 2017-04-06 ENCOUNTER — Ambulatory Visit (INDEPENDENT_AMBULATORY_CARE_PROVIDER_SITE_OTHER): Payer: 59 | Admitting: Obstetrics and Gynecology

## 2017-04-06 ENCOUNTER — Ambulatory Visit (INDEPENDENT_AMBULATORY_CARE_PROVIDER_SITE_OTHER): Payer: 59

## 2017-04-06 VITALS — BP 104/68 | HR 84 | Resp 14 | Wt 122.0 lb

## 2017-04-06 DIAGNOSIS — N939 Abnormal uterine and vaginal bleeding, unspecified: Secondary | ICD-10-CM

## 2017-04-06 DIAGNOSIS — Z9889 Other specified postprocedural states: Secondary | ICD-10-CM | POA: Diagnosis not present

## 2017-04-06 NOTE — Progress Notes (Signed)
GYNECOLOGY  VISIT   HPI: 46 y.o.   Single  Caucasian  female   [id number] with Patient's last menstrual period was 03/29/2017.   here for follow up AUB   GYNECOLOGIC HISTORY: Patient's last menstrual period was 03/29/2017. Contraception:none (female partner) Menopausal hormone therapy: none         OB History    Gravida Para Term Preterm AB Living   1 1 1     1    SAB TAB Ectopic Multiple Live Births                     There are no active problems to display for this patient.   Past Medical History:  Diagnosis Date  . GERD (gastroesophageal reflux disease)   . Headache(784.0)    otc meds prn  . Hyperthyroidism    no meds  . Irregular heart beat    history - no treatment  Dr Wynonia Lawman  . PONV (postoperative nausea and vomiting)   . Right ovarian cyst 2016   CT scan - 1 cm, possible dermoid.  . Sinus tachycardia   . Urinary incontinence    urgency and night urination    Past Surgical History:  Procedure Laterality Date  . ANTERIOR AND POSTERIOR REPAIR    . BREAST SURGERY  04/07/2016   Lt.Br.Bx--Fibroadenoma  . DIAGNOSTIC LAPAROSCOPY    . DILATION AND CURETTAGE OF UTERUS    . ENDOMETRIAL ABLATION  05/2011  . INCONTINENCE SURGERY    . LAPAROSCOPY  06/12/2011   Procedure: LAPAROSCOPY OPERATIVE;  Surgeon: Arloa Koh;  Location: [facility];  Service: Gynecology;  Laterality: N/A;   Left Ovarian Cystectomy  . NOSE SURGERY    . SHOULDER SURGERY     bilateral left x 2, right x 1 surgery  . svd      x 1  . UPPER GASTROINTESTINAL ENDOSCOPY    . WISDOM TOOTH EXTRACTION      Current Outpatient Prescriptions  Medication Sig Dispense Refill  . amphetamine-dextroamphetamine (ADDERALL) 10 MG tablet Take 10 mg by mouth 2 (two) times daily.  0  . cetirizine (ZYRTEC) 10 MG tablet Take 10 mg by mouth daily.    . Cyanocobalamin (VITAMIN B12 PO) Take 1 each by mouth as needed. gummie     . diclofenac sodium (VOLTAREN) 1 % GEL as needed.  0  . fluticasone (FLONASE) 50  MCG/ACT nasal spray Place 2 sprays into both nostrils daily.  3  . Multiple Vitamins-Minerals (MULTIVITAMIN WITH MINERALS) tablet Take 1 tablet by mouth daily.      Marland Kitchen omeprazole-sodium bicarbonate (ZEGERID) 40-1100 MG per capsule Take 1 capsule by mouth daily before breakfast.      . oxyCODONE-acetaminophen (PERCOCET/ROXICET) 5-325 MG tablet Take 1 tablet by mouth every 4 (four) hours. Prn pain  0   No current facility-administered medications for this visit.      ALLERGIES: Gadolinium derivatives; Augmentin [amoxicillin-pot clavulanate]; Buprenorphine hcl; Codeine; Iohexol; Morphine and related; Vicodin [hydrocodone-acetaminophen]; and Lisdexamfetamine  Family History  Problem Relation Age of Onset  . Fibromyalgia Mother   . Thyroid disease Mother   . Seizures Father        d/t head injury  . Cancer Father        ?stomach ca  . Cancer Paternal Grandmother        colon  . Hypertension Paternal Grandmother   . Stroke Paternal Grandmother   . Diabetes Paternal Aunt   . Hypertension Maternal Grandmother   .  Thyroid disease Maternal Grandmother     Social History   Social History  . Marital status: Single    Spouse name: N/A  . Number of children: N/A  . Years of education: N/A   Occupational History  . Not on file.   Social History Main Topics  . Smoking status: Never Smoker  . Smokeless tobacco: Never Used  . Alcohol use 0.0 oz/week     Comment: only occasionally  . Drug use: No  . Sexual activity: Yes    Partners: Female     Comment: ablation-Novasure   Other Topics Concern  . Not on file   Social History Narrative  . No narrative on file    Review of Systems  Constitutional: Negative.   HENT: Negative.   Eyes: Negative.   Respiratory: Negative.   Cardiovascular: Negative.   Gastrointestinal: Negative.   Genitourinary: Negative.   Musculoskeletal: Negative.   Skin: Negative.   Neurological: Negative.   Endo/Heme/Allergies: Negative.     Psychiatric/Behavioral: Negative.     PHYSICAL EXAMINATION:    BP 104/68 (BP Location: Right Arm, Patient Position: Sitting, Cuff Size: Normal)   Pulse 84   Resp 14   Wt 122 lb (55.3 kg)   LMP 03/29/2017   BMI 21.96 kg/m     General appearance: alert, cooperative and appears stated age  ASSESSMENT One episode of abnormal bleeding with a gush of blood. Normal CBC, TSH, negative UPT, urine negative for blood. Patient with a h/o endometrial ablation and likely scarring of her cavity Ultrasound with thin endometrial stripe, no obvious source of bleeding  PLAN Calendar all bleeding F/U for an annual exam with Dr Quincy Simmonds in 6 weeks, will review any bleeding at that time Given thin endometrium and h/o endometrial ablation, will not do an endometrial biopsy at this time Discussed that if she has persistent abnormal uterine bleeding, she will need further w/u/treatment   An After Visit Summary was printed and given to the patient.  CC: Dr Quincy Simmonds

## 2017-04-20 MED FILL — OXYCODONE W/APAP 5/325 TAB: 5-325 | 30 days supply | Qty: 30 | Fill #0

## 2017-04-20 MED FILL — CYCLOBENZAPRINE 10 MG TAB: 10 | 30 days supply | Qty: 30 | Fill #0

## 2017-04-20 MED FILL — DICLOFENAC SODIUM 1% GEL: 1 | 30 days supply | Qty: 300 | Fill #0

## 2017-04-22 DIAGNOSIS — J309 Allergic rhinitis, unspecified: Secondary | ICD-10-CM | POA: Diagnosis not present

## 2017-04-22 DIAGNOSIS — F902 Attention-deficit hyperactivity disorder, combined type: Secondary | ICD-10-CM | POA: Diagnosis not present

## 2017-04-27 MED FILL — diazePAM 5 MG TABS: 5 | 1 days supply | Qty: 2 | Fill #0

## 2017-04-29 MED FILL — OMEPRAZOLE 20 MG CAP: 20 | 90 days supply | Qty: 90 | Fill #0

## 2017-04-30 MED FILL — FLUTICASONE PROP 50 MCG SPR: 50 | 90 days supply | Qty: 48 | Fill #0

## 2017-05-24 MED FILL — OXYCODONE W/APAP 5/325 TAB: 5-325 | 30 days supply | Qty: 30 | Fill #0

## 2017-05-24 MED FILL — CYCLOBENZAPRINE 10 MG TAB: 10 | 30 days supply | Qty: 30 | Fill #0

## 2017-05-25 ENCOUNTER — Ambulatory Visit: Payer: 59 | Admitting: Obstetrics and Gynecology

## 2017-06-11 MED FILL — DEXTROAMP-AMP 10 MG TAB: 10 | 30 days supply | Qty: 60 | Fill #0

## 2017-06-22 MED FILL — DICLOFENAC SODIUM 1% GEL: 1 | 30 days supply | Qty: 300 | Fill #0

## 2017-06-22 MED FILL — OXYCODONE-ACETAMINOPHEN 5-3: 5-325 | 30 days supply | Qty: 30 | Fill #0

## 2017-07-16 DIAGNOSIS — H524 Presbyopia: Secondary | ICD-10-CM | POA: Diagnosis not present

## 2017-07-21 MED FILL — OMEPRAZOLE 20 MG CAP: 20 | 90 days supply | Qty: 90 | Fill #1

## 2017-07-22 MED FILL — CYCLOBENZAPRINE 10 MG TAB: 10 | 30 days supply | Qty: 30 | Fill #0

## 2017-07-22 MED FILL — OXYCODONE-ACETAMINOPHEN 5-3: 5-325 | 30 days supply | Qty: 30 | Fill #0

## 2017-07-27 DIAGNOSIS — N898 Other specified noninflammatory disorders of vagina: Secondary | ICD-10-CM

## 2017-07-27 HISTORY — DX: Other specified noninflammatory disorders of vagina: N89.8

## 2017-08-12 NOTE — Progress Notes (Deleted)
47 y.o. G30P1001 Single Caucasian female here for annual exam.    PCP:     No LMP recorded. Patient is not currently having periods (Reason: Other).           Sexually active: {yes no:314532}  The current method of family planning is none/Novasure.    Exercising: {yes no:314532}  {types:19826} Smoker:  no  Health Maintenance: Pap: 04-24-15 Neg:Neg HR HPV, 07-31-13 Neg:Neg HR HPV History of abnormal Pap:  no MMG: ??04-06-16  See Epic Colonoscopy:  n/a BMD:   n/a  Result  n/a TDaP:  Up to date through [facility] Gardasil:   no HIV: Neg in pregnancy Hep C: *** Screening Labs:  Hb today: ***, Urine today: ***   reports that  has never smoked. she has never used smokeless tobacco. She reports that she drinks alcohol. She reports that she does not use drugs.  Past Medical History:  Diagnosis Date  . GERD (gastroesophageal reflux disease)   . Headache(784.0)    otc meds prn  . Hyperthyroidism    no meds  . Irregular heart beat    history - no treatment  Dr Wynonia Lawman  . PONV (postoperative nausea and vomiting)   . Right ovarian cyst 2016   CT scan - 1 cm, possible dermoid.  . Sinus tachycardia   . Urinary incontinence    urgency and night urination    Past Surgical History:  Procedure Laterality Date  . ANTERIOR AND POSTERIOR REPAIR    . BREAST SURGERY  04/07/2016   Lt.Br.Bx--Fibroadenoma  . DIAGNOSTIC LAPAROSCOPY    . DILATION AND CURETTAGE OF UTERUS    . ENDOMETRIAL ABLATION  05/2011  . INCONTINENCE SURGERY    . LAPAROSCOPY  06/12/2011   Procedure: LAPAROSCOPY OPERATIVE;  Surgeon: Arloa Koh;  Location: [facility];  Service: Gynecology;  Laterality: N/A;   Left Ovarian Cystectomy  . NOSE SURGERY    . SHOULDER SURGERY     bilateral left x 2, right x 1 surgery  . svd      x 1  . UPPER GASTROINTESTINAL ENDOSCOPY    . WISDOM TOOTH EXTRACTION      Current Outpatient Medications  Medication Sig Dispense Refill  . amphetamine-dextroamphetamine (ADDERALL) 10 MG tablet Take 10 mg  by mouth 2 (two) times daily.  0  . cetirizine (ZYRTEC) 10 MG tablet Take 10 mg by mouth daily.    . Cyanocobalamin (VITAMIN B12 PO) Take 1 each by mouth as needed. gummie     . diclofenac sodium (VOLTAREN) 1 % GEL as needed.  0  . fluticasone (FLONASE) 50 MCG/ACT nasal spray Place 2 sprays into both nostrils daily.  3  . Multiple Vitamins-Minerals (MULTIVITAMIN WITH MINERALS) tablet Take 1 tablet by mouth daily.      Marland Kitchen omeprazole-sodium bicarbonate (ZEGERID) 40-1100 MG per capsule Take 1 capsule by mouth daily before breakfast.      . oxyCODONE-acetaminophen (PERCOCET/ROXICET) 5-325 MG tablet Take 1 tablet by mouth every 4 (four) hours. Prn pain  0   No current facility-administered medications for this visit.     Family History  Problem Relation Age of Onset  . Fibromyalgia Mother   . Thyroid disease Mother   . Seizures Father        d/t head injury  . Cancer Father        ?stomach ca  . Cancer Paternal Grandmother        colon  . Hypertension Paternal Grandmother   . Stroke Paternal Grandmother   .  Diabetes Paternal Aunt   . Hypertension Maternal Grandmother   . Thyroid disease Maternal Grandmother     ROS:  Pertinent items are noted in HPI.  Otherwise, a comprehensive ROS was negative.  Exam:   There were no vitals taken for this visit.    General appearance: alert, cooperative and appears stated age Head: Normocephalic, without obvious abnormality, atraumatic Neck: no adenopathy, supple, symmetrical, trachea midline and thyroid normal to inspection and palpation Lungs: clear to auscultation bilaterally Breasts: normal appearance, no masses or tenderness, No nipple retraction or dimpling, No nipple discharge or bleeding, No axillary or supraclavicular adenopathy Heart: regular rate and rhythm Abdomen: soft, non-tender; no masses, no organomegaly Extremities: extremities normal, atraumatic, no cyanosis or edema Skin: Skin color, texture, turgor normal. No rashes or  lesions Lymph nodes: Cervical, supraclavicular, and axillary nodes normal. No abnormal inguinal nodes palpated Neurologic: Grossly normal  Pelvic: External genitalia:  no lesions              Urethra:  normal appearing urethra with no masses, tenderness or lesions              Bartholins and Skenes: normal                 Vagina: normal appearing vagina with normal color and discharge, no lesions              Cervix: no lesions              Pap taken: {yes no:314532} Bimanual Exam:  Uterus:  normal size, contour, position, consistency, mobility, non-tender              Adnexa: no mass, fullness, tenderness              Rectal exam: {yes no:314532}.  Confirms.              Anus:  normal sphincter tone, no lesions  Chaperone was present for exam.  Assessment:   Well woman visit with normal exam.   Plan: Mammogram screening discussed. Recommended self breast awareness. Pap and HR HPV as above. Guidelines for Calcium, Vitamin D, regular exercise program including cardiovascular and weight bearing exercise.   Follow up annually and prn.   Additional counseling given.  {yes [id number]. _______ minutes face to face time of which over 50% was spent in counseling.    After visit summary provided.

## 2017-08-13 ENCOUNTER — Telehealth: Payer: Self-pay | Admitting: Obstetrics and Gynecology

## 2017-08-13 NOTE — Telephone Encounter (Signed)
Left message regarding upcoming appointment has been canceled and needs to be rescheduled. °

## 2017-08-16 ENCOUNTER — Ambulatory Visit: Payer: 59 | Admitting: Obstetrics and Gynecology

## 2017-08-16 DIAGNOSIS — G8929 Other chronic pain: Secondary | ICD-10-CM | POA: Insufficient documentation

## 2017-08-16 MED FILL — diazePAM 10 MG TABS: 10 | 1 days supply | Qty: 2 | Fill #0

## 2017-09-15 ENCOUNTER — Other Ambulatory Visit (HOSPITAL_COMMUNITY)
Admission: RE | Admit: 2017-09-15 | Discharge: 2017-09-15 | Disposition: A | Discharge status: Home / Self Care | Payer: 59 | Source: Ambulatory Visit | Attending: Obstetrics and Gynecology | Admitting: Obstetrics and Gynecology

## 2017-09-15 ENCOUNTER — Encounter: Payer: Self-pay | Admitting: Obstetrics and Gynecology

## 2017-09-15 ENCOUNTER — Other Ambulatory Visit: Payer: Self-pay

## 2017-09-15 ENCOUNTER — Ambulatory Visit (INDEPENDENT_AMBULATORY_CARE_PROVIDER_SITE_OTHER): Payer: 59 | Admitting: Obstetrics and Gynecology

## 2017-09-15 VITALS — BP 120/70 | HR 84 | Resp 16 | Ht 63.0 in | Wt 124.0 lb

## 2017-09-15 DIAGNOSIS — N898 Other specified noninflammatory disorders of vagina: Secondary | ICD-10-CM | POA: Diagnosis not present

## 2017-09-15 DIAGNOSIS — Z01419 Encounter for gynecological examination (general) (routine) without abnormal findings: Secondary | ICD-10-CM

## 2017-09-15 DIAGNOSIS — R350 Frequency of micturition: Secondary | ICD-10-CM

## 2017-09-15 DIAGNOSIS — Z113 Encounter for screening for infections with a predominantly sexual mode of transmission: Secondary | ICD-10-CM

## 2017-09-15 DIAGNOSIS — Z23 Encounter for immunization: Secondary | ICD-10-CM

## 2017-09-15 DIAGNOSIS — Z1151 Encounter for screening for human papillomavirus (HPV): Secondary | ICD-10-CM | POA: Insufficient documentation

## 2017-09-15 LAB — POCT URINALYSIS DIPSTICK
Appearance: NORMAL
Bilirubin, UA: NEGATIVE
Blood, UA: NEGATIVE
Glucose, UA: NEGATIVE
Ketones, UA: NEGATIVE
Leukocytes, UA: NEGATIVE
Nitrite, UA: NEGATIVE
Protein, UA: NEGATIVE
Urobilinogen, UA: 0.2 E.U./dL
pH, UA: 7 (ref 5.0–8.0)

## 2017-09-15 MED ORDER — NORETHINDRONE 0.35 MG PO TABS: 1.0000 | ORAL_TABLET | Freq: Every day | ORAL | 2 refills | Status: DC

## 2017-09-15 MED FILL — NORETHINDRONE 0.35 MG TAB: 0.35 | 28 days supply | Qty: 28 | Fill #0

## 2017-09-15 NOTE — Progress Notes (Signed)
47 y.o. G55P1001 partnered Caucasian female here for annual exam.  Patient complains of having urinary frequency. Urine dipstick was negative  Has uterovaginal prolapse.  Patient is concerned about recurrence of prolapse.   Irregular menses.  Saw Dr. Talbert Nan for really having vaginal bleeding "like a flood" for 45 minutes and then lasted for days.  This occurred after she ran very quickly.  Worried that she tore her pelvic organ prolapse repair graft. Normal pelvic US on 04/06/17. Normal TSH 04/02/17.  Hx endometrial ablation.  Feels like she is bloating.  Has menstrual headache.  Every 2 weeks has some light bleeding.  Dysmenorrhea also.  Also having some rectal pain every few months.  It is intolerable.  Wakes her up from sleep.  States her mother has the same type of pain.  Decreased energy.  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  Wants serum STD screening as she is exposed to blood and body fluids at work.  PCP:   Carol Ada, MD  Patient's last menstrual period was 09/14/2017.           Sexually active: Yes.   Female partner. The current method of family planning is Novasure Ablation.    Exercising: Yes.    weights and cardio Smoker:  no  Health Maintenance: Pap:  04-24-15 Neg:Neg HR HPV History of abnormal Pap:  no MMG:  04/07/16 BIRADS0, D[facility]; 04/16/16 Dx & U/S L Breast; 04/21/16 L Breast Biopsy  Colonoscopy:  n/a BMD:   n/a  Result  n/a TDaP:  unsure Gardasil:   n/a HIV: years ago Hep C: years ago Screening Labs:  Discuss today   reports that  has never smoked. she has never used smokeless tobacco. She reports that she drinks alcohol. She reports that she does not use drugs.  Past Medical History:  Diagnosis Date  . GERD (gastroesophageal reflux disease)   . Headache(784.0)    otc meds prn  . Hyperthyroidism    no meds  . Irregular heart beat    history - no treatment  Dr Wynonia Lawman  . PONV (postoperative nausea and vomiting)   . Right  ovarian cyst 2016   CT scan - 1 cm, possible dermoid.  . Sinus tachycardia   . Urinary incontinence    urgency and night urination    Past Surgical History:  Procedure Laterality Date  . ANTERIOR AND POSTERIOR REPAIR     with biological grafts  . BREAST SURGERY  04/07/2016   Lt.Br.Bx--Fibroadenoma  . DIAGNOSTIC LAPAROSCOPY    . DILATION AND CURETTAGE OF UTERUS    . ENDOMETRIAL ABLATION  05/2011  . INCONTINENCE SURGERY     [facility] midurethral sling and cystoscopy  . LAPAROSCOPY  06/12/2011   Procedure: LAPAROSCOPY OPERATIVE;  Surgeon: Arloa Koh;  Location: [facility];  Service: Gynecology;  Laterality: N/A;   Left Ovarian Cystectomy  . NOSE SURGERY    . SHOULDER SURGERY     bilateral left x 2, right x 1 surgery  . svd      x 1  . UPPER GASTROINTESTINAL ENDOSCOPY    . WISDOM TOOTH EXTRACTION      Current Outpatient Medications  Medication Sig Dispense Refill  . amphetamine-dextroamphetamine (ADDERALL) 10 MG tablet Take 10 mg by mouth 2 (two) times daily.  0  . cetirizine (ZYRTEC) 10 MG tablet Take 10 mg by mouth daily.    . Cyanocobalamin (VITAMIN B12 PO) Take 1 each by mouth as needed. gummie     .  cyclobenzaprine (FLEXERIL) 10 MG tablet Take 10 mg by mouth as needed.  0  . diclofenac sodium (VOLTAREN) 1 % GEL as needed.  0  . fluticasone (FLONASE) 50 MCG/ACT nasal spray Place 2 sprays into both nostrils daily.  3  . Multiple Vitamins-Minerals (MULTIVITAMIN WITH MINERALS) tablet Take 1 tablet by mouth daily.      Marland Kitchen omeprazole-sodium bicarbonate (ZEGERID) 40-1100 MG per capsule Take 1 capsule by mouth daily before breakfast.      . oxyCODONE-acetaminophen (PERCOCET/ROXICET) 5-325 MG tablet Take 1 tablet by mouth every 4 (four) hours. Prn pain  0  . norethindrone (MICRONOR,CAMILA,ERRIN) 0.35 MG tablet Take 1 tablet (0.35 mg total) by mouth daily. 1 Package 2   No current facility-administered medications for this visit.     Family History  Problem Relation Age of Onset  .  Fibromyalgia Mother   . Thyroid disease Mother   . Seizures Father        d/t head injury  . Cancer Father        ?stomach ca  . Cancer Paternal Grandmother        colon  . Hypertension Paternal Grandmother   . Stroke Paternal Grandmother   . Diabetes Paternal Aunt   . Hypertension Maternal Grandmother   . Thyroid disease Maternal Grandmother     ROS:  Pertinent items are noted in HPI.  Otherwise, a comprehensive ROS was negative.  Exam:   BP 120/70 (BP Location: Right Arm, Patient Position: Sitting, Cuff Size: Normal)   Pulse 84   Resp 16   Ht 5\' 3"  (1.6 m)   Wt 124 lb (56.2 kg)   LMP 09/14/2017   BMI 21.97 kg/m     General appearance: alert, cooperative and appears stated age Head: Normocephalic, without obvious abnormality, atraumatic Neck: no adenopathy, supple, symmetrical, trachea midline and thyroid normal to inspection and palpation Lungs: clear to auscultation bilaterally Breasts: normal appearance, no masses or tenderness, No nipple retraction or dimpling, No nipple discharge or bleeding, No axillary or supraclavicular adenopathy Heart: regular rate and rhythm Abdomen: soft, non-tender; no masses, no organomegaly Extremities: extremities normal, atraumatic, no cyanosis or edema Skin: Skin color, texture, turgor normal. No rashes or lesions Lymph nodes: Cervical, supraclavicular, and axillary nodes normal. No abnormal inguinal nodes palpated Neurologic: Grossly normal  Pelvic: External genitalia:  no lesions              Urethra:  normal appearing urethra with no masses, tenderness or lesions              Bartholins and Skenes: normal                 Vagina: normal appearing vagina with normal color and discharge, no lesions              Cervix: no lesions              Pap taken: Yes.   Bimanual Exam:  Uterus:  normal size, contour, position, consistency, mobility, non-tender              Adnexa: no mass, fullness, tenderness              Rectal exam: Yes.  .   Confirms.              Anus:  normal sphincter tone,  8 mm rectovaginal cyst.   Chaperone was present for exam.  Assessment:   Well woman visit with normal exam. Overactive bladder. Off medicaiton Status  post A and P repair with biological grafts and midurethral sling Irregular menses.  Dysmenorrhea.  Fatigue.  Hx hyperthyroidism and no tx. Hx fibrocystic breasts.  Rectal pain.  Rectovaginal septal cyst. I believe this is from her prior prolapse surgery. FH colon cancer.   Plan: Mammogram screening discussed.  We will schedule at [facility] for her.  Recommended self breast awareness. Pap and HR HPV as above. Guidelines for Calcium, Vitamin D, regular exercise program including cardiovascular and weight bearing exercise. Start Micronor.  Instructed in use. Routine labs, serum STD testing.  TDap.  To Dr. Watt Climes for colonoscopy.  She already has an appointment for a routine recheck with him.  She will discuss this.   Recheck in 3 months for FU of Micronor and rectovaginal cyst.  Follow up annually and prn.   After visit summary provided.

## 2017-09-15 NOTE — Patient Instructions (Signed)
EXERCISE AND DIET:  We recommended that you start or continue a regular exercise program for good health. Regular exercise means any activity that makes your heart beat faster and makes you sweat.  We recommend exercising at least 30 minutes per day at least 3 days a week, preferably 4 or 5.  We also recommend a diet low in fat and sugar.  Inactivity, poor dietary choices and obesity can cause diabetes, heart attack, stroke, and kidney damage, among others.    ALCOHOL AND SMOKING:  Women should limit their alcohol intake to no more than 7 drinks/beers/glasses of wine (combined, not each!) per week. Moderation of alcohol intake to this level decreases your risk of breast cancer and liver damage. And of course, no recreational drugs are part of a healthy lifestyle.  And absolutely no smoking or even second hand smoke. Most people know smoking can cause heart and lung diseases, but did you know it also contributes to weakening of your bones? Aging of your skin?  Yellowing of your teeth and nails?  CALCIUM AND VITAMIN D:  Adequate intake of calcium and Vitamin D are recommended.  The recommendations for exact amounts of these supplements seem to change often, but generally speaking 600 mg of calcium (either carbonate or citrate) and 800 units of Vitamin D per day seems prudent. Certain women may benefit from higher intake of Vitamin D.  If you are among these women, your doctor will have told you during your visit.    PAP SMEARS:  Pap smears, to check for cervical cancer or precancers,  have traditionally been done yearly, although recent scientific advances have shown that most women can have pap smears less often.  However, every woman still should have a physical exam from her gynecologist every year. It will include a breast check, inspection of the vulva and vagina to check for abnormal growths or skin changes, a visual exam of the cervix, and then an exam to evaluate the size and shape of the uterus and  ovaries.  And after 49 years of age, a rectal exam is indicated to check for rectal cancers. We will also provide age appropriate advice regarding health maintenance, like when you should have certain vaccines, screening for sexually transmitted diseases, bone density testing, colonoscopy, mammograms, etc.   MAMMOGRAMS:  All women over 40 years old should have a yearly mammogram. Many facilities now offer a "3D" mammogram, which may cost around $50 extra out of pocket. If possible,  we recommend you accept the option to have the 3D mammogram performed.  It both reduces the number of women who will be called back for extra views which then turn out to be normal, and it is better than the routine mammogram at detecting truly abnormal areas.    COLONOSCOPY:  Colonoscopy to screen for colon cancer is recommended for all women at age 50.  We know, you hate the idea of the prep.  We agree, BUT, having colon cancer and not knowing it is worse!!  Colon cancer so often starts as a polyp that can be seen and removed at colonscopy, which can quite literally save your life!  And if your first colonoscopy is normal and you have no family history of colon cancer, most women don't have to have it again for 10 years.  Once every ten years, you can do something that may end up saving your life, right?  We will be happy to help you get it scheduled when you are ready.    Be sure to check your insurance coverage so you understand how much it will cost.  It may be covered as a preventative service at no cost, but you should check your particular policy.     Norethindrone tablets (contraception) What is this medicine? NORETHINDRONE (nor eth IN drone) is an oral contraceptive. The product contains a female hormone known as a progestin. It is used to prevent pregnancy. This medicine may be used for other purposes; ask your health care provider or pharmacist if you have questions. COMMON BRAND NAME(S): Camila, Deblitane 28-Day,  Errin, Heather, [place], Jolivette, Donna, Nor-QD, Nora-BE, Norlyroc, Ortho Micronor, [employer] 28-Day What should I tell my health care provider before I take this medicine? They need to know if you have any of these conditions: -blood vessel disease or blood clots -breast, cervical, or vaginal cancer -diabetes -heart disease -kidney disease -liver disease -mental depression -migraine -seizures -stroke -vaginal bleeding -an unusual or allergic reaction to norethindrone, other medicines, foods, dyes, or preservatives -pregnant or trying to get pregnant -breast-feeding How should I use this medicine? Take this medicine by mouth with a glass of water. You may take it with or without food. Follow the directions on the prescription label. Take this medicine at the same time each day and in the order directed on the package. Do not take your medicine more often than directed. Contact your pediatrician regarding the use of this medicine in children. Special care may be needed. This medicine has been used in female children who have started having menstrual periods. A patient package insert for the product will be given with each prescription and refill. Read this sheet carefully each time. The sheet may change frequently. Overdosage: If you think you have taken too much of this medicine contact a poison control center or emergency room at once. NOTE: This medicine is only for you. Do not share this medicine with others. What if I miss a dose? Try not to miss a dose. Every time you miss a dose or take a dose late your chance of pregnancy increases. When 1 pill is missed (even if only 3 hours late), take the missed pill as soon as possible and continue taking a pill each day at the regular time (use a back up method of birth control for the next 48 hours). If more than 1 dose is missed, use an additional birth control method for the rest of your pill pack until menses occurs. Contact your health care  professional if more than 1 dose has been missed. What may interact with this medicine? Do not take this medicine with any of the following medications: -amprenavir or fosamprenavir -bosentan This medicine may also interact with the following medications: -antibiotics or medicines for infections, especially rifampin, rifabutin, rifapentine, and griseofulvin, and possibly penicillins or tetracyclines -aprepitant -barbiturate medicines, such as phenobarbital -carbamazepine -felbamate -modafinil -oxcarbazepine -phenytoin -ritonavir or other medicines for HIV infection or AIDS -St. John's wort -topiramate This list may not describe all possible interactions. Give your health care provider a list of all the medicines, herbs, non-prescription drugs, or dietary supplements you use. Also tell them if you smoke, drink alcohol, or use illegal drugs. Some items may interact with your medicine. What should I watch for while using this medicine? Visit your doctor or health care professional for regular checks on your progress. You will need a regular breast and pelvic exam and Pap smear while on this medicine. Use an additional method of birth control during the first cycle that  you take these tablets. If you have any reason to think you are pregnant, stop taking this medicine right away and contact your doctor or health care professional. If you are taking this medicine for hormone related problems, it may take several cycles of use to see improvement in your condition. This medicine does not protect you against HIV infection (AIDS) or any other sexually transmitted diseases. What side effects may I notice from receiving this medicine? Side effects that you should report to your doctor or health care professional as soon as possible: -breast tenderness or discharge -pain in the abdomen, chest, groin or leg -severe headache -skin rash, itching, or hives -sudden shortness of breath -unusually weak  or tired -vision or speech problems -yellowing of skin or eyes Side effects that usually do not require medical attention (report to your doctor or health care professional if they continue or are bothersome): -changes in sexual desire -change in menstrual flow -facial hair growth -fluid retention and swelling -headache -irritability -nausea -weight gain or loss This list may not describe all possible side effects. Call your doctor for medical advice about side effects. You may report side effects to FDA at 1-800-FDA-1088. Where should I keep my medicine? Keep out of the reach of children. Store at room temperature between 15 and 30 degrees C (59 and 86 degrees F). Throw away any unused medicine after the expiration date. NOTE: This sheet is a summary. It may not cover all possible information. If you have questions about this medicine, talk to your doctor, pharmacist, or health care provider.  2018 Elsevier/Gold Standard (2012-04-01 16:41:35)

## 2017-09-16 LAB — COMPREHENSIVE METABOLIC PANEL
ALT: 14 IU/L (ref 0–32)
AST: 12 IU/L (ref 0–40)
Albumin/Globulin Ratio: 2 (ref 1.2–2.2)
Albumin: 4.7 g/dL (ref 3.5–5.5)
Alkaline Phosphatase: 55 IU/L (ref 39–117)
BUN/Creatinine Ratio: 18 (ref 9–23)
BUN: 12 mg/dL (ref 6–24)
Bilirubin Total: 0.3 mg/dL (ref 0.0–1.2)
CO2: 23 mmol/L (ref 20–29)
Calcium: 9.5 mg/dL (ref 8.7–10.2)
Chloride: 105 mmol/L (ref 96–106)
Creatinine, Ser: 0.66 mg/dL (ref 0.57–1.00)
GFR calc Af Amer: 121 mL/min/{1.73_m2} (ref >59)
GFR calc non Af Amer: 105 mL/min/{1.73_m2} (ref >59)
Globulin, Total: 2.4 g/dL (ref 1.5–4.5)
Glucose: 95 mg/dL (ref 65–99)
Potassium: 4.5 mmol/L (ref 3.5–5.2)
Sodium: 143 mmol/L (ref 134–144)
Total Protein: 7.1 g/dL (ref 6.0–8.5)

## 2017-09-16 LAB — LIPID PANEL
Chol/HDL Ratio: 3.4 ratio (ref 0.0–4.4)
Cholesterol, Total: 203 mg/dL — ABNORMAL HIGH (ref 100–199)
HDL: 60 mg/dL (ref >39)
LDL Calculated: 124 mg/dL — ABNORMAL HIGH (ref 0–99)
Triglycerides: 95 mg/dL (ref 0–149)
VLDL Cholesterol Cal: 19 mg/dL (ref 5–40)

## 2017-09-16 LAB — CBC
Hematocrit: 38 % (ref 34.0–46.6)
Hemoglobin: 13.4 g/dL (ref 11.1–15.9)
MCH: 31.7 pg (ref 26.6–33.0)
MCHC: 35.3 g/dL (ref 31.5–35.7)
MCV: 90 fL (ref 79–97)
Platelets: 399 10*3/uL — ABNORMAL HIGH (ref 150–379)
RBC: 4.23 x10E6/uL (ref 3.77–5.28)
RDW: 14.2 % (ref 12.3–15.4)
WBC: 6.7 10*3/uL (ref 3.4–10.8)

## 2017-09-16 LAB — VITAMIN D 25 HYDROXY (VIT D DEFICIENCY, FRACTURES): Vit D, 25-Hydroxy: 30.7 ng/mL (ref 30.0–100.0)

## 2017-09-16 LAB — HEP, RPR, HIV PANEL
HIV Screen 4th Generation wRfx: NONREACTIVE
Hepatitis B Surface Ag: NEGATIVE
RPR Ser Ql: NONREACTIVE

## 2017-09-16 LAB — HEPATITIS C ANTIBODY: Hep C Virus Ab: <0.1 s/co ratio (ref 0.0–0.9)

## 2017-09-17 ENCOUNTER — Encounter: Payer: Self-pay | Admitting: Obstetrics and Gynecology

## 2017-09-17 DIAGNOSIS — N898 Other specified noninflammatory disorders of vagina: Secondary | ICD-10-CM | POA: Insufficient documentation

## 2017-09-17 LAB — CYTOLOGY - PAP
Diagnosis: NEGATIVE
HPV: NOT DETECTED

## 2017-09-18 ENCOUNTER — Encounter: Payer: Self-pay | Admitting: Obstetrics and Gynecology

## 2017-09-28 ENCOUNTER — Telehealth: Payer: Self-pay | Admitting: Obstetrics and Gynecology

## 2017-09-28 NOTE — Telephone Encounter (Signed)
Patient is still having arm pain after receiving tdap 09/15/17.

## 2017-09-28 NOTE — Telephone Encounter (Signed)
Spoke with patient. Received tdap 09/15/17 in right upper arm, reports continued deep pain since injection and pain with movement. Denies fever, redness, swelling or warmth at site.   Patient has taken Advil, applied Biofreeze, ice, heat and voltaren gel. States can move arm only after taking advil. Hx of shoulder surgeries. Is a Critical Care nurse.   Recommended OV for further evaluation, patient initially declined stating there is nothing to see, "just want to know what else I can do or put on it". Again, recommended OV for further evaluation, OV scheduled for 3/7 at 12:30pm with Dr. Quincy Simmonds, patient declined earlier appointments d/t work schedule. Advised should she be available for earlier appt, return call to office. Advised Dr. Quincy Simmonds is out of the office, will review with covering provider and return call any additional recommendations, patient is agreeable.   Routing to covering provider for final review. Patient is agreeable to disposition. Will close encounter.  Cc: Dr. Quincy Simmonds

## 2017-09-30 ENCOUNTER — Ambulatory Visit (INDEPENDENT_AMBULATORY_CARE_PROVIDER_SITE_OTHER): Payer: 59 | Admitting: Obstetrics and Gynecology

## 2017-09-30 ENCOUNTER — Encounter: Payer: Self-pay | Admitting: Obstetrics and Gynecology

## 2017-09-30 ENCOUNTER — Other Ambulatory Visit: Payer: Self-pay

## 2017-09-30 VITALS — BP 92/50 | HR 72 | Resp 14 | Wt 125.5 lb

## 2017-09-30 DIAGNOSIS — R52 Pain, unspecified: Secondary | ICD-10-CM

## 2017-09-30 DIAGNOSIS — T8089XA Other complications following infusion, transfusion and therapeutic injection, initial encounter: Secondary | ICD-10-CM

## 2017-09-30 NOTE — Progress Notes (Signed)
GYNECOLOGY  VISIT   HPI: 47 y.o.   Single  Caucasian  female   [id number] with Patient's last menstrual period was 09/14/2017.   here for   Deep muscle and joint pain following injection.  Some decreased mobility but able to have range of motion.   Voltaren gel, NSAIDS, and Lidocaine helps to improve the pain.   No redness or fever.  Had soreness in right shoulder prior to injection.  Hx bilateral shoulder surgery.  GYNECOLOGIC HISTORY: Patient's last menstrual period was 09/14/2017. Contraception:  Novasure Ablation Menopausal hormone therapy:  none Last mammogram:  04/07/16 BIRADS0, Density C[facility]; 04/16/16 Dx & U/S L Breast; 04/21/16 L Breast Biopsy Last pap smear:   09/15/17 Pap and HR HPV negative        OB History    Gravida Para Term Preterm AB Living   1 1 1     1    SAB TAB Ectopic Multiple Live Births                     Patient Active Problem List   Diagnosis Date Noted  . Vaginal cyst 09/17/2017    Past Medical History:  Diagnosis Date  . GERD (gastroesophageal reflux disease)   . Headache(784.0)    otc meds prn  . Hyperthyroidism    no meds  . Irregular heart beat    history - no treatment  Dr Wynonia Lawman  . PONV (postoperative nausea and vomiting)   . Right ovarian cyst 2016   CT scan - 1 cm, possible dermoid.  . Sinus tachycardia   . Thrombocytosis ([place])   . Urinary incontinence    urgency and night urination    Past Surgical History:  Procedure Laterality Date  . ANTERIOR AND POSTERIOR REPAIR     with biological grafts  . BREAST SURGERY  04/07/2016   Lt.Br.Bx--Fibroadenoma  . DIAGNOSTIC LAPAROSCOPY    . DILATION AND CURETTAGE OF UTERUS    . ENDOMETRIAL ABLATION  05/2011  . INCONTINENCE SURGERY     [facility] midurethral sling and cystoscopy  . LAPAROSCOPY  06/12/2011   Procedure: LAPAROSCOPY OPERATIVE;  Surgeon: Arloa Koh;  Location: [facility];  Service: Gynecology;  Laterality: N/A;   Left Ovarian Cystectomy  . NOSE SURGERY    . SHOULDER  SURGERY     bilateral left x 2, right x 1 surgery  . svd      x 1  . UPPER GASTROINTESTINAL ENDOSCOPY    . WISDOM TOOTH EXTRACTION      Current Outpatient Medications  Medication Sig Dispense Refill  . amphetamine-dextroamphetamine (ADDERALL) 10 MG tablet Take 10 mg by mouth 2 (two) times daily.  0  . cetirizine (ZYRTEC) 10 MG tablet Take 10 mg by mouth daily.    . Cyanocobalamin (VITAMIN B12 PO) Take 1 each by mouth as needed. gummie     . cyclobenzaprine (FLEXERIL) 10 MG tablet Take 10 mg by mouth as needed.  0  . diclofenac sodium (VOLTAREN) 1 % GEL as needed.  0  . fluticasone (FLONASE) 50 MCG/ACT nasal spray Place 2 sprays into both nostrils daily.  3  . Multiple Vitamins-Minerals (MULTIVITAMIN WITH MINERALS) tablet Take 1 tablet by mouth daily.      . norethindrone (MICRONOR,CAMILA,ERRIN) 0.35 MG tablet Take 1 tablet (0.35 mg total) by mouth daily. 1 Package 2  . omeprazole-sodium bicarbonate (ZEGERID) 40-1100 MG per capsule Take 1 capsule by mouth daily before breakfast.      .  oxyCODONE-acetaminophen (PERCOCET/ROXICET) 5-325 MG tablet Take 1 tablet by mouth every 4 (four) hours. Prn pain  0   No current facility-administered medications for this visit.      ALLERGIES: Gadolinium derivatives; Augmentin [amoxicillin-pot clavulanate]; Buprenorphine hcl; Codeine; Iohexol; Morphine and related; Vicodin [hydrocodone-acetaminophen]; and Lisdexamfetamine  Family History  Problem Relation Age of Onset  . Fibromyalgia Mother   . Thyroid disease Mother   . Seizures Father        d/t head injury  . Cancer Father        ?stomach ca  . Cancer Paternal Grandmother        colon  . Hypertension Paternal Grandmother   . Stroke Paternal Grandmother   . Diabetes Paternal Aunt   . Hypertension Maternal Grandmother   . Thyroid disease Maternal Grandmother     Social History   Socioeconomic History  . Marital status: Single    Spouse name: Not on file  . Number of children: Not on  file  . Years of education: Not on file  . Highest education level: Not on file  Social Needs  . Financial resource strain: Not on file  . Food insecurity - worry: Not on file  . Food insecurity - inability: Not on file  . Transportation needs - medical: Not on file  . Transportation needs - non-medical: Not on file  Occupational History  . Not on file  Tobacco Use  . Smoking status: Never Smoker  . Smokeless tobacco: Never Used  Substance and Sexual Activity  . Alcohol use: Yes    Alcohol/week: 0.0 oz    Comment: only occasionally  . Drug use: No  . Sexual activity: Yes    Partners: Female    Comment: ablation-Novasure  Other Topics Concern  . Not on file  Social History Narrative  . Not on file    ROS:  Pertinent items are noted in HPI.  PHYSICAL EXAMINATION:    BP (!) 92/50 (BP Location: Left Arm, Patient Position: Sitting, Cuff Size: Normal)   Pulse 72   Resp 14   Wt 125 lb 8 oz (56.9 kg)   LMP 09/14/2017   BMI 22.23 kg/m     General appearance: alert, cooperative and appears stated age   Extremities: right upper extremities without erythema, mass, tenderness to palpation.  Good range of motion.    ASSESSMENT  Pain at injection site of TDap.  No muscle wasting.    PLAN  Continue support measures.  I will refer her to orthopedist if pain continues. Adverse reaction form filled out.    An After Visit Summary was printed and given to the patient.

## 2017-10-01 DIAGNOSIS — R52 Pain, unspecified: Secondary | ICD-10-CM | POA: Insufficient documentation

## 2017-10-01 DIAGNOSIS — T8089XA Other complications following infusion, transfusion and therapeutic injection, initial encounter: Secondary | ICD-10-CM

## 2017-10-06 DIAGNOSIS — F902 Attention-deficit hyperactivity disorder, combined type: Secondary | ICD-10-CM | POA: Diagnosis not present

## 2017-10-19 MED FILL — FLUTICASONE PROP 50 MCG SPR: 50 | 90 days supply | Qty: 48 | Fill #1

## 2017-10-19 MED FILL — OMEPRAZOLE 20 MG CAP: 20 | 90 days supply | Qty: 90 | Fill #0

## 2017-10-26 MED FILL — AMPHETAMINE-DEXTROAMPHETAMI: 10 | 30 days supply | Qty: 60 | Fill #0

## 2017-11-13 DIAGNOSIS — J019 Acute sinusitis, unspecified: Secondary | ICD-10-CM | POA: Diagnosis not present

## 2017-11-13 DIAGNOSIS — J029 Acute pharyngitis, unspecified: Secondary | ICD-10-CM | POA: Diagnosis not present

## 2017-12-06 ENCOUNTER — Telehealth: Payer: Self-pay | Admitting: Obstetrics and Gynecology

## 2017-12-06 NOTE — Telephone Encounter (Signed)
Will forward this message to triage to contact the patient back. I also was doing a recheck for a new finding of a rectovaginal cyst I noted on her last exam.  Ok of she is not taking the medication, but I do recommend a physical recheck.   [place]

## 2017-12-06 NOTE — Telephone Encounter (Signed)
Spoke with patient, advised as seen below per Dr. Quincy Simmonds. OV rescheduled for 12/15/17 at 10:30am with Dr. Quincy Simmonds. Patient verbalizes understanding.   Routing to provider for final review. Patient is agreeable to disposition. Will close encounter.

## 2017-12-06 NOTE — Telephone Encounter (Signed)
Patient cancelled follow up appointment because she is not taking medication. Will call back to reschedule

## 2017-12-13 ENCOUNTER — Ambulatory Visit: Payer: 59 | Admitting: Obstetrics and Gynecology

## 2017-12-15 ENCOUNTER — Other Ambulatory Visit: Payer: Self-pay

## 2017-12-15 ENCOUNTER — Ambulatory Visit: Payer: 59 | Admitting: Obstetrics and Gynecology

## 2017-12-15 ENCOUNTER — Encounter: Payer: Self-pay | Admitting: Obstetrics and Gynecology

## 2017-12-15 ENCOUNTER — Ambulatory Visit (INDEPENDENT_AMBULATORY_CARE_PROVIDER_SITE_OTHER): Payer: 59 | Admitting: Obstetrics and Gynecology

## 2017-12-15 VITALS — BP 112/78 | HR 84 | Resp 14 | Ht 63.0 in | Wt 127.0 lb

## 2017-12-15 DIAGNOSIS — N899 Noninflammatory disorder of vagina, unspecified: Secondary | ICD-10-CM

## 2017-12-15 DIAGNOSIS — K6289 Other specified diseases of anus and rectum: Secondary | ICD-10-CM | POA: Insufficient documentation

## 2017-12-15 DIAGNOSIS — Z800 Family history of malignant neoplasm of digestive organs: Secondary | ICD-10-CM | POA: Diagnosis not present

## 2017-12-15 DIAGNOSIS — N946 Dysmenorrhea, unspecified: Secondary | ICD-10-CM

## 2017-12-15 DIAGNOSIS — N898 Other specified noninflammatory disorders of vagina: Secondary | ICD-10-CM

## 2017-12-15 MED ORDER — NORETHIN ACE-ETH ESTRAD-FE 1-20 MG-MCG PO TABS: 1.0000 | ORAL_TABLET | Freq: Every day | ORAL | 2 refills | Status: DC

## 2017-12-15 NOTE — Progress Notes (Signed)
GYNECOLOGY  VISIT   HPI: 47 y.o.   Single  Caucasian  female   [id number] with No LMP recorded. Patient has had an ablation.   here for recheck for rectovaginal cyst.  Had routine annual exam with GYN exam on 09/15/17 and was noted to have a rectovaginal cystic mass then.  Did not continue Micronor due to difficulty with taking the medication at a scheduled time.  Was prescribed this of abdominal bloating, painful menses and light bleeding every 2 weeks.  Hx endometrial ablation.  Normal pelvic US on 04/06/17. May be interested in trying LoEstrin.   No HTN, smoker, liver disease, migraine with aura, or hx of DVT/PE.  Does have rectal pain that can wake her up and cause a vagal type response.  Less pain if she is not constipated.   GYNECOLOGIC HISTORY: No LMP recorded. Patient has had an ablation. Contraception:  Novasure Ablation Menopausal hormone therapy:  none Last mammogram:  04/07/16 BIRADS0, Density C, [facility]; 04/16/16 Dx & U/S L Breast; 04/21/16 L Breast Biopsy.  Recommendation for return to routine screening.  Last pap smear:    09/15/17 Pap and HR HPV negative        OB History    Gravida  1   Para  1   Term  1   Preterm      AB      Living  1     SAB      TAB      Ectopic      Multiple      Live Births                 Patient Active Problem List   Diagnosis Date Noted  . Pain at injection site 10/01/2017  . Vaginal cyst 09/17/2017    Past Medical History:  Diagnosis Date  . GERD (gastroesophageal reflux disease)   . Headache(784.0)    otc meds prn  . Hyperthyroidism    no meds  . Irregular heart beat    history - no treatment  Dr Wynonia Lawman  . PONV (postoperative nausea and vomiting)   . Right ovarian cyst 2016   CT scan - 1 cm, possible dermoid.  . Sinus tachycardia   . Thrombocytosis ([place])   . Urinary incontinence    urgency and night urination  . Vaginal cyst 2019   posterior vaginal wall - 8 mm    Past Surgical History:   Procedure Laterality Date  . ANTERIOR AND POSTERIOR REPAIR     with biological grafts  . BREAST SURGERY  04/07/2016   Lt.Br.Bx--Fibroadenoma  . DIAGNOSTIC LAPAROSCOPY    . DILATION AND CURETTAGE OF UTERUS    . ENDOMETRIAL ABLATION  05/2011  . INCONTINENCE SURGERY     [facility] midurethral sling and cystoscopy  . LAPAROSCOPY  06/12/2011   Procedure: LAPAROSCOPY OPERATIVE;  Surgeon: Arloa Koh;  Location: [facility];  Service: Gynecology;  Laterality: N/A;   Left Ovarian Cystectomy  . NOSE SURGERY    . SHOULDER SURGERY     bilateral left x 2, right x 1 surgery  . svd      x 1  . UPPER GASTROINTESTINAL ENDOSCOPY    . WISDOM TOOTH EXTRACTION      Current Outpatient Medications  Medication Sig Dispense Refill  . amphetamine-dextroamphetamine (ADDERALL) 10 MG tablet Take 10 mg by mouth 2 (two) times daily.  0  . cetirizine (ZYRTEC) 10 MG tablet Take 10 mg by mouth daily.    Marland Kitchen  Cyanocobalamin (VITAMIN B12 PO) Take 1 each by mouth as needed. gummie     . cyclobenzaprine (FLEXERIL) 10 MG tablet Take 10 mg by mouth as needed.  0  . diclofenac sodium (VOLTAREN) 1 % GEL as needed.  0  . fluticasone (FLONASE) 50 MCG/ACT nasal spray Place 2 sprays into both nostrils daily.  3  . Multiple Vitamins-Minerals (MULTIVITAMIN WITH MINERALS) tablet Take 1 tablet by mouth daily.      Marland Kitchen omeprazole-sodium bicarbonate (ZEGERID) 40-1100 MG per capsule Take 1 capsule by mouth daily before breakfast.      . oxyCODONE-acetaminophen (PERCOCET/ROXICET) 5-325 MG tablet Take 1 tablet by mouth every 4 (four) hours. Prn pain  0  . norethindrone (MICRONOR,CAMILA,ERRIN) 0.35 MG tablet Take 1 tablet (0.35 mg total) by mouth daily. (Patient not taking: Reported on 12/15/2017) 1 Package 2   No current facility-administered medications for this visit.      ALLERGIES: Gadolinium derivatives; Augmentin [amoxicillin-pot clavulanate]; Buprenorphine hcl; Codeine; Iohexol; Morphine and related; Vicodin [hydrocodone-acetaminophen];  and Lisdexamfetamine  Family History  Problem Relation Age of Onset  . Fibromyalgia Mother   . Thyroid disease Mother   . Seizures Father        d/t head injury  . Cancer Father        ?stomach ca  . Cancer Paternal Grandmother        colon  . Hypertension Paternal Grandmother   . Stroke Paternal Grandmother   . Diabetes Paternal Aunt   . Hypertension Maternal Grandmother   . Thyroid disease Maternal Grandmother     Social History   Socioeconomic History  . Marital status: Single    Spouse name: Not on file  . Number of children: Not on file  . Years of education: Not on file  . Highest education level: Not on file  Occupational History  . Not on file  Social Needs  . Financial resource strain: Not on file  . Food insecurity:    Worry: Not on file    Inability: Not on file  . Transportation needs:    Medical: Not on file    Non-medical: Not on file  Tobacco Use  . Smoking status: Never Smoker  . Smokeless tobacco: Never Used  Substance and Sexual Activity  . Alcohol use: Yes    Alcohol/week: 0.0 oz    Comment: only occasionally  . Drug use: No  . Sexual activity: Yes    Partners: Female    Comment: ablation-Novasure  Lifestyle  . Physical activity:    Days per week: Not on file    Minutes per session: Not on file  . Stress: Not on file  Relationships  . Social connections:    Talks on phone: Not on file    Gets together: Not on file    Attends religious service: Not on file    Active member of club or organization: Not on file    Attends meetings of clubs or organizations: Not on file    Relationship status: Not on file  . Intimate partner violence:    Fear of current or ex partner: Not on file    Emotionally abused: Not on file    Physically abused: Not on file    Forced sexual activity: Not on file  Other Topics Concern  . Not on file  Social History Narrative  . Not on file    Review of Systems  Constitutional: Negative.   HENT: Negative.    Eyes: Negative.   Respiratory:  Negative.   Cardiovascular: Negative.   Gastrointestinal: Negative.   Endocrine: Negative.   Genitourinary: Negative.   Musculoskeletal: Negative.   Skin: Negative.   Allergic/Immunologic: Negative.   Neurological: Negative.   Hematological: Negative.   Psychiatric/Behavioral: Negative.     PHYSICAL EXAMINATION:    BP 112/78 (BP Location: Right Arm, Patient Position: Sitting, Cuff Size: Normal)   Pulse 84   Resp 14   Ht 5\' 3"  (1.6 m)   Wt 127 lb (57.6 kg)   BMI 22.50 kg/m     General appearance: alert, cooperative and appears stated age   Pelvic: External genitalia:  no lesions              Urethra:  normal appearing urethra with no masses, tenderness or lesions              Bartholins and Skenes: normal                 Vagina: normal appearing vagina with normal color and discharge.  8 mm rectovaginal cyst, mobile, slightly tender with palpation.               Cervix: no lesions                Bimanual Exam:  Uterus:  normal size, contour, position, consistency, mobility, non-tender              Adnexa: no mass, fullness, tenderness              Rectal exam: Yes.  .  Confirms.              Anus:  normal sphincter tone, no lesions  Chaperone was present for exam.  ASSESSMENT  Rectovaginal mass.  Feels like a cyst and is not increasing in size.  Rectal pain.  FH of colon and GI cancer. Abdominal bloating, dysmenorrhea, and irregular bleeding. Hx left breast fibroadenoma.   PLAN  Ok for trial of LoEstrin 1/20 x 3 months.  She will update her mammogram.  Will make referral to Dr. Watt Climes for colonoscopy.  I think that the cyst of the vagina is stable and not something that needs imaging or a recheck other than her yearly visits.   An After Visit Summary was printed and given to the patient.  _25_____ minutes face to face time of which over 50% was spent in counseling.

## 2017-12-16 ENCOUNTER — Telehealth: Payer: Self-pay

## 2017-12-16 MED FILL — LARIN FE 1-20 TABLET: 1-20 | 28 days supply | Qty: 28 | Fill #0

## 2017-12-16 NOTE — Telephone Encounter (Signed)
Patient left the office yesterday before screening mammogram appointment and GI appointment could be scheduled.  Spoke with patient. Advised patient screening mammogram appointment is scheduled for 12/29/2017 at 9:45 am. Appointment with Dr.Magod is scheduled for 01/17/18 at 10:45 am (this was first available). Patient is agreeable to both dates and times.  Routing to provider for final review. Patient agreeable to disposition. Will close encounter.

## 2017-12-30 DIAGNOSIS — Z1231 Encounter for screening mammogram for malignant neoplasm of breast: Secondary | ICD-10-CM | POA: Diagnosis not present

## 2018-01-10 MED FILL — FLUTICASONE PROP 50 MCG SPR: 50 | 90 days supply | Qty: 48 | Fill #2

## 2018-01-10 MED FILL — OMEPRAZOLE 20 MG CAP: 20 | 90 days supply | Qty: 90 | Fill #1

## 2018-01-10 MED FILL — NORETHIN-ESTRAD-FERR 1-0.02: 1-20 | 28 days supply | Qty: 28 | Fill #1

## 2018-01-18 MED FILL — AMPHETAMINE-DEXTROAMPHETAMI: 10 | 30 days supply | Qty: 60 | Fill #0

## 2018-03-01 ENCOUNTER — Encounter: Payer: Self-pay | Admitting: Obstetrics and Gynecology

## 2018-03-09 DIAGNOSIS — K449 Diaphragmatic hernia without obstruction or gangrene: Secondary | ICD-10-CM | POA: Diagnosis not present

## 2018-03-21 DIAGNOSIS — D229 Melanocytic nevi, unspecified: Secondary | ICD-10-CM | POA: Diagnosis not present

## 2018-03-21 DIAGNOSIS — L719 Rosacea, unspecified: Secondary | ICD-10-CM | POA: Diagnosis not present

## 2018-03-21 DIAGNOSIS — L219 Seborrheic dermatitis, unspecified: Secondary | ICD-10-CM | POA: Diagnosis not present

## 2018-04-01 MED FILL — AMPHETAMINE-DEXTROAMPHETAMI: 10 | 30 days supply | Qty: 60 | Fill #0

## 2018-04-01 MED FILL — OMEPRAZOLE 20 MG CPDR: 20 | 90 days supply | Qty: 90 | Fill #0

## 2018-04-04 DIAGNOSIS — S93601A Unspecified sprain of right foot, initial encounter: Secondary | ICD-10-CM | POA: Diagnosis not present

## 2018-04-13 DIAGNOSIS — F902 Attention-deficit hyperactivity disorder, combined type: Secondary | ICD-10-CM | POA: Diagnosis not present

## 2018-04-18 DIAGNOSIS — S93601D Unspecified sprain of right foot, subsequent encounter: Secondary | ICD-10-CM | POA: Diagnosis not present

## 2018-05-02 DIAGNOSIS — S93601D Unspecified sprain of right foot, subsequent encounter: Secondary | ICD-10-CM | POA: Diagnosis not present

## 2018-05-11 ENCOUNTER — Telehealth (INDEPENDENT_AMBULATORY_CARE_PROVIDER_SITE_OTHER): Payer: Self-pay | Admitting: *Deleted

## 2018-05-16 DIAGNOSIS — S93601D Unspecified sprain of right foot, subsequent encounter: Secondary | ICD-10-CM | POA: Diagnosis not present

## 2018-05-18 ENCOUNTER — Encounter (INDEPENDENT_AMBULATORY_CARE_PROVIDER_SITE_OTHER): Payer: 59 | Admitting: Physical Medicine and Rehabilitation

## 2018-05-26 ENCOUNTER — Ambulatory Visit (INDEPENDENT_AMBULATORY_CARE_PROVIDER_SITE_OTHER): Payer: Worker's Compensation | Admitting: Physical Medicine and Rehabilitation

## 2018-05-26 ENCOUNTER — Encounter (INDEPENDENT_AMBULATORY_CARE_PROVIDER_SITE_OTHER): Payer: Self-pay | Admitting: Physical Medicine and Rehabilitation

## 2018-05-26 DIAGNOSIS — M79601 Pain in right arm: Secondary | ICD-10-CM

## 2018-05-26 DIAGNOSIS — M542 Cervicalgia: Secondary | ICD-10-CM

## 2018-05-26 DIAGNOSIS — R202 Paresthesia of skin: Secondary | ICD-10-CM

## 2018-05-26 NOTE — Progress Notes (Signed)
 .  Numeric Pain Rating Scale and Functional Assessment Average Pain 7   In the last MONTH (on 0-10 scale) has pain interfered with the following?  1. General activity like being  able to carry out your everyday physical activities such as walking, climbing stairs, carrying groceries, or moving a chair?  Rating(4)       

## 2018-05-31 ENCOUNTER — Telehealth (INDEPENDENT_AMBULATORY_CARE_PROVIDER_SITE_OTHER): Payer: Self-pay | Admitting: *Deleted

## 2018-05-31 NOTE — Progress Notes (Signed)
[name] - 47 y.o. female MRN [id number]  Date of birth: 1971-02-14  Office Visit Note: Visit Date: 05/26/2018 PCP: Carol Ada, MD Referred by: Carol Ada, MD  Subjective: Chief Complaint  Patient presents with  . Neck - Pain, Numbness, Tingling  . Right Arm - Pain, Numbness, Tingling   HPI: [name] is a 47 y.o. female who comes in today At the request of Dr.Shawn Dalton-Bethea for electrodiagnostic study of the right upper limb.  We do not have complete notes to review but I have seen the patient in the past through Dr. Delfino Lovett Ramo's.  We actually completed radiofrequency ablation with some relief for several months.  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  She is employed by [employer] at [employer].  She reports numbness and tingling from the neck that refers into the right anterior and posterior lateral arm and hand into the pinky and ring finger.  She reports this started 2014 as documented in prior notes.  She feels like it is worse laying down.  Rest and change of position medication seem to help.  She is right-hand dominant.  She has had serial MRI exams of the cervical spine we have reviewed the cervical spine MRIs from 2014 and 2016.  These cervical MRI shows stable mild disc bulging at C4-5 and C5-6 without canal stenosis or nerve compression.  She has been seen by Dr. Sherwood Gambler at [facility].  She was also followed for a long while by Dr. Suella Broad at [facility].  She reports no specific increases or change in her symptoms just recalcitrant symptoms in the right arm and hand.  She denies any left-sided complaints.  She denies any focal weakness.  She does get some associated headaches.  She has had dry needling in the past and trigger point therapy.  She is also had prolotherapy by Dr. Herma Mering.  She reports the prolotherapy was helping but she did not complete a set number of procedures.  She also reports that she has been  asked to see Dr. Niel Hummer for possible what sounds like platelet rich plasma treatment.  ROS Otherwise per HPI.  Assessment & Plan: Visit Diagnoses:  1. Paresthesia of skin   2. Cervicalgia   3. Right arm pain     Plan: Impression: Essentially NORMAL electrodiagnostic study of the right upper limb.    There is no significant electrodiagnostic evidence of nerve entrapment, brachial plexopathy or cervical radiculopathy.    As you know, purely sensory or demyelinating radiculopathies and chemical radiculitis may not be detected with this particular electrodiagnostic study.  This electrodiagnostic study cannot rule out small fiber polyneuropathy and dysesthesias from central pain sensitization syndromes such as fibromyalgia.  Myotomal referral pain from trigger points is also not excluded.   Recommendations: 1.  Follow-up with referring physician.  Plasma rich platelet therapy may be an avenue of treatment as with prolotherapy and continued dry needling for myofascial pain syndrome. 2.  Continue current management of symptoms.     Meds & Orders: No orders of the defined types were placed in this encounter.   Orders Placed This Encounter  Procedures  . NCV with EMG (electromyography)    Follow-up: Return for Neomia Dear, MD.   Procedures: No procedures performed  EMG & NCV Findings: All examined nerves (as indicated in the following tables) were within normal limits.  Please note that initial test for the ulnar motor nerve was mislabeled as the radial motor  nerve and this was corrected.  All examined muscles (as indicated in the following table) showed no evidence of electrical instability.    Impression: Essentially NORMAL electrodiagnostic study of the right upper limb.    There is no significant electrodiagnostic evidence of nerve entrapment, brachial plexopathy or cervical radiculopathy.    As you know, purely sensory or demyelinating radiculopathies and  chemical radiculitis may not be detected with this particular electrodiagnostic study.  This electrodiagnostic study cannot rule out small fiber polyneuropathy and dysesthesias from central pain sensitization syndromes such as fibromyalgia.  Myotomal referral pain from trigger points is also not excluded.   Recommendations: 1.  Follow-up with referring physician.  Plasma rich platelet therapy may be an avenue of treatment as with prolotherapy and continued dry needling for myofascial pain syndrome. 2.  Continue current management of symptoms.  ___________________________ Laurence Spates FAAPMR Board Certified, American Board of Physical Medicine and Rehabilitation    Nerve Conduction Studies Anti Sensory Summary Table   Stim Site NR Peak (ms) Norm Peak (ms) P-T Amp (V) Norm P-T Amp Site1 Site2 Delta-P (ms) Dist (cm) Vel (m/s) Norm Vel (m/s)  Right Median Acr Palm Anti Sensory (2nd Digit)  31.7C  Wrist    3.3 <3.6 69.3 >10 Wrist Palm 1.4 0.0    Palm    1.9 <2.0 141.1         Right Radial Anti Sensory (Base 1st Digit)  32.1C  Wrist    2.1 <3.1 34.8  Wrist Base 1st Digit 2.1 0.0    Right Ulnar Anti Sensory (5th Digit)  31.7C  Wrist    3.6 <3.7 63.1 >15.0 Wrist 5th Digit 3.6 14.0 39 >38   Motor Summary Table   Stim Site NR Onset (ms) Norm Onset (ms) O-P Amp (mV) Norm O-P Amp Site1 Site2 Delta-0 (ms) Dist (cm) Vel (m/s) Norm Vel (m/s)  Right Median Motor (Abd Poll Brev)  32.3C  Wrist    3.3 <4.2 9.4 >5 Elbow Wrist 3.3 18.5 56 >50  Elbow    6.6  9.6         Right Ulnar Motor (Abd Digi min)  32.1C  8cm    3.6 <3.8 11.0 >1.7 B.Elbow 8cm 2.9 18.0 62 >50  B.Elbow    6.0  11.5  A. Elbow B.Elbow 1.4 9.0 64   A. Elbow    7.4  11.2          EMG   Side Muscle Nerve Root Ins Act Fibs Psw Amp Dur Poly Recrt Int Fraser Din Comment  Right 1stDorInt Ulnar C8-T1 Nml Nml Nml Nml Nml 0 Nml Nml   Right Abd Poll Brev Median C8-T1 Nml Nml Nml Nml Nml 0 Nml Nml   Right ExtDigCom   Nml Nml Nml Nml Nml 0  Nml Nml   Right Triceps Radial C6-7-8 Nml Nml Nml Nml Nml 0 Nml Nml   Right Deltoid Axillary C5-6 Nml Nml Nml Nml Nml 0 Nml Nml     Nerve Conduction Studies Anti Sensory Left/Right Comparison   Stim Site L Lat (ms) R Lat (ms) L-R Lat (ms) L Amp (V) R Amp (V) L-R Amp (%) Site1 Site2 L Vel (m/s) R Vel (m/s) L-R Vel (m/s)  Median Acr Palm Anti Sensory (2nd Digit)  31.7C  Wrist  3.3   69.3  Wrist Palm     Palm  1.9   141.1        Radial Anti Sensory (Base 1st Digit)  32.1C  Wrist  2.1  34.8  Wrist Base 1st Digit     Ulnar Anti Sensory (5th Digit)  31.7C  Wrist  3.6   63.1  Wrist 5th Digit  39    Motor Left/Right Comparison   Stim Site L Lat (ms) R Lat (ms) L-R Lat (ms) L Amp (mV) R Amp (mV) L-R Amp (%) Site1 Site2 L Vel (m/s) R Vel (m/s) L-R Vel (m/s)  Median Motor (Abd Poll Brev)  32.3C  Wrist  3.3   9.4  Elbow Wrist  56   Elbow  6.6   9.6        Right Ulnar Motor (Abd Digi min)  32.1C  8cm  2.3   11.0  B. Elbow 8cm  62   B. Elbow  6.0   11.5  A. Elbow B.Elbow  64   A. Elbow  7.4   11.2           Waveforms:            Clinical History: MRI CERVICAL SPINE WITHOUT CONTRAST  TECHNIQUE: Multiplanar, multisequence MR imaging of the cervical spine was performed. No intravenous contrast was administered.  COMPARISON:  Office radiographs 06/04/2015. Cervical MRI 02/07/2013.  FINDINGS: Alignment: Normal.  Bones: No suspicious osseous findings. Mild marrow heterogeneity is similar to the prior study.  Cord: Normal in signal and caliber.  Posterior Fossa: Visualized portions unremarkable.  Vertebral Arteries: Bilateral vertebral artery flow voids. The left vertebral artery is dominant.  Paraspinal tissues: Unremarkable.  Disc levels:  There is stable mild disc bulging at C4-5 and C5-6. The spinal canal and neural foramina are widely patent. No evidence of nerve root encroachment.  IMPRESSION: Stable MRI of the cervical spine demonstrating  minimal disc bulging at C4-5 and C5-6. No disc herniation, spinal stenosis or nerve root encroachment.   Electronically Signed   By: Richardean Sale M.D.   On: 07/16/2015 10:05  MRI Cervical Spine 01/2013  MRI CERVICAL SPINE WITHOUT CONTRAST  Technique: Multiplanar and multiecho pulse sequences of the cervical spine, to include the craniocervical junction and cervicothoracic junction, were obtained according to standard protocol without intravenous contrast.  Comparison: 10/31/2012. MRI 09/03/2006  Findings: Normal cervical alignment. Negative for fracture or mass lesion. Spinal cord signal is normal. There is no spinal stenosis. No disc protrusion is present. Minimal disc bulging at C4-5 and C5-6.  IMPRESSION: Minimal disc degeneration C4-5 and C5-6. Negative for neural impingement.   She reports that she has never smoked. She has never used smokeless tobacco. No results for input(s): HGBA1C, LABURIC in the last 8760 hours.  Objective:  VS:  HT:    WT:   BMI:     BP:   HR: bpm  TEMP: ( )  RESP:  Physical Exam  Constitutional: She is oriented to person, place, and time. She appears well-developed and well-nourished.  Eyes: Pupils are equal, round, and reactive to light. Conjunctivae and EOM are normal.  Neck:  There are active trigger points in the right levator scapula splenius capitis and trapezius.  Cardiovascular: Normal rate and intact distal pulses.  Pulmonary/Chest: Effort normal.  Musculoskeletal:  Inspection reveals no atrophy of the bilateral APB or FDI or hand intrinsics. There is no swelling, color changes, allodynia or dystrophic changes. There is 5 out of 5 strength in the bilateral wrist extension, finger abduction and long finger flexion.  There is subjective change of sensation or decreased sensation on the right and a C8 or ulnar nerve distribution particularly in the pinky and ring  finger.  There is a negative Froment's test bilaterally.  There is  a negative Hoffmann's test bilaterally.  Neurological: She is alert and oriented to person, place, and time. A sensory deficit is present. No cranial nerve deficit. She exhibits normal muscle tone. Coordination normal.  Skin: Skin is warm and dry. No rash noted. No erythema.  Psychiatric: She has a normal mood and affect. Her behavior is normal.  Nursing note and vitals reviewed.   Ortho Exam Imaging: No results found.  Past Medical/Family/Surgical/Social History: Medications & Allergies reviewed per EMR, new medications updated. Patient Active Problem List   Diagnosis Date Noted  . Rectal pain 12/15/2017  . Pain at injection site 10/01/2017  . Vaginal cyst 09/17/2017   Past Medical History:  Diagnosis Date  . GERD (gastroesophageal reflux disease)   . Headache(784.0)    otc meds prn  . Hyperthyroidism    no meds  . Irregular heart beat    history - no treatment  Dr Wynonia Lawman  . PONV (postoperative nausea and vomiting)   . Right ovarian cyst 2016   CT scan - 1 cm, possible dermoid.  . Sinus tachycardia   . Thrombocytosis ([place])   . Urinary incontinence    urgency and night urination  . Vaginal cyst 2019   posterior vaginal wall - 8 mm   Family History  Problem Relation Age of Onset  . Fibromyalgia Mother   . Thyroid disease Mother   . Seizures Father        d/t head injury  . Cancer Father        ?stomach ca  . Cancer Paternal Grandmother        colon  . Hypertension Paternal Grandmother   . Stroke Paternal Grandmother   . Diabetes Paternal Aunt   . Hypertension Maternal Grandmother   . Thyroid disease Maternal Grandmother    Past Surgical History:  Procedure Laterality Date  . ANTERIOR AND POSTERIOR REPAIR     with biological grafts  . BREAST SURGERY  04/07/2016   Lt.Br.Bx--Fibroadenoma  . DIAGNOSTIC LAPAROSCOPY    . DILATION AND CURETTAGE OF UTERUS    . ENDOMETRIAL ABLATION  05/2011  . INCONTINENCE SURGERY     [facility] midurethral sling and cystoscopy  .  LAPAROSCOPY  06/12/2011   Procedure: LAPAROSCOPY OPERATIVE;  Surgeon: Arloa Koh;  Location: [facility];  Service: Gynecology;  Laterality: N/A;   Left Ovarian Cystectomy  . NOSE SURGERY    . SHOULDER SURGERY     bilateral left x 2, right x 1 surgery  . svd      x 1  . UPPER GASTROINTESTINAL ENDOSCOPY    . WISDOM TOOTH EXTRACTION     Social History   Occupational History  . Not on file  Tobacco Use  . Smoking status: Never Smoker  . Smokeless tobacco: Never Used  Substance and Sexual Activity  . Alcohol use: Yes    Alcohol/week: 0.0 standard drinks    Comment: only occasionally  . Drug use: No  . Sexual activity: Yes    Partners: Female    Comment: ablation-[facility]

## 2018-05-31 NOTE — Procedures (Signed)
EMG & NCV Findings: All examined nerves (as indicated in the following tables) were within normal limits.  Please note that initial test for the ulnar motor nerve was mislabeled as the radial motor nerve and this was corrected.  All examined muscles (as indicated in the following table) showed no evidence of electrical instability.    Impression: Essentially NORMAL electrodiagnostic study of the right upper limb.    There is no significant electrodiagnostic evidence of nerve entrapment, brachial plexopathy or cervical radiculopathy.    As you know, purely sensory or demyelinating radiculopathies and chemical radiculitis may not be detected with this particular electrodiagnostic study.  This electrodiagnostic study cannot rule out small fiber polyneuropathy and dysesthesias from central pain sensitization syndromes such as fibromyalgia.  Myotomal referral pain from trigger points is also not excluded.   Recommendations: 1.  Follow-up with referring physician.  Plasma rich platelet therapy may be an avenue of treatment as with prolotherapy and continued dry needling for myofascial pain syndrome. 2.  Continue current management of symptoms.  ___________________________ Laurence Spates FAAPMR Board Certified, American Board of Physical Medicine and Rehabilitation    Nerve Conduction Studies Anti Sensory Summary Table   Stim Site NR Peak (ms) Norm Peak (ms) P-T Amp (V) Norm P-T Amp Site1 Site2 Delta-P (ms) Dist (cm) Vel (m/s) Norm Vel (m/s)  Right Median Acr Palm Anti Sensory (2nd Digit)  31.7C  Wrist    3.3 <3.6 69.3 >10 Wrist Palm 1.4 0.0    Palm    1.9 <2.0 141.1         Right Radial Anti Sensory (Base 1st Digit)  32.1C  Wrist    2.1 <3.1 34.8  Wrist Base 1st Digit 2.1 0.0    Right Ulnar Anti Sensory (5th Digit)  31.7C  Wrist    3.6 <3.7 63.1 >15.0 Wrist 5th Digit 3.6 14.0 39 >38   Motor Summary Table   Stim Site NR Onset (ms) Norm Onset (ms) O-P Amp (mV) Norm O-P Amp Site1 Site2  Delta-0 (ms) Dist (cm) Vel (m/s) Norm Vel (m/s)  Right Median Motor (Abd Poll Brev)  32.3C  Wrist    3.3 <4.2 9.4 >5 Elbow Wrist 3.3 18.5 56 >50  Elbow    6.6  9.6         Right Ulnar Motor (Abd Digi min)  32.1C  8cm    3.6 <3.8 11.0 >1.7 B.Elbow 8cm 2.9 18.0 62 >50  B.Elbow    6.0  11.5  A. Elbow B.Elbow 1.4 9.0 64   A. Elbow    7.4  11.2          EMG   Side Muscle Nerve Root Ins Act Fibs Psw Amp Dur Poly Recrt Int Fraser Din Comment  Right 1stDorInt Ulnar C8-T1 Nml Nml Nml Nml Nml 0 Nml Nml   Right Abd Poll Brev Median C8-T1 Nml Nml Nml Nml Nml 0 Nml Nml   Right ExtDigCom   Nml Nml Nml Nml Nml 0 Nml Nml   Right Triceps Radial C6-7-8 Nml Nml Nml Nml Nml 0 Nml Nml   Right Deltoid Axillary C5-6 Nml Nml Nml Nml Nml 0 Nml Nml     Nerve Conduction Studies Anti Sensory Left/Right Comparison   Stim Site L Lat (ms) R Lat (ms) L-R Lat (ms) L Amp (V) R Amp (V) L-R Amp (%) Site1 Site2 L Vel (m/s) R Vel (m/s) L-R Vel (m/s)  Median Acr Palm Anti Sensory (2nd Digit)  31.7C  Wrist  3.3  69.3  Wrist Palm     Palm  1.9   141.1        Radial Anti Sensory (Base 1st Digit)  32.1C  Wrist  2.1   34.8  Wrist Base 1st Digit     Ulnar Anti Sensory (5th Digit)  31.7C  Wrist  3.6   63.1  Wrist 5th Digit  39    Motor Left/Right Comparison   Stim Site L Lat (ms) R Lat (ms) L-R Lat (ms) L Amp (mV) R Amp (mV) L-R Amp (%) Site1 Site2 L Vel (m/s) R Vel (m/s) L-R Vel (m/s)  Median Motor (Abd Poll Brev)  32.3C  Wrist  3.3   9.4  Elbow Wrist  56   Elbow  6.6   9.6        Right Ulnar Motor (Abd Digi min)  32.1C  8cm  2.3   11.0  B. Elbow 8cm  62   B. Elbow  6.0   11.5  A. Elbow B.Elbow  64   A. Elbow  7.4   11.2           Waveforms:

## 2018-06-01 ENCOUNTER — Telehealth (INDEPENDENT_AMBULATORY_CARE_PROVIDER_SITE_OTHER): Payer: Self-pay

## 2018-06-01 NOTE — Telephone Encounter (Signed)
-----   Message from Ailene Rud, [place] sent at 05/31/2018  8:43 AM EST ----- Regarding: FW: make sure NCS/EMG get to referring Per Dr. Ernestina Patches to send EMG to [facility].  ----- Message ----- From: Magnus Sinning, MD Sent: 05/31/2018   8:16 AM EST To: Ailene Rud, [place] Subject: make sure NCS/EMG get to referring             Need report of NCS/EMG to get back to [facility] and or Workers comp folks

## 2018-06-01 NOTE — Telephone Encounter (Signed)
Faxed office note to [facility] adj Namon Cirri [phone]

## 2018-06-22 MED FILL — AMPHETAMINE-DEXTROAMPHETAMI: 10 | 30 days supply | Qty: 60 | Fill #0

## 2018-06-22 MED FILL — OMEPRAZOLE 20 MG CPDR: 20 | 90 days supply | Qty: 90 | Fill #1

## 2018-09-12 ENCOUNTER — Ambulatory Visit (INDEPENDENT_AMBULATORY_CARE_PROVIDER_SITE_OTHER): Payer: Self-pay | Admitting: Nurse Practitioner

## 2018-09-12 VITALS — BP 116/68 | HR 91 | Temp 98.5°F | Resp 20 | Wt 130.4 lb

## 2018-09-12 DIAGNOSIS — R509 Fever, unspecified: Secondary | ICD-10-CM

## 2018-09-12 DIAGNOSIS — J101 Influenza due to other identified influenza virus with other respiratory manifestations: Secondary | ICD-10-CM

## 2018-09-12 LAB — POCT INFLUENZA A/B
Influenza A, POC: POSITIVE — AB
Influenza B, POC: NEGATIVE

## 2018-09-12 MED ORDER — FLUTICASONE PROPIONATE 50 MCG/ACT NA SUSP: 2.0000 | Freq: Every day | NASAL | 0 refills | Status: DC

## 2018-09-12 MED ORDER — PSEUDOEPH-BROMPHEN-DM 30-2-10 MG/5ML PO SYRP: 5.0000 mL | ORAL_SOLUTION | Freq: Four times a day (QID) | ORAL | 0 refills | Status: AC | PRN

## 2018-09-12 MED ORDER — PROMETHAZINE-DM 6.25-15 MG/5ML PO SYRP: 10.0000 mL | ORAL_SOLUTION | Freq: Every evening | ORAL | 0 refills | Status: AC | PRN

## 2018-09-12 MED FILL — FLUTICASONE PROP 50 MCG SPR: 50 | 30 days supply | Qty: 16 | Fill #0

## 2018-09-12 MED FILL — BROMPHENIR-PSEUDOEPHED-DM S: 30-2-10 | 8 days supply | Qty: 150 | Fill #0

## 2018-09-12 MED FILL — PROMETHAZINE W/DM SYRUP: 6.25-15 | 7 days supply | Qty: 70 | Fill #0

## 2018-09-12 NOTE — Patient Instructions (Signed)
Influenza, Adult -Take medication as prescribed. -Ibuprofen or Tylenol for pain, fever, or general discomfort. -Increase fluids. -Get plenty of rest. -Sleep elevated on at least 2 pillows at bedtime to help with cough. -Use a humidifier or vaporizer when at home and during sleep to help with cough. -May use a teaspoon of honey or over-the-counter cough drops to help with cough. -Remain home until you have been fever-free for at least 48 hours without the use of Ibuprofen or Tylenol. -Follow-up if symptoms do not improve.  Influenza, more commonly known as "the flu," is a viral infection that mainly affects the respiratory tract. The respiratory tract includes organs that help you breathe, such as the lungs, nose, and throat. The flu causes many symptoms similar to the common cold along with high fever and body aches. The flu spreads easily from person to person (is contagious). Getting a flu shot (influenza vaccination) every year is the best way to prevent the flu. What are the causes? This condition is caused by the influenza virus. You can get the virus by:  Breathing in droplets that are in the air from an infected person's cough or sneeze.  Touching something that has been exposed to the virus (has been contaminated) and then touching your mouth, nose, or eyes. What increases the risk? The following factors may make you more likely to get the flu:  Not washing or sanitizing your hands often.  Having close contact with many people during cold and flu season.  Touching your mouth, eyes, or nose without first washing or sanitizing your hands.  Not getting a yearly (annual) flu shot. You may have a higher risk for the flu, including serious problems such as a lung infection (pneumonia), if you:  Are older than 65.  Are pregnant.  Have a weakened disease-fighting system (immune system). You may have a weakened immune system if you: ? Have HIV or AIDS. ? Are undergoing  chemotherapy. ? Are taking medicines that reduce (suppress) the activity of your immune system.  Have a long-term (chronic) illness, such as heart disease, kidney disease, diabetes, or lung disease.  Have a liver disorder.  Are severely overweight (morbidly obese).  Have anemia. This is a condition that affects your red blood cells.  Have asthma. What are the signs or symptoms? Symptoms of this condition usually begin suddenly and last 4-14 days. They may include:  Fever and chills.  Headaches, body aches, or muscle aches.  Sore throat.  Cough.  Runny or stuffy (congested) nose.  Chest discomfort.  Poor appetite.  Weakness or fatigue.  Dizziness.  Nausea or vomiting. How is this diagnosed? This condition may be diagnosed based on:  Your symptoms and medical history.  A physical exam.  Swabbing your nose or throat and testing the fluid for the influenza virus. How is this treated? If the flu is diagnosed early, you can be treated with medicine that can help reduce how severe the illness is and how long it lasts (antiviral medicine). This may be given by mouth (orally) or through an IV. Taking care of yourself at home can help relieve symptoms. Your health care provider may recommend:  Taking over-the-counter medicines.  Drinking plenty of fluids. In many cases, the flu goes away on its own. If you have severe symptoms or complications, you may be treated in a hospital. Follow these instructions at home: Activity  Rest as needed and get plenty of sleep.  Stay home from work or school as told by your  health care provider. Unless you are visiting your health care provider, avoid leaving home until your fever has been gone for 24 hours without taking medicine. Eating and drinking  Take an oral rehydration solution (ORS). This is a drink that is sold at pharmacies and retail stores.  Drink enough fluid to keep your urine pale yellow.  Drink clear fluids in  small amounts as you are able. Clear fluids include water, ice chips, diluted fruit juice, and low-calorie sports drinks.  Eat bland, easy-to-digest foods in small amounts as you are able. These foods include bananas, applesauce, rice, lean meats, toast, and crackers.  Avoid drinking fluids that contain a lot of sugar or caffeine, such as energy drinks, regular sports drinks, and soda.  Avoid alcohol.  Avoid spicy or fatty foods. General instructions      Take over-the-counter and prescription medicines only as told by your health care provider.  Use a cool mist humidifier to add humidity to the air in your home. This can make it easier to breathe.  Cover your mouth and nose when you cough or sneeze.  Wash your hands with soap and water often, especially after you cough or sneeze. If soap and water are not available, use alcohol-based hand sanitizer.  Keep all follow-up visits as told by your health care provider. This is important. How is this prevented?   Get an annual flu shot. You may get the flu shot in late summer, fall, or winter. Ask your health care provider when you should get your flu shot.  Avoid contact with people who are sick during cold and flu season. This is generally fall and winter. Contact a health care provider if:  You develop new symptoms.  You have: ? Chest pain. ? Diarrhea. ? A fever.  Your cough gets worse.  You produce more mucus.  You feel nauseous or you vomit. Get help right away if:  You develop shortness of breath or difficulty breathing.  Your skin or nails turn a bluish color.  You have severe pain or stiffness in your neck.  You develop a sudden headache or sudden pain in your face or ear.  You cannot eat or drink without vomiting. Summary  Influenza, more commonly known as "the flu," is a viral infection that primarily affects your respiratory tract.  Symptoms of the flu usually begin suddenly and last 4-14  days.  Getting an annual flu shot is the best way to prevent getting the flu.  Stay home from work or school as told by your health care provider. Unless you are visiting your health care provider, avoid leaving home until your fever has been gone for 24 hours without taking medicine.  Keep all follow-up visits as told by your health care provider. This is important. This information is not intended to replace advice given to you by your health care provider. Make sure you discuss any questions you have with your health care provider. Document Released: 07/10/2000 Document Revised: 12/29/2017 Document Reviewed: 12/29/2017 Elsevier Interactive Patient Education  2019 [employer].

## 2018-09-12 NOTE — Progress Notes (Signed)
Subjective:     [name] is a 48 y.o. female who presents for evaluation of influenza like symptoms. Symptoms include fevers up to 101.7 degrees, chills, right ear fullness, headache, myalgias, productive cough, sinus and nasal congestion, wheezing and fever, fatigue which been present for 3 days. Patient denies sore throat, ear pain, ear drainage, shortness of breath, difficulty breathing, abdominal pain, nausea, or vomiting.  She has tried to alleviate the symptoms with multitude of OTC cough and cold medicines with moderate relief.  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  High risk factors for influenza complications: none.  Patient has had an influenza vaccine this season.  The following portions of the patient's history were reviewed and updated as appropriate: allergies, current medications and past medical history.  Review of Systems Constitutional: positive for anorexia, chills, fatigue, fevers and malaise, negative for sweats and weight loss Eyes: negative Ears, nose, mouth, throat, and face: positive for nasal congestion, sore throat and Postnasal drip, right ear pressure, right maxillary/frontal sinus pressure, negative for ear drainage, earaches and hoarseness Respiratory: positive for cough, sputum and wheezing, negative for asthma, chronic bronchitis, dyspnea on exertion, hemoptysis and pneumonia Cardiovascular: negative Gastrointestinal: positive for Decreased appetite, negative for abdominal pain, diarrhea, nausea and vomiting Neurological: positive for headaches, negative for coordination problems, dizziness, paresthesia, vertigo and weakness     Objective:    BP 116/68 (BP Location: Right Arm, Patient Position: Sitting, Cuff Size: Normal)   Pulse 91   Temp 98.5 F (36.9 C) (Oral)   Resp 20   Wt 130 lb 6.4 oz (59.1 kg)   SpO2 97%   BMI 23.10 kg/m   Physical Exam Vitals signs reviewed.  Constitutional:      General: She is not  in acute distress. HENT:     Head: Normocephalic.     Right Ear: Ear canal and external ear normal. A middle ear effusion is present.     Left Ear: Tympanic membrane, ear canal and external ear normal.     Nose: Mucosal edema, congestion (moderate) and rhinorrhea (clear) present.     Right Turbinates: Enlarged and swollen.     Left Turbinates: Enlarged and swollen.     Right Sinus: Maxillary sinus tenderness and frontal sinus tenderness present.     Left Sinus: No maxillary sinus tenderness or frontal sinus tenderness.     Mouth/Throat:     Mouth: Mucous membranes are moist.     Pharynx: Posterior oropharyngeal erythema present. No oropharyngeal exudate.  Eyes:     Conjunctiva/sclera: Conjunctivae normal.     Pupils: Pupils are equal, round, and reactive to light.  Neck:     Musculoskeletal: Normal range of motion and neck supple.  Cardiovascular:     Rate and Rhythm: Normal rate and regular rhythm.     Pulses: Normal pulses.     Heart sounds: Normal heart sounds.  Pulmonary:     Effort: Pulmonary effort is normal. No respiratory distress.     Breath sounds: Normal breath sounds. No stridor. No wheezing, rhonchi or rales.  Abdominal:     General: Abdomen is flat. Bowel sounds are normal.     Tenderness: There is no abdominal tenderness.  Lymphadenopathy:     Cervical: No cervical adenopathy.  Skin:    General: Skin is warm and dry.     Capillary Refill: Capillary refill takes less than 2 seconds.  Neurological:     General: No focal  deficit present.     Mental Status: She is alert and oriented to person, place, and time.     Cranial Nerves: No cranial nerve deficit.  Psychiatric:        Mood and Affect: Mood normal.        Thought Content: Thought content normal.    Results for orders placed or performed in visit on 09/12/18 (from the past 24 hour(s))  POCT Influenza A/B     Status: Abnormal   Collection Time: 09/12/18  3:40 PM  Result Value Ref Range   Influenza A, POC  Positive (A) Negative   Influenza B, POC Negative Negative    Assessment:   Influenza A  Plan:   Exam findings, diagnosis etiology and medication use and indications reviewed with patient. Follow- Up and discharge instructions provided. No emergent/urgent issues found on exam. Based on the patient's clinical presentation, symptoms, physical assessment and positive Influenza A test, patient's findings are congruent with that of viral etiology.  The patient is past the window 48 hours for Tamiflu and has no comorbidities that may indicate it is needed; however, patient appears to be doing well with the symptomatic treatment she has been performing at home.  I will also provide the patient symptomatic treatment for her cough to include Bromfed during the daytime and Promethazine DM for nighttime.  I will also provide the patient with fluticasone for her sinus symptoms.  Patient was also instructed to continue antipyretics, increase fluids, and get plenty of rest.  The patient's vitals are stable, patient is in no acute distress and is well-appearing at this time.  Lungs CTAB.  Patient education was provided. Patient verbalized understanding of information provided and agrees with plan of care (POC), all questions answered. The patient is advised to call or return to clinic if condition does not see an improvement in symptoms, or to seek the care of the closest emergency department if condition worsens with the above plan.    1. Fever, unspecified fever cause  - POCT Influenza A/B  2. Influenza A  - brompheniramine-pseudoephedrine-DM 30-2-10 MG/5ML syrup; Take 5 mLs by mouth 4 (four) times daily as needed for up to 7 days.  Dispense: 150 mL; Refill: 0 - promethazine-dextromethorphan (PROMETHAZINE-DM) 6.25-15 MG/5ML syrup; Take 10 mLs by mouth at bedtime as needed for up to 7 days.  Dispense: 70 mL; Refill: 0 - fluticasone (FLONASE) 50 MCG/ACT nasal spray; Place 2 sprays into both nostrils daily for  10 days.  Dispense: 16 g; Refill: 0 -Take medication as prescribed. -Ibuprofen or Tylenol for pain, fever, or general discomfort. -Increase fluids. -Get plenty of rest. -Sleep elevated on at least 2 pillows at bedtime to help with cough. -Use a humidifier or vaporizer when at home and during sleep to help with cough. -May use a teaspoon of honey or over-the-counter cough drops to help with cough. -Remain home until you have been fever-free for at least 48 hours without the use of Ibuprofen or Tylenol. -Follow-up if symptoms do not improve.

## 2018-09-13 MED FILL — OMEPRAZOLE 20 MG CPDR: 20 | 90 days supply | Qty: 90 | Fill #2

## 2018-09-14 ENCOUNTER — Telehealth: Payer: Self-pay

## 2018-09-14 NOTE — Telephone Encounter (Signed)
Patient states she is doing better, she had an ear infection and she is doing everything as prescribed by the provider.

## 2018-09-18 ENCOUNTER — Ambulatory Visit (INDEPENDENT_AMBULATORY_CARE_PROVIDER_SITE_OTHER): Payer: Self-pay | Admitting: Family Medicine

## 2018-09-18 VITALS — BP 118/76 | HR 87 | Temp 98.8°F | Resp 20 | Wt 130.0 lb

## 2018-09-18 DIAGNOSIS — H6981 Other specified disorders of Eustachian tube, right ear: Secondary | ICD-10-CM

## 2018-09-18 DIAGNOSIS — J019 Acute sinusitis, unspecified: Secondary | ICD-10-CM

## 2018-09-18 MED ORDER — DOXYCYCLINE HYCLATE 100 MG PO TABS: 100.0000 mg | ORAL_TABLET | Freq: Two times a day (BID) | ORAL | 0 refills | Status: AC

## 2018-09-18 NOTE — Patient Instructions (Signed)

## 2018-09-18 NOTE — Progress Notes (Signed)
[name] is a 48 y.o. female who presents today with  8 days of total symptoms. She was originally seen on 2/17 with 3 days of flu like symptoms and tested positive for Influenza a. She reports that the supportive care treatment she received was helpful and that she is using it as prescribed but she reports developing ear pain and she is concerned that she has an ear infection or sinus infection.    Review of Systems  Constitutional: Negative for chills, fever and malaise/fatigue.  HENT: Positive for ear pain. Negative for congestion, ear discharge, hearing loss, sinus pain, sore throat and tinnitus.   Eyes: Negative.   Respiratory: Negative for cough, sputum production and shortness of breath.   Cardiovascular: Negative.  Negative for chest pain.  Gastrointestinal: Negative for abdominal pain, diarrhea, nausea and vomiting.  Genitourinary: Negative for dysuria, frequency, hematuria and urgency.  Musculoskeletal: Negative for myalgias.  Skin: Negative.   Neurological: Negative for headaches.  Endo/Heme/Allergies: Negative.   Psychiatric/Behavioral: Negative.     [name] has a current medication list which includes the following prescription(s): amphetamine-dextroamphetamine, brompheniramine-pseudoephedrine-dm, cetirizine, cyanocobalamin, cyclobenzaprine, diclofenac sodium, fluticasone, fluticasone, multivitamin with minerals, norethindrone, norethindrone-ethinyl estradiol, omeprazole-sodium bicarbonate, oxycodone-acetaminophen, promethazine-dextromethorphan, and doxycycline. Also is allergic to gadolinium derivatives; augmentin [amoxicillin-pot clavulanate]; buprenorphine hcl; codeine; iohexol; morphine and related; vicodin [hydrocodone-acetaminophen]; and lisdexamfetamine.  [name]  has a past medical history of GERD (gastroesophageal reflux disease), Headache(784.0), Hyperthyroidism, Irregular heart beat, PONV (postoperative nausea and vomiting), Right ovarian cyst (2016), Sinus  tachycardia, Thrombocytosis ([place]), Urinary incontinence, and Vaginal cyst (2019). Also  has a past surgical history that includes Anterior and posterior repair; Incontinence surgery; Nose surgery; Shoulder surgery; Upper gastrointestinal endoscopy; svd; Dilation and curettage of uterus; Diagnostic laparoscopy; Wisdom tooth extraction; laparoscopy (06/12/2011); Endometrial ablation (05/2011); and Breast surgery (04/07/2016).    O: Vitals:   09/18/18 1548  BP: 118/76  Pulse: 87  Resp: 20  Temp: 98.8 F (37.1 C)  SpO2: 99%     Physical Exam Vitals signs (WNL) reviewed.  Constitutional:      Appearance: She is well-developed. She is not toxic-appearing.  HENT:     Head: Normocephalic.     Right Ear: Hearing, ear canal and external ear normal. No middle ear effusion. Tympanic membrane is bulging. Tympanic membrane is not injected or erythematous.     Left Ear: Hearing, tympanic membrane, ear canal and external ear normal.  No middle ear effusion. Tympanic membrane is not injected, erythematous or bulging.     Nose: Nose normal. No congestion or rhinorrhea.     Right Sinus: No maxillary sinus tenderness or frontal sinus tenderness.     Left Sinus: No maxillary sinus tenderness or frontal sinus tenderness.     Mouth/Throat:     Lips: Pink.     Pharynx: Oropharynx is clear. Uvula midline.  Neck:     Musculoskeletal: Normal range of motion and neck supple.  Cardiovascular:     Rate and Rhythm: Normal rate and regular rhythm.     Pulses: Normal pulses.     Heart sounds: Normal heart sounds.  Pulmonary:     Effort: Pulmonary effort is normal.     Breath sounds: Normal breath sounds.  Abdominal:     General: Bowel sounds are normal.     Palpations: Abdomen is soft.  Musculoskeletal: Normal range of motion.  Lymphadenopathy:     Head:     Right side of head: No submental or submandibular adenopathy.     Left side  of head: No submental or submandibular adenopathy.     Cervical: No  cervical adenopathy.  Neurological:     Mental Status: She is alert and oriented to person, place, and time.    A: 1. Eustachian tube dysfunction, right   2. Acute non-recurrent sinusitis, unspecified location    P: Discussed with patient suspected dx and no need for abx. Otherwise normal exam- some evidence of fluid behind one TM that does not appear infected and should easily respond to decongestant. For treatment of this condition. Discussed the risk factors for developing related to her recent URI, normal vital signs and no evidence of infection on PE. Patient was assertive that she feels that she needs an antibiotic for this condition and was not amendable to supportive care only. Abx. Prescribed and teaching and typical course of treatment about condition reinforced.    1. Eustachian tube dysfunction, right - doxycycline (VIBRA-TABS) 100 MG tablet; Take 1 tablet (100 mg total) by mouth 2 (two) times daily for 14 days.  2. Acute non-recurrent sinusitis, unspecified location - doxycycline (VIBRA-TABS) 100 MG tablet; Take 1 tablet (100 mg total) by mouth 2 (two) times daily for 14 days.   Discussed with patient exam findings, suspected diagnosis etiology and  reviewed recommended treatment plan and follow up, including complications and indications for urgent medical follow up and evaluation. Medications including use and indications reviewed with patient. Patient provided relevant patient education on diagnosis and/or relevant related condition that were discussed and reviewed with patient at discharge. Patient verbalized understanding of information provided and agrees with plan of care (POC), all questions answered.

## 2018-09-19 ENCOUNTER — Telehealth (INDEPENDENT_AMBULATORY_CARE_PROVIDER_SITE_OTHER): Payer: Self-pay | Admitting: Physical Medicine and Rehabilitation

## 2018-09-19 NOTE — Telephone Encounter (Signed)
[name] with [facility] emailed,stated she only received 2019 records and was missing 2016 notes. I emailed 2016 [place] records to her

## 2018-09-20 ENCOUNTER — Telehealth: Payer: Self-pay

## 2018-09-20 NOTE — Telephone Encounter (Signed)
Called and lm on pt vm tcb regarding how she is feeling since her visit with [place].

## 2018-09-28 ENCOUNTER — Ambulatory Visit: Payer: 59 | Admitting: Obstetrics and Gynecology

## 2018-10-05 ENCOUNTER — Ambulatory Visit (INDEPENDENT_AMBULATORY_CARE_PROVIDER_SITE_OTHER): Payer: No Typology Code available for payment source | Admitting: Obstetrics and Gynecology

## 2018-10-05 ENCOUNTER — Encounter: Payer: Self-pay | Admitting: Obstetrics and Gynecology

## 2018-10-05 ENCOUNTER — Other Ambulatory Visit: Payer: Self-pay

## 2018-10-05 VITALS — BP 100/60 | HR 76 | Resp 16 | Ht 63.0 in | Wt 130.0 lb

## 2018-10-05 DIAGNOSIS — R140 Abdominal distension (gaseous): Secondary | ICD-10-CM

## 2018-10-05 DIAGNOSIS — Z01419 Encounter for gynecological examination (general) (routine) without abnormal findings: Secondary | ICD-10-CM

## 2018-10-05 NOTE — Progress Notes (Signed)
48 y.o. G91P1001 Single Caucasian female here for annual exam.    Having rectal pain still. Will see Dr. Watt Climes for her yearly visit.   Having a lot of bloating.  Feels her abdomen is distended at the end of the day.   She is happy to have had an endometrial ablation.  Menses are are very light with just one wipe with the tissue, once or twice a month.  Some hot flashes.  She has cramping but does not want treatment for this.   Stopped birth control pills previously prescribed due to weight gain.  States it is not worth it to treat her cycle issues due to side effects of medications.   Normal pelvic US 04/06/2017.  Had the flu.   PCP: Carol Ada, MD    Patient's last menstrual period was 09/23/2018.           Sexually active: Yes.    Female partner The current method of family planning is Novasure Ablation.    Exercising: Yes.    cardio Smoker:  no  Health Maintenance: Pap:  09/15/17 pap and HR HPV negative History of abnormal Pap:  no MMG:  12/30/17 BIRADS 2 benign/density c Colonoscopy:  none BMD:   n/a  Result  n/a TDaP:  09/15/17 Gardasil:   no HIV and Hep C: 09/15/17 Negative Screening Labs:  Discuss if needed   reports that she has never smoked. She has never used smokeless tobacco. She reports current alcohol use. She reports that she does not use drugs.  Past Medical History:  Diagnosis Date  . GERD (gastroesophageal reflux disease)   . Headache(784.0)    otc meds prn  . Hyperthyroidism    no meds  . Irregular heart beat    history - no treatment  Dr Wynonia Lawman  . PONV (postoperative nausea and vomiting)   . Right ovarian cyst 2016   CT scan - 1 cm, possible dermoid.  . Sinus tachycardia   . Thrombocytosis ([place])   . Urinary incontinence    urgency and night urination  . Vaginal cyst 2019   posterior vaginal wall - 8 mm    Past Surgical History:  Procedure Laterality Date  . ANTERIOR AND POSTERIOR REPAIR     with biological grafts  . BREAST SURGERY   04/07/2016   Lt.Br.Bx--Fibroadenoma  . DIAGNOSTIC LAPAROSCOPY    . DILATION AND CURETTAGE OF UTERUS    . ENDOMETRIAL ABLATION  05/2011  . INCONTINENCE SURGERY     [facility] midurethral sling and cystoscopy  . LAPAROSCOPY  06/12/2011   Procedure: LAPAROSCOPY OPERATIVE;  Surgeon: Arloa Koh;  Location: [facility];  Service: Gynecology;  Laterality: N/A;   Left Ovarian Cystectomy  . NOSE SURGERY    . SHOULDER SURGERY     bilateral left x 2, right x 1 surgery  . svd      x 1  . UPPER GASTROINTESTINAL ENDOSCOPY    . WISDOM TOOTH EXTRACTION      Current Outpatient Medications  Medication Sig Dispense Refill  . amphetamine-dextroamphetamine (ADDERALL) 10 MG tablet Take 10 mg by mouth 2 (two) times daily.  0  . cetirizine (ZYRTEC) 10 MG tablet Take 10 mg by mouth daily.    . Cyanocobalamin (VITAMIN B12 PO) Take 1 each by mouth as needed. gummie     . cyclobenzaprine (FLEXERIL) 10 MG tablet Take 10 mg by mouth as needed.  0  . diclofenac sodium (VOLTAREN) 1 % GEL as needed.  0  .  fluticasone (FLONASE) 50 MCG/ACT nasal spray Place 2 sprays into both nostrils daily.  3  . Magnesium 100 MG CAPS magnesium    . methocarbamol (ROBAXIN) 500 MG tablet as needed.    . Multiple Vitamins-Minerals (MULTIVITAMIN WITH MINERALS) tablet Take 1 tablet by mouth daily.      Marland Kitchen omeprazole-sodium bicarbonate (ZEGERID) 40-1100 MG per capsule Take 1 capsule by mouth daily before breakfast.      . oxyCODONE-acetaminophen (PERCOCET/ROXICET) 5-325 MG tablet Take 1 tablet by mouth every 4 (four) hours. Prn pain  0  . VYVANSE 20 MG capsule Take 20 mg by mouth daily.  0  . fluticasone (FLONASE) 50 MCG/ACT nasal spray Place 2 sprays into both nostrils daily for 10 days. 16 g 0   No current facility-administered medications for this visit.     Family History  Problem Relation Age of Onset  . Fibromyalgia Mother   . Thyroid disease Mother   . Seizures Father        d/t head injury  . Cancer Father        ?stomach ca   . Cancer Paternal Grandmother        colon  . Hypertension Paternal Grandmother   . Stroke Paternal Grandmother   . Diabetes Paternal Aunt   . Hypertension Maternal Grandmother   . Thyroid disease Maternal Grandmother     Review of Systems  Constitutional: Negative.   HENT: Negative.   Eyes: Negative.   Respiratory: Negative.   Cardiovascular: Negative.   Gastrointestinal:       Bloating   Endocrine: Negative.   Genitourinary: Negative.   Musculoskeletal: Negative.   Skin: Negative.   Allergic/Immunologic: Negative.   Neurological: Negative.   Hematological: Negative.   Psychiatric/Behavioral: Negative.     Exam:   BP 100/60 (BP Location: Right Arm, Patient Position: Sitting, Cuff Size: Normal)   Pulse 76   Resp 16   Ht 5\' 3"  (1.6 m)   Wt 130 lb (59 kg)   LMP 09/23/2018   BMI 23.03 kg/m     General appearance: alert, cooperative and appears stated age Head: Normocephalic, without obvious abnormality, atraumatic Neck: no adenopathy, supple, symmetrical, trachea midline and thyroid normal to inspection and palpation Lungs: clear to auscultation bilaterally Breasts: normal appearance, no masses or tenderness, No nipple retraction or dimpling, No nipple discharge or bleeding, No axillary or supraclavicular adenopathy Heart: regular rate and rhythm Abdomen: soft, non-tender; no masses, no organomegaly Extremities: extremities normal, atraumatic, no cyanosis or edema Skin: Skin color, texture, turgor normal. No rashes or lesions Lymph nodes: Cervical, supraclavicular, and axillary nodes normal. No abnormal inguinal nodes palpated Neurologic: Grossly normal  Pelvic: External genitalia:  no lesions              Urethra:  normal appearing urethra with no masses, tenderness or lesions              Bartholins and Skenes: normal                 Vagina: normal appearing vagina with normal color and discharge, no lesions              Cervix: no lesions              Pap  taken:  No.  Bimanual Exam:  Uterus:  normal size, contour, position, consistency, mobility, non-tender              Adnexa: no mass, fullness, tenderness  Rectal exam: Yes.  .  Confirms.              Anus:  normal sphincter tone, no lesions  Chaperone was present for exam.  Assessment:   Well woman visit with normal exam. Status post A and P repair with biological grafts and midurethral sling Rectovaginal mass.  Feels like a cyst and is not increasing in size. Likely from prior pelvic prolapse surgery.  Rectal pain.  FH of colon and GI cancer. Status post endometrial ablation.  Dysmenorrhea and irregular menstrual bleeding. Menstrual headaches. Overactive bladder. Off medication due to urinary retention. Abdominal bloating.  Hx left breast fibroadenoma.   Plan: Mammogram screening. Recommended self breast awareness. Pap and HR HPV as above. Guidelines for Calcium, Vitamin D, regular exercise program including cardiovascular and weight bearing exercise. Routine labs, tissue transglutaminase. FU with GI.  Follow up annually and prn.   After visit summary provided.

## 2018-10-05 NOTE — Patient Instructions (Signed)

## 2018-10-06 LAB — COMPREHENSIVE METABOLIC PANEL
ALT: 13 IU/L (ref 0–32)
AST: 16 IU/L (ref 0–40)
Albumin/Globulin Ratio: 1.5 (ref 1.2–2.2)
Albumin: 4.2 g/dL (ref 3.8–4.8)
Alkaline Phosphatase: 64 IU/L (ref 39–117)
BUN/Creatinine Ratio: 14 (ref 9–23)
BUN: 11 mg/dL (ref 6–24)
Bilirubin Total: 0.2 mg/dL (ref 0.0–1.2)
CO2: 23 mmol/L (ref 20–29)
Calcium: 9.6 mg/dL (ref 8.7–10.2)
Chloride: 103 mmol/L (ref 96–106)
Creatinine, Ser: 0.81 mg/dL (ref 0.57–1.00)
GFR calc Af Amer: 99 mL/min/{1.73_m2} (ref >59)
GFR calc non Af Amer: 86 mL/min/{1.73_m2} (ref >59)
Globulin, Total: 2.8 g/dL (ref 1.5–4.5)
Glucose: 106 mg/dL — ABNORMAL HIGH (ref 65–99)
Potassium: 3.9 mmol/L (ref 3.5–5.2)
Sodium: 141 mmol/L (ref 134–144)
Total Protein: 7 g/dL (ref 6.0–8.5)

## 2018-10-06 LAB — LIPID PANEL
Chol/HDL Ratio: 3.4 ratio (ref 0.0–4.4)
Cholesterol, Total: 209 mg/dL — ABNORMAL HIGH (ref 100–199)
HDL: 62 mg/dL (ref >39)
LDL Calculated: 124 mg/dL — ABNORMAL HIGH (ref 0–99)
Triglycerides: 116 mg/dL (ref 0–149)
VLDL Cholesterol Cal: 23 mg/dL (ref 5–40)

## 2018-10-06 LAB — CBC
Hematocrit: 37.3 % (ref 34.0–46.6)
Hemoglobin: 13.2 g/dL (ref 11.1–15.9)
MCH: 32 pg (ref 26.6–33.0)
MCHC: 35.4 g/dL (ref 31.5–35.7)
MCV: 90 fL (ref 79–97)
Platelets: 422 10*3/uL (ref 150–450)
RBC: 4.13 x10E6/uL (ref 3.77–5.28)
RDW: 12.8 % (ref 11.7–15.4)
WBC: 7.1 10*3/uL (ref 3.4–10.8)

## 2018-10-06 LAB — VITAMIN D 25 HYDROXY (VIT D DEFICIENCY, FRACTURES): Vit D, 25-Hydroxy: 25.9 ng/mL — ABNORMAL LOW (ref 30.0–100.0)

## 2018-10-06 LAB — TSH: TSH: 1.16 u[IU]/mL (ref 0.450–4.500)

## 2018-10-06 LAB — TISSUE TRANSGLUTAMINASE, IGA: Transglutaminase IgA: <2 U/mL (ref 0–3)

## 2018-10-06 MED FILL — VYVANSE 20 MG CAPSULE: 20 | 30 days supply | Qty: 30 | Fill #0

## 2018-10-07 ENCOUNTER — Encounter: Payer: Self-pay | Admitting: Obstetrics and Gynecology

## 2018-10-11 LAB — HGB A1C W/O EAG: Hgb A1c MFr Bld: 5.4 % (ref 4.8–5.6)

## 2018-10-11 LAB — SPECIMEN STATUS REPORT

## 2018-11-10 MED FILL — VYVANSE 20 MG CAPSULE: 20 | 30 days supply | Qty: 30 | Fill #0

## 2018-12-03 ENCOUNTER — Other Ambulatory Visit: Payer: Self-pay | Admitting: Nurse Practitioner

## 2019-01-06 MED FILL — FLUTICASONE PROP 50 MCG SPR: 50 | 90 days supply | Qty: 48 | Fill #0

## 2019-01-11 MED FILL — VYVANSE 20 MG CAPSULE: 20 | 30 days supply | Qty: 30 | Fill #0

## 2019-01-19 MED FILL — OMEPRAZOLE 20 MG CPDR: 20 | 90 days supply | Qty: 90 | Fill #0

## 2019-01-24 DIAGNOSIS — M47812 Spondylosis without myelopathy or radiculopathy, cervical region: Secondary | ICD-10-CM | POA: Insufficient documentation

## 2019-02-08 MED FILL — VYVANSE 20 MG CAPSULE: 20 | 30 days supply | Qty: 30 | Fill #0

## 2019-03-16 MED FILL — VYVANSE 20 MG CAPSULE: 20 | 30 days supply | Qty: 30 | Fill #0

## 2019-03-27 ENCOUNTER — Ambulatory Visit (HOSPITAL_COMMUNITY)
Admission: RE | Admit: 2019-03-27 | Discharge: 2019-03-27 | Disposition: A | Discharge status: Home / Self Care | Payer: No Typology Code available for payment source | Source: Ambulatory Visit | Attending: Family Medicine | Admitting: Family Medicine

## 2019-03-27 ENCOUNTER — Other Ambulatory Visit (HOSPITAL_COMMUNITY): Payer: Self-pay | Admitting: Family Medicine

## 2019-03-27 DIAGNOSIS — R0789 Other chest pain: Secondary | ICD-10-CM | POA: Diagnosis not present

## 2019-03-27 MED FILL — ALBUTEROL SULFATE HFA 108 (: 108 (90 BAS | 17 days supply | Qty: 9 | Fill #0

## 2019-03-31 MED FILL — predniSONE 10 MG TABS: 10 | 6 days supply | Qty: 21 | Fill #0

## 2019-04-14 MED FILL — OMEPRAZOLE 20 MG CAPSULE DR: 20 | 30 days supply | Qty: 30 | Fill #0

## 2019-04-17 MED FILL — VYVANSE 20 MG CAPSULE: 20 | 30 days supply | Qty: 30 | Fill #0

## 2019-05-25 MED FILL — FLUTICASONE PROP 50 MCG SPR: 50 | 90 days supply | Qty: 48 | Fill #0

## 2019-05-26 MED FILL — OMEPRAZOLE 20 MG CAPSULE DR: 20 | 90 days supply | Qty: 90 | Fill #0

## 2019-05-29 MED FILL — VYVANSE 20 MG CAPSULE: 20 | 30 days supply | Qty: 30 | Fill #0

## 2019-06-29 MED FILL — VYVANSE 20 MG CAPSULE: 20 | 30 days supply | Qty: 30 | Fill #0

## 2019-08-14 MED FILL — FLUTICASONE PROP 50 MCG SPR: 50 | 90 days supply | Qty: 48 | Fill #1

## 2019-08-14 MED FILL — VYVANSE 20 MG CAPSULE: 20 | 30 days supply | Qty: 30 | Fill #0

## 2019-08-14 MED FILL — OMEPRAZOLE DR 20 MG CAPSULE: 20 | 90 days supply | Qty: 90 | Fill #1

## 2019-08-17 DIAGNOSIS — H9193 Unspecified hearing loss, bilateral: Secondary | ICD-10-CM | POA: Insufficient documentation

## 2019-09-05 ENCOUNTER — Other Ambulatory Visit: Payer: Self-pay | Admitting: Physician Assistant

## 2019-09-05 ENCOUNTER — Other Ambulatory Visit (HOSPITAL_COMMUNITY): Payer: Self-pay | Admitting: Physician Assistant

## 2019-09-05 DIAGNOSIS — R519 Headache, unspecified: Secondary | ICD-10-CM

## 2019-09-05 DIAGNOSIS — G8929 Other chronic pain: Secondary | ICD-10-CM

## 2019-09-11 ENCOUNTER — Ambulatory Visit (HOSPITAL_COMMUNITY)
Admission: RE | Admit: 2019-09-11 | Discharge: 2019-09-11 | Disposition: A | Discharge status: Home / Self Care | Payer: No Typology Code available for payment source | Source: Ambulatory Visit | Attending: Physician Assistant | Admitting: Physician Assistant

## 2019-09-11 ENCOUNTER — Other Ambulatory Visit: Payer: Self-pay

## 2019-09-11 DIAGNOSIS — R519 Headache, unspecified: Secondary | ICD-10-CM | POA: Insufficient documentation

## 2019-09-11 DIAGNOSIS — G8929 Other chronic pain: Secondary | ICD-10-CM | POA: Insufficient documentation

## 2019-09-27 ENCOUNTER — Telehealth: Payer: Self-pay | Admitting: *Deleted

## 2019-09-27 NOTE — Telephone Encounter (Signed)
I spoke with pt and told her she could have gotten re-infected and would need an appt to be reevaluated. Pt states she is with [facility] plan and will need to get a referral. Pt asked if it would take a long time to get an appt. I told her she would return as an established new pt and may want to see our new Dr. Posey Pronto who has openings. Pt states understandign.

## 2019-09-27 NOTE — Telephone Encounter (Signed)
Pt states Dr. Amalia Hailey treated her fungus with laser and trimming and it appears the fungus has returned.

## 2019-09-28 MED FILL — VYVANSE 20 MG CAPSULE: 20 | 30 days supply | Qty: 30 | Fill #0

## 2019-09-29 ENCOUNTER — Other Ambulatory Visit: Payer: Self-pay

## 2019-09-29 ENCOUNTER — Ambulatory Visit: Payer: No Typology Code available for payment source | Admitting: Podiatry

## 2019-09-29 DIAGNOSIS — B351 Tinea unguium: Secondary | ICD-10-CM | POA: Diagnosis not present

## 2019-10-03 ENCOUNTER — Encounter: Payer: Self-pay | Admitting: Podiatry

## 2019-10-03 NOTE — Progress Notes (Addendum)
  Subjective:  Patient ID: [name], female    DOB: Apr 25, 1971,  MRN: [id number]  Chief Complaint  Patient presents with  . Nail Problem    pt is here for bil toenail fungus, which has been going on for 3-5 years, pt states that the 5th right toenail will feel sore times, but states she has overall minimum pain   49 y.o. female returns for the above complaint.  Patient presents with possible concern for bilateral toenail fungus in the hallux second digit as well as third digit.  Patient states they have been going for 3 to 5 years has progressively gotten worse.  She has tried some topical medication which has not helped.  Patient states the fifth toenails also sometimes sore at times given the thickness of the nail.  She would like to know if there were other various treatment options and would like to discuss possible laser therapy to help decrease the fungal growth.  Objective:  There were no vitals filed for this visit. Podiatric Exam: Vascular: dorsalis pedis and posterior tibial pulses are palpable bilateral. Capillary return is immediate. Temperature gradient is WNL. Skin turgor WNL  Sensorium: Normal Semmes Weinstein monofilament test. Normal tactile sensation bilaterally. Nail Exam: Pt has thick disfigured discolored nails with subungual debris noted bilateral hallux, second digit, third digit and right fifth digit Ulcer Exam: There is no evidence of ulcer or pre-ulcerative changes or infection. Orthopedic Exam: Muscle tone and strength are WNL. No limitations in general ROM. No crepitus or effusions noted. HAV  B/L.  Hammer toes 2-5  B/L. Skin: No Porokeratosis. No infection or ulcers  Assessment & Plan:  Patient was evaluated and treated and all questions answered.  Onychomycosis/nail fungus bilateral hallux, second digit, third digit and right fifth digit  Educated the patient on the etiology of onychomycosis and various treatment options associated with improving the fungal  load.  I explained to the patient that there is 3 treatment options available to treat the onychomycosis including topical, p.o., laser treatment.  Patient elected to undergo laser therapy.  I explained to the patient that this is an out-of-pocket expense and insurance will not cover it.  I also explained to the patient that he tends to take about 6-7 sessions to start noticing decreasing fungal low load as well as improvement in the nail.  Patient states understanding -She will be scheduled to see Caryl Pina for laser treatment for onychomycosis   Boneta Lucks, DPM    Return for See Caryl Pina for laser treatment.

## 2019-10-10 NOTE — Progress Notes (Signed)
49 y.o. G37P1001 Single Caucasian female here for annual exam.    Doesn't empty bladder completely. She is not voiding as often due to long working shifts.  Not taking medication for overactive bladder.  She is assisting vaginally to help herself void.   Taking probiotics to help keep bowel function regular.   Has hiatal hernia and is doubling up on reflux medication.   Completed Covid vaccine.   PCP:  Carol Ada, MD   Patient's last menstrual period was 09/13/2019 (approximate).           Sexually active: Yes.    The current method of family planning is Novasure--female partner.    Exercising: Yes.    weights  Smoker:  no  Health Maintenance: Pap: 09-15-17 Neg:Neg HR HPV History of abnormal Pap:  no MMG: 12-30-17 3D/Neg/density C/BiRads2--knows needs to schedule Colonoscopy:  No.  She will see Dr. Watt Climes. BMD:   n/a  Result  n/a TDaP:  09-15-17 Gardasil:   no HIV: 09-15-17 NR Hep C: 09-15-17 Neg Screening Labs:  today   reports that she has never smoked. She has never used smokeless tobacco. She reports current alcohol use. She reports that she does not use drugs.  Past Medical History:  Diagnosis Date  . GERD (gastroesophageal reflux disease)   . Headache(784.0)    otc meds prn  . Hyperthyroidism    no meds  . Irregular heart beat    history - no treatment  Dr Wynonia Lawman  . Low vitamin D level   . PONV (postoperative nausea and vomiting)   . Right ovarian cyst 2016   CT scan - 1 cm, possible dermoid.  . Sinus tachycardia   . Thrombocytosis ([place])   . Urinary incontinence    urgency and night urination  . Vaginal cyst 2019   posterior vaginal wall - 8 mm    Past Surgical History:  Procedure Laterality Date  . ANTERIOR AND POSTERIOR REPAIR     with biological grafts  . BREAST SURGERY  04/07/2016   Lt.Br.Bx--Fibroadenoma  . DIAGNOSTIC LAPAROSCOPY    . DILATION AND CURETTAGE OF UTERUS    . ENDOMETRIAL ABLATION  05/2011  . INCONTINENCE SURGERY     [facility]  midurethral sling and cystoscopy  . LAPAROSCOPY  06/12/2011   Procedure: LAPAROSCOPY OPERATIVE;  Surgeon: Arloa Koh;  Location: [facility];  Service: Gynecology;  Laterality: N/A;   Left Ovarian Cystectomy  . NOSE SURGERY    . SHOULDER SURGERY     bilateral left x 2, right x 1 surgery  . svd      x 1  . UPPER GASTROINTESTINAL ENDOSCOPY    . WISDOM TOOTH EXTRACTION      Current Outpatient Medications  Medication Sig Dispense Refill  . amphetamine-dextroamphetamine (ADDERALL) 10 MG tablet Take 10 mg by mouth 2 (two) times daily.  0  . cetirizine (ZYRTEC) 10 MG tablet Take 10 mg by mouth daily.    . Cyanocobalamin (VITAMIN B12 PO) Take 1 each by mouth as needed. gummie     . cyclobenzaprine (FLEXERIL) 10 MG tablet Take 10 mg by mouth as needed.  0  . diclofenac sodium (VOLTAREN) 1 % GEL as needed.  0  . fluticasone (FLONASE) 50 MCG/ACT nasal spray Place 2 sprays into both nostrils daily.  3  . Magnesium 100 MG CAPS magnesium    . methocarbamol (ROBAXIN) 500 MG tablet as needed.    . Multiple Vitamins-Minerals (MULTIVITAMIN WITH MINERALS) tablet Take 1 tablet by mouth  daily.      . omeprazole (PRILOSEC) 20 MG capsule omeprazole 20 mg capsule,delayed release    . omeprazole-sodium bicarbonate (ZEGERID) 40-1100 MG per capsule Take 1 capsule by mouth daily before breakfast.      . oxyCODONE-acetaminophen (PERCOCET/ROXICET) 5-325 MG tablet Take 1 tablet by mouth every 4 (four) hours. Prn pain  0  . VYVANSE 20 MG capsule Take 20 mg by mouth daily.  0  . fluticasone (FLONASE) 50 MCG/ACT nasal spray Place 2 sprays into both nostrils daily for 10 days. 16 g 0   No current facility-administered medications for this visit.    Family History  Problem Relation Age of Onset  . Fibromyalgia Mother   . Thyroid disease Mother   . Seizures Father        d/t head injury  . Cancer Father        ?stomach ca  . Cancer Paternal Grandmother        colon  . Hypertension Paternal Grandmother   .  Stroke Paternal Grandmother   . Diabetes Paternal Aunt   . Hypertension Maternal Grandmother   . Thyroid disease Maternal Grandmother     Review of Systems  Genitourinary:       Doesn't empty bladder completely--  All other systems reviewed and are negative.   Exam:   BP 116/72   Pulse 76   Temp (!) 97 F (36.1 C) (Temporal)   Resp 14   Ht 5' 3.5" (1.613 m)   Wt 130 lb 12.8 oz (59.3 kg)   LMP 09/13/2019 (Approximate) Comment: only spotting  BMI 22.81 kg/m     General appearance: alert, cooperative and appears stated age Head: normocephalic, without obvious abnormality, atraumatic Neck: no adenopathy, supple, symmetrical, trachea midline and thyroid normal to inspection and palpation Lungs: clear to auscultation bilaterally Breasts: normal appearance, no masses or tenderness, No nipple retraction or dimpling, No nipple discharge or bleeding, No axillary adenopathy Heart: regular rate and rhythm Abdomen: soft, non-tender; no masses, no organomegaly Extremities: extremities normal, atraumatic, no cyanosis or edema Skin: skin color, texture, turgor normal. No rashes or lesions Lymph nodes: cervical, supraclavicular, and axillary nodes normal. Neurologic: grossly normal  Pelvic: External genitalia:  no lesions              No abnormal inguinal nodes palpated.              Urethra:  normal appearing urethra with no masses, tenderness or lesions              Bartholins and Skenes: normal                 Vagina: normal appearing vagina with normal color and discharge, no lesions              Cervix: no lesions              Pap taken: No. Bimanual Exam:  Uterus:  normal size, contour, position, consistency, mobility, non-tender              Adnexa: no mass, fullness, tenderness              Rectal exam: Yes.  .  Confirms.  1 cm rectovaginal cyst, nontender.              Anus:  normal sphincter tone, no lesions  Chaperone was present for exam.  Assessment:   Well woman visit  with normal exam. Status post A and P repairwith biological graftsand  midurethral sling Rectovaginal mass. Feels like a cyst and is not increasing in size. Likely from prior pelvic prolapse surgery.  Rectal pain.  FH of colon and GI cancer. Status post endometrial ablation.  Hx dysmenorrhea and menstrual headaches. Hx overactive bladder.Off medication due to urinary retention. Hx left breast fibroadenoma. Hx hyperthyroidism.  Hx low vit D.   Plan: Mammogram screening discussed.  She will update. Self breast awareness reviewed. Pap and HR HPV as above.  New guidelines discussed. Guidelines for Calcium, Vitamin D, regular exercise program including cardiovascular and weight bearing exercise. Routine labs today.  Referral to GI.  Follow up annually and prn.   After visit summary provided.

## 2019-10-11 ENCOUNTER — Ambulatory Visit (INDEPENDENT_AMBULATORY_CARE_PROVIDER_SITE_OTHER): Payer: No Typology Code available for payment source | Admitting: Obstetrics and Gynecology

## 2019-10-11 ENCOUNTER — Encounter: Payer: Self-pay | Admitting: Obstetrics and Gynecology

## 2019-10-11 ENCOUNTER — Other Ambulatory Visit: Payer: Self-pay

## 2019-10-11 VITALS — BP 116/72 | HR 76 | Temp 97.0°F | Resp 14 | Ht 63.5 in | Wt 130.8 lb

## 2019-10-11 DIAGNOSIS — Z0189 Encounter for other specified special examinations: Secondary | ICD-10-CM

## 2019-10-11 DIAGNOSIS — Z01419 Encounter for gynecological examination (general) (routine) without abnormal findings: Secondary | ICD-10-CM

## 2019-10-11 DIAGNOSIS — Z1211 Encounter for screening for malignant neoplasm of colon: Secondary | ICD-10-CM

## 2019-10-11 NOTE — Patient Instructions (Signed)

## 2019-10-12 LAB — CBC
Hematocrit: 40.3 % (ref 34.0–46.6)
Hemoglobin: 14 g/dL (ref 11.1–15.9)
MCH: 32.9 pg (ref 26.6–33.0)
MCHC: 34.7 g/dL (ref 31.5–35.7)
MCV: 95 fL (ref 79–97)
Platelets: 375 10*3/uL (ref 150–450)
RBC: 4.25 x10E6/uL (ref 3.77–5.28)
RDW: 13.2 % (ref 11.7–15.4)
WBC: 7.7 10*3/uL (ref 3.4–10.8)

## 2019-10-12 LAB — LIPID PANEL
Chol/HDL Ratio: 3.1 ratio (ref 0.0–4.4)
Cholesterol, Total: 221 mg/dL — ABNORMAL HIGH (ref 100–199)
HDL: 72 mg/dL (ref >39)
LDL Chol Calc (NIH): 123 mg/dL — ABNORMAL HIGH (ref 0–99)
Triglycerides: 151 mg/dL — ABNORMAL HIGH (ref 0–149)
VLDL Cholesterol Cal: 26 mg/dL (ref 5–40)

## 2019-10-12 LAB — COMPREHENSIVE METABOLIC PANEL
ALT: 9 IU/L (ref 0–32)
AST: 14 IU/L (ref 0–40)
Albumin/Globulin Ratio: 1.8 (ref 1.2–2.2)
Albumin: 4.3 g/dL (ref 3.8–4.8)
Alkaline Phosphatase: 57 IU/L (ref 39–117)
BUN/Creatinine Ratio: 21 (ref 9–23)
BUN: 13 mg/dL (ref 6–24)
Bilirubin Total: 0.2 mg/dL (ref 0.0–1.2)
CO2: 26 mmol/L (ref 20–29)
Calcium: 9.6 mg/dL (ref 8.7–10.2)
Chloride: 103 mmol/L (ref 96–106)
Creatinine, Ser: 0.62 mg/dL (ref 0.57–1.00)
GFR calc Af Amer: 122 mL/min/{1.73_m2} (ref >59)
GFR calc non Af Amer: 105 mL/min/{1.73_m2} (ref >59)
Globulin, Total: 2.4 g/dL (ref 1.5–4.5)
Glucose: 96 mg/dL (ref 65–99)
Potassium: 4.3 mmol/L (ref 3.5–5.2)
Sodium: 140 mmol/L (ref 134–144)
Total Protein: 6.7 g/dL (ref 6.0–8.5)

## 2019-10-12 LAB — TSH: TSH: 1.13 u[IU]/mL (ref 0.450–4.500)

## 2019-10-12 LAB — VITAMIN D 25 HYDROXY (VIT D DEFICIENCY, FRACTURES): Vit D, 25-Hydroxy: 25.2 ng/mL — ABNORMAL LOW (ref 30.0–100.0)

## 2019-10-16 ENCOUNTER — Other Ambulatory Visit: Payer: Self-pay

## 2019-10-16 ENCOUNTER — Ambulatory Visit: Payer: Self-pay | Admitting: *Deleted

## 2019-10-16 DIAGNOSIS — B351 Tinea unguium: Secondary | ICD-10-CM

## 2019-10-16 NOTE — Patient Instructions (Signed)

## 2019-10-16 NOTE — Progress Notes (Signed)
Patient presents today for the 1st laser treatment. Diagnosed with mycotic nail infection by Dr. Posey Pronto. Toenail most affected the 1st and 5th right and 3rd left. Patient is concerned about all the toenails, but her other nails look healthy.  All other systems are negative.  Nails were filed thin. Laser therapy was administered to 1-5 toenails bilateral and patient tolerated the treatment well. All safety precautions were in place.    Follow up in 4 weeks for laser # 2.  Picture of nails taken today to document visual progress

## 2019-10-23 ENCOUNTER — Telehealth: Payer: Self-pay

## 2019-10-23 NOTE — Telephone Encounter (Signed)
-----   Message from Nunzio Cobbs, MD sent at 10/15/2019  1:59 PM EDT ----- Results to patient through My Chart.  Marcene Brawn,   I have reviewed your blood work from your recent visit.  Your total cholesterol is a bit elevated from both a high good cholesterol, the HDL, and a slightly high bad cholesterol, the LDL cholesterol.  Your triglycerides are just barely elevated.  The ratios overall look good.  Your can lower cholesterol and triglycerides through a Mediterranean diet which includes more chicken and fish, whole grains, plenty of fruits and vegetables, and cooking oils instead of butter.  Increasing cardiovascular exercise will also help.  Your blood counts, blood chemistries, and thyroid function are normal.   Your vitamin D level continues to be slightly under the normal range.  I would recommending adding over the counter vitamin D3 1000 IU to your daily routine.   Please contact the office for any questions.   Have a good day!  Josefa Half, MD

## 2019-10-23 NOTE — Telephone Encounter (Signed)
Called patient and left message for her to review her MyChart message or to return my call to [place], [place] for results.

## 2019-10-26 NOTE — Telephone Encounter (Signed)
Patient reviewed lab results on MyChart 10-25-19.

## 2019-11-09 MED FILL — FLUTICASONE PROP 50 MCG SPR: 50 | 90 days supply | Qty: 48 | Fill #2

## 2019-11-09 MED FILL — VYVANSE 20 MG CAPSULE: 20 | 30 days supply | Qty: 30 | Fill #0

## 2019-11-09 MED FILL — OMEPRAZOLE DR 20 MG CAPSULE: 20 | 90 days supply | Qty: 90 | Fill #2

## 2019-11-13 ENCOUNTER — Other Ambulatory Visit: Payer: No Typology Code available for payment source

## 2019-12-12 MED FILL — VYVANSE 20 MG CAPSULE: 20 | 30 days supply | Qty: 30 | Fill #0

## 2020-01-09 ENCOUNTER — Telehealth: Payer: Self-pay | Admitting: Dermatology

## 2020-01-09 NOTE — Telephone Encounter (Signed)
Patient calling for appointment-referral from Dr. Carol Ada.  Scheduled for 02/21/2020 @ 11:45 with Lavonna Monarch, MD.  (Chart # [id number])

## 2020-01-17 MED FILL — VYVANSE 20 MG CAPSULE: 20 | 30 days supply | Qty: 30 | Fill #0

## 2020-02-07 ENCOUNTER — Other Ambulatory Visit (HOSPITAL_COMMUNITY): Payer: Self-pay | Admitting: Gastroenterology

## 2020-02-07 MED FILL — OMEPRAZOLE DR 20 MG CAPSULE: 20 | 90 days supply | Qty: 90 | Fill #0

## 2020-02-08 MED FILL — FLUTICASONE PROP 50 MCG SPR: 50 | 90 days supply | Qty: 48 | Fill #0

## 2020-02-21 ENCOUNTER — Ambulatory Visit: Payer: No Typology Code available for payment source | Admitting: Dermatology

## 2020-02-26 ENCOUNTER — Other Ambulatory Visit: Payer: Self-pay

## 2020-02-26 ENCOUNTER — Ambulatory Visit (INDEPENDENT_AMBULATORY_CARE_PROVIDER_SITE_OTHER): Payer: No Typology Code available for payment source | Admitting: Dermatology

## 2020-02-26 ENCOUNTER — Encounter: Payer: Self-pay | Admitting: Dermatology

## 2020-02-26 DIAGNOSIS — D229 Melanocytic nevi, unspecified: Secondary | ICD-10-CM

## 2020-02-26 DIAGNOSIS — D1801 Hemangioma of skin and subcutaneous tissue: Secondary | ICD-10-CM

## 2020-02-26 DIAGNOSIS — L821 Other seborrheic keratosis: Secondary | ICD-10-CM | POA: Diagnosis not present

## 2020-02-26 DIAGNOSIS — L816 Other disorders of diminished melanin formation: Secondary | ICD-10-CM

## 2020-02-26 DIAGNOSIS — D225 Melanocytic nevi of trunk: Secondary | ICD-10-CM

## 2020-02-26 DIAGNOSIS — Z1283 Encounter for screening for malignant neoplasm of skin: Secondary | ICD-10-CM

## 2020-02-26 NOTE — Progress Notes (Signed)
   Follow-Up Visit   Subjective  [name] is a 49 y.o. female who presents for the following: Annual Exam (RED SPOTS ALL OVER ANGIOMA?).  More red dots Location: All over especially chest Duration:  Quality:  Associated Signs/Symptoms: Modifying Factors:  Severity:  Timing: Context:   Objective  Well appearing patient in no apparent distress; mood and affect are within normal limits.  A focused examination was performed including All areas except pelvic girdle. Relevant physical exam findings are noted in the Assessment and Plan.   Assessment & Plan   Annual skin check today on [name].  She is concerned with noticing many new angiomas.  The largest smooth red dots are 4 mm on the outer breast but she has perhaps fifty 1 to 2 mm typical benign cherry angiomas that do not require watching or removal.  On the right thigh just below the leg crease and the upper sternum are seborrheic keratoses that are slightly raised.  Lower on the right thigh is a flat tan stucco keratosis. the macular (flat) tan spot on the left lower abdomen is a caf au lait spot.  She has a few skin tags.  [place]  She has a tiny little red dot on the tip of her nose which comes and goes which is either another angioma or telangiectasia; if this were to grow or bleed a biopsy would be indicated.  As healthy as Elizabeth's skin appears, she can return for general skin check in a year or on an as-needed basis.  Finally we discussed the several dozen 2 mm white macules on her legs which represent guttate hypomelanosis rather than a reaction to mosquito bites.  1 white dot on her left shin is a bit raised and may be a flat wart.  [name] knows she can call me if there are any questions.    I, Lavonna Monarch, MD, have reviewed all documentation for this visit.  The documentation on 04/04/20 for the exam, diagnosis, procedures, and orders are all accurate and  complete.  Hemangioma of skin Right Axilla  These do not currently require intervention  Nevus Mid Back  Self examine skin twice annually, dermatologist examination every 1 to 3 years or as needed  Seborrheic keratosis Right Thigh - Anterior  Leave if stable     I, Lavonna Monarch, MD, have reviewed all documentation for this visit.  The documentation on 04/04/20 for the exam, diagnosis, procedures, and orders are all accurate and complete.

## 2020-03-28 MED FILL — VYVANSE 20 MG CAPSULE: 20 | 30 days supply | Qty: 30 | Fill #0

## 2020-04-04 ENCOUNTER — Encounter: Payer: Self-pay | Admitting: Dermatology

## 2020-05-06 ENCOUNTER — Other Ambulatory Visit (HOSPITAL_COMMUNITY): Payer: Self-pay | Admitting: Otolaryngology

## 2020-05-07 ENCOUNTER — Ambulatory Visit: Payer: No Typology Code available for payment source | Attending: Internal Medicine

## 2020-05-07 MED FILL — levoFLOXacin 500 MG TABS: 500 | 7 days supply | Qty: 7 | Fill #0

## 2020-05-07 MED FILL — MOMETASONE FUROATE 0.1% CRM: 0.1 | 30 days supply | Qty: 45 | Fill #0

## 2020-05-07 MED FILL — predniSONE 10 MG TABS: 10 | 12 days supply | Qty: 48 | Fill #0

## 2020-05-07 MED FILL — VYVANSE 20 MG CAPSULE: 20 | 30 days supply | Qty: 30 | Fill #0

## 2020-05-21 ENCOUNTER — Encounter: Payer: Self-pay | Admitting: Obstetrics and Gynecology

## 2020-05-23 MED FILL — OMEPRAZOLE DR 20 MG CAPSULE: 20 | 90 days supply | Qty: 90 | Fill #1

## 2020-06-28 ENCOUNTER — Ambulatory Visit: Payer: Self-pay | Admitting: *Deleted

## 2020-06-28 ENCOUNTER — Ambulatory Visit: Payer: No Typology Code available for payment source

## 2020-06-28 NOTE — Telephone Encounter (Signed)
Patient is calling had her COVID vaccine booster today. She would like to know is there anything that she can take to avoid side effects? Last time, she had bad side effects from the COVID vaccine #2   Attempted to call patient to discuss treating vaccine SE- left message to call back  Answer Assessment - Initial Assessment Questions 1. MAIN CONCERN OR SYMPTOM:  "What is your main concern right now?" "What question do you have?" "What's the main symptom you're worried about?" (e.g., fever, pain, redness, swelling)     *No Answer* 2. VACCINE: "What vaccination did you receive?" "Is this your first or second shot?" (e.g., none; AstraZen[employer], J&J, [place], other)     *No Answer* 3. SYMPTOM ONSET: "When did the *No Answer* begin?" (e.g., not relevant; hours, days)      *No Answer* 4. SYMPTOM SEVERITY: "How bad is it?"      *No Answer* 5. FEVER: "Is there a fever?" If Yes, ask: "What is it, how was it measured, and when did it start?"      *No Answer* 6. PAST REACTIONS: "Have you reacted to immunizations before?" If Yes, ask: "What happened?"     *No Answer* 7. OTHER SYMPTOMS: "Do you have any other symptoms?"     *No Answer*  Protocols used: CORONAVIRUS (COVID-19) VACCINE QUESTIONS AND REACTIONS-A-AH

## 2020-06-28 NOTE — Telephone Encounter (Signed)
3 rd attempt to contact patient. No answer, left voicemail to call back at [phone].

## 2020-06-28 NOTE — Telephone Encounter (Signed)
Unable to reach patient after 3 attempts by NT. Will close out encounter.

## 2020-06-28 NOTE — Telephone Encounter (Signed)
Second attempt to call patient- left message to call back 

## 2020-07-02 ENCOUNTER — Other Ambulatory Visit (HOSPITAL_COMMUNITY): Payer: Self-pay | Admitting: Family Medicine

## 2020-07-02 MED FILL — VYVANSE 20 MG CAPSULE: 20 | 30 days supply | Qty: 30 | Fill #0

## 2020-07-31 ENCOUNTER — Other Ambulatory Visit (HOSPITAL_COMMUNITY): Payer: Self-pay | Admitting: Otolaryngology

## 2020-07-31 MED FILL — predniSONE 10 MG TABS: 10 | 12 days supply | Qty: 48 | Fill #0

## 2020-08-01 MED FILL — VYVANSE 20 MG CAPSULE: 20 | 30 days supply | Qty: 30 | Fill #0

## 2020-08-02 ENCOUNTER — Other Ambulatory Visit (HOSPITAL_COMMUNITY): Payer: Self-pay | Admitting: Otolaryngology

## 2020-08-02 DIAGNOSIS — J329 Chronic sinusitis, unspecified: Secondary | ICD-10-CM

## 2020-08-16 ENCOUNTER — Other Ambulatory Visit: Payer: Self-pay

## 2020-08-16 ENCOUNTER — Ambulatory Visit (HOSPITAL_COMMUNITY)
Admission: RE | Admit: 2020-08-16 | Discharge: 2020-08-16 | Disposition: A | Discharge status: Home / Self Care | Payer: No Typology Code available for payment source | Source: Ambulatory Visit | Attending: Otolaryngology | Admitting: Otolaryngology

## 2020-08-16 DIAGNOSIS — J329 Chronic sinusitis, unspecified: Secondary | ICD-10-CM | POA: Diagnosis present

## 2020-09-09 MED FILL — OMEPRAZOLE DR 20 MG CAPSULE: 20 | 90 days supply | Qty: 90 | Fill #2

## 2020-09-09 MED FILL — VYVANSE 20 MG CAPSULE: 20 | 30 days supply | Qty: 30 | Fill #0

## 2020-10-08 ENCOUNTER — Other Ambulatory Visit (HOSPITAL_COMMUNITY): Payer: Self-pay | Admitting: Family Medicine

## 2020-10-25 DIAGNOSIS — U071 COVID-19: Secondary | ICD-10-CM

## 2020-10-25 HISTORY — DX: COVID-19: U07.1

## 2020-11-06 ENCOUNTER — Other Ambulatory Visit (HOSPITAL_COMMUNITY): Payer: Self-pay

## 2020-11-06 MED ORDER — VYVANSE 30 MG PO CAPS
30.0000 mg | ORAL_CAPSULE | Freq: Every morning | ORAL | 0 refills | Status: DC
  Filled 2020-11-06: qty 30, 30d supply, fill #0

## 2020-11-07 ENCOUNTER — Other Ambulatory Visit (HOSPITAL_COMMUNITY): Payer: Self-pay

## 2020-11-12 NOTE — Progress Notes (Deleted)
50 y.o. G38P1001 Single Caucasian female here for annual exam.    PCP:     No LMP recorded. Patient has had an ablation.           Sexually active: {yes no:314532}  The current method of family planning is Novasure--female partner.    Exercising: {yes no:314532}  {types:19826} Smoker:  no  Health Maintenance: Pap: 09-15-17 Neg:Neg HR HPV, 04-24-15 Neg:Neg HR HPV.07-31-13 Neg:Neg HR HPV History of abnormal Pap:  no MMG:  05-21-20 3D/Neg/Birads1 Colonoscopy:  *** BMD:   n/a  Result  n/a TDaP:  09-15-17 Gardasil:   no HIV:09-15-17 NR Hep C:09-15-17 Neg Screening Labs:  Hb today: ***, Urine today: ***   reports that she has never smoked. She has never used smokeless tobacco. She reports current alcohol use. She reports that she does not use drugs.  Past Medical History:  Diagnosis Date  . GERD (gastroesophageal reflux disease)   . Headache(784.0)    otc meds prn  . Hyperthyroidism    no meds  . Irregular heart beat    history - no treatment  Dr Wynonia Lawman  . Low vitamin D level   . PONV (postoperative nausea and vomiting)   . Right ovarian cyst 2016   CT scan - 1 cm, possible dermoid.  . Sinus tachycardia   . Thrombocytosis ([place])   . Urinary incontinence    urgency and night urination  . Vaginal cyst 2019   posterior vaginal wall - 8 mm    Past Surgical History:  Procedure Laterality Date  . ANTERIOR AND POSTERIOR REPAIR     with biological grafts  . BREAST SURGERY  04/07/2016   Lt.Br.Bx--Fibroadenoma  . DIAGNOSTIC LAPAROSCOPY    . DILATION AND CURETTAGE OF UTERUS    . ENDOMETRIAL ABLATION  05/2011  . INCONTINENCE SURGERY     [facility] midurethral sling and cystoscopy  . LAPAROSCOPY  06/12/2011   Procedure: LAPAROSCOPY OPERATIVE;  Surgeon: Arloa Koh;  Location: [facility];  Service: Gynecology;  Laterality: N/A;   Left Ovarian Cystectomy  . NOSE SURGERY    . SHOULDER SURGERY     bilateral left x 2, right x 1 surgery  . svd      x 1  . UPPER GASTROINTESTINAL ENDOSCOPY     . WISDOM TOOTH EXTRACTION      Current Outpatient Medications  Medication Sig Dispense Refill  . amphetamine-dextroamphetamine (ADDERALL) 10 MG tablet Take 10 mg by mouth 2 (two) times daily.  0  . cetirizine (ZYRTEC) 10 MG tablet Take 10 mg by mouth daily.    . Cyanocobalamin (VITAMIN B12 PO) Take 1 each by mouth as needed. gummie     . cyclobenzaprine (FLEXERIL) 10 MG tablet Take 10 mg by mouth as needed.  0  . diclofenac sodium (VOLTAREN) 1 % GEL as needed.  0  . fluticasone (FLONASE) 50 MCG/ACT nasal spray Place 2 sprays into both nostrils daily.  3  . fluticasone (FLONASE) 50 MCG/ACT nasal spray Place 2 sprays into both nostrils daily for 10 days. 16 g 0  . levofloxacin (LEVAQUIN) 500 MG tablet TAKE 1 TABLET BY MOUTH DAILY FOR 7 DAYS 7 tablet 0  . lisdexamfetamine (VYVANSE) 20 MG capsule TAKE 1 CAPSULE BY MOUTH EVERY MORNING 30 capsule 0  . lisdexamfetamine (VYVANSE) 20 MG capsule TAKE 1 CAPSULE BY MOUTH EVERY MORNING 30 capsule 0  . lisdexamfetamine (VYVANSE) 20 MG capsule TAKE 1 CAPSULE BY MOUTH EVERY MORNING *** DO NOT FILL UNTIL 08/31/20 30 capsule  0  . lisdexamfetamine (VYVANSE) 30 MG capsule TAKE 1 CAPSULE BY MOUTH ONCE DAILY IN THE MORNING 30 capsule 0  . lisdexamfetamine (VYVANSE) 30 MG capsule Take 1 capsule (30 mg total) by mouth every morning. 30 capsule 0  . Magnesium 100 MG CAPS magnesium    . methocarbamol (ROBAXIN) 500 MG tablet as needed.    . mometasone (ELOCON) 0.1 % cream APPLY TOPICALLY DAILY 45 g 3  . Multiple Vitamins-Minerals (MULTIVITAMIN WITH MINERALS) tablet Take 1 tablet by mouth daily.      Marland Kitchen omeprazole (PRILOSEC) 20 MG capsule omeprazole 20 mg capsule,delayed release    . omeprazole (PRILOSEC) 20 MG capsule TAKE 1 CAPSULE BY MOUTH ONCE A DAY 90 capsule 2  . omeprazole-sodium bicarbonate (ZEGERID) 40-1100 MG per capsule Take 1 capsule by mouth daily before breakfast.      . oxyCODONE-acetaminophen (PERCOCET/ROXICET) 5-325 MG tablet Take 1 tablet by mouth  every 4 (four) hours. Prn pain  0  . predniSONE (DELTASONE) 10 MG tablet TAKE 6 TABLETS BY MOUTH DAILY FOR 4 DAYS,THEN 4 TABS DAILY FOR 4 DAYS,THEN 2 TABS DAILY FOR 4 DAYS,THEN STOP. 48 tablet 0  . predniSONE (DELTASONE) 10 MG tablet TAKE 6 TABLETS BY MOUTH DAILY FOR 4 DAYS,THEN 4 TABS DAILY FOR 4 DAYS,THEN 2 TABS DAILY FOR 4 DAYS,THEN STOP 48 tablet 0  . VYVANSE 20 MG capsule Take 20 mg by mouth daily.  0   No current facility-administered medications for this visit.    Family History  Problem Relation Age of Onset  . Fibromyalgia Mother   . Thyroid disease Mother   . Seizures Father        d/t head injury  . Cancer Father        ?stomach ca  . Cancer Paternal Grandmother        colon  . Hypertension Paternal Grandmother   . Stroke Paternal Grandmother   . Diabetes Paternal Aunt   . Hypertension Maternal Grandmother   . Thyroid disease Maternal Grandmother     Review of Systems  Exam:   There were no vitals taken for this visit.    General appearance: alert, cooperative and appears stated age Head: normocephalic, without obvious abnormality, atraumatic Neck: no adenopathy, supple, symmetrical, trachea midline and thyroid normal to inspection and palpation Lungs: clear to auscultation bilaterally Breasts: normal appearance, no masses or tenderness, No nipple retraction or dimpling, No nipple discharge or bleeding, No axillary adenopathy Heart: regular rate and rhythm Abdomen: soft, non-tender; no masses, no organomegaly Extremities: extremities normal, atraumatic, no cyanosis or edema Skin: skin color, texture, turgor normal. No rashes or lesions Lymph nodes: cervical, supraclavicular, and axillary nodes normal. Neurologic: grossly normal  Pelvic: External genitalia:  no lesions              No abnormal inguinal nodes palpated.              Urethra:  normal appearing urethra with no masses, tenderness or lesions              Bartholins and Skenes: normal                  Vagina: normal appearing vagina with normal color and discharge, no lesions              Cervix: no lesions              Pap taken: {yes no:314532} Bimanual Exam:  Uterus:  normal size, contour, position, consistency, mobility, non-tender  Adnexa: no mass, fullness, tenderness              Rectal exam: {yes no:314532}.  Confirms.              Anus:  normal sphincter tone, no lesions  Chaperone was present for exam.  Assessment:   Well woman visit with normal exam.   Plan: Mammogram screening discussed. Self breast awareness reviewed. Pap and HR HPV as above. Guidelines for Calcium, Vitamin D, regular exercise program including cardiovascular and weight bearing exercise.   Follow up annually and prn.   Additional counseling given.  {yes [id number]. _______ minutes face to face time of which over 50% was spent in counseling.    After visit summary provided.

## 2020-11-13 ENCOUNTER — Ambulatory Visit: Payer: No Typology Code available for payment source | Admitting: Obstetrics and Gynecology

## 2020-11-22 ENCOUNTER — Other Ambulatory Visit (HOSPITAL_COMMUNITY): Payer: Self-pay

## 2020-11-22 MED ORDER — CARESTART COVID-19 HOME TEST VI KIT
PACK | 0 refills | Status: DC
  Filled 2020-11-22: qty 4, 4d supply, fill #0

## 2020-11-22 MED ORDER — AZITHROMYCIN 250 MG PO TABS
ORAL_TABLET | ORAL | 0 refills | Status: AC
  Filled 2020-11-22: qty 6, 5d supply, fill #0

## 2020-11-22 MED ORDER — PREDNISONE 10 MG PO TABS
ORAL_TABLET | ORAL | 0 refills | Status: AC
  Filled 2020-11-22: qty 21, 6d supply, fill #0

## 2020-11-22 MED ORDER — MONTELUKAST SODIUM 10 MG PO TABS
10.0000 mg | ORAL_TABLET | Freq: Every day | ORAL | 0 refills | Status: DC
  Filled 2020-11-22: qty 30, 30d supply, fill #0

## 2020-12-13 ENCOUNTER — Other Ambulatory Visit (HOSPITAL_COMMUNITY): Payer: Self-pay

## 2020-12-13 ENCOUNTER — Other Ambulatory Visit (HOSPITAL_COMMUNITY): Payer: Self-pay | Admitting: Gastroenterology

## 2020-12-14 ENCOUNTER — Other Ambulatory Visit (HOSPITAL_COMMUNITY): Payer: Self-pay

## 2020-12-14 ENCOUNTER — Other Ambulatory Visit (HOSPITAL_COMMUNITY): Payer: Self-pay | Admitting: Gastroenterology

## 2020-12-16 ENCOUNTER — Other Ambulatory Visit (HOSPITAL_COMMUNITY): Payer: Self-pay

## 2020-12-16 MED ORDER — VYVANSE 30 MG PO CAPS
30.0000 mg | ORAL_CAPSULE | Freq: Every morning | ORAL | 0 refills | Status: DC
  Filled 2020-12-16: qty 30, 30d supply, fill #0

## 2020-12-17 ENCOUNTER — Other Ambulatory Visit (HOSPITAL_COMMUNITY): Payer: Self-pay

## 2020-12-17 MED ORDER — OMEPRAZOLE 20 MG PO CPDR
20.0000 mg | DELAYED_RELEASE_CAPSULE | Freq: Every day | ORAL | 2 refills | Status: DC
  Filled 2020-12-17: qty 90, 90d supply, fill #0
  Filled 2021-02-04 – 2021-03-28 (×2): qty 90, 90d supply, fill #1
  Filled 2021-06-20: qty 90, 90d supply, fill #2

## 2021-02-04 ENCOUNTER — Other Ambulatory Visit (HOSPITAL_COMMUNITY): Payer: Self-pay

## 2021-02-04 MED ORDER — VYVANSE 30 MG PO CAPS
30.0000 mg | ORAL_CAPSULE | Freq: Every morning | ORAL | 0 refills | Status: DC
  Filled 2021-03-28: qty 30, 30d supply, fill #0

## 2021-02-04 MED ORDER — VYVANSE 30 MG PO CAPS
30.0000 mg | ORAL_CAPSULE | ORAL | 0 refills | Status: DC
  Filled 2021-02-04: qty 30, 30d supply, fill #0

## 2021-02-26 ENCOUNTER — Ambulatory Visit: Payer: No Typology Code available for payment source | Admitting: Dermatology

## 2021-03-18 ENCOUNTER — Other Ambulatory Visit (HOSPITAL_COMMUNITY): Payer: Self-pay

## 2021-03-18 MED ORDER — PREDNISONE 20 MG PO TABS
20.0000 mg | ORAL_TABLET | Freq: Two times a day (BID) | ORAL | 0 refills | Status: AC
  Filled 2021-03-18: qty 14, 7d supply, fill #0

## 2021-03-18 MED ORDER — FLUTICASONE PROPIONATE 50 MCG/ACT NA SUSP
2.0000 | Freq: Every day | NASAL | 3 refills | Status: DC
  Filled 2021-03-18: qty 48, 90d supply, fill #0

## 2021-03-19 ENCOUNTER — Other Ambulatory Visit (HOSPITAL_COMMUNITY): Payer: Self-pay

## 2021-03-27 ENCOUNTER — Other Ambulatory Visit (HOSPITAL_COMMUNITY): Payer: Self-pay

## 2021-03-27 MED ORDER — PREDNISONE 20 MG PO TABS
40.0000 mg | ORAL_TABLET | Freq: Every day | ORAL | 0 refills | Status: DC
  Filled 2021-03-27: qty 20, 10d supply, fill #0

## 2021-03-28 ENCOUNTER — Other Ambulatory Visit (HOSPITAL_COMMUNITY): Payer: Self-pay

## 2021-03-28 MED ORDER — QUICKVUE AT-HOME COVID-19 TEST VI KIT
PACK | 0 refills | Status: DC
  Filled 2021-03-28: qty 2, 2d supply, fill #0

## 2021-04-08 ENCOUNTER — Other Ambulatory Visit (HOSPITAL_COMMUNITY): Payer: Self-pay

## 2021-04-08 MED ORDER — CARESTART COVID-19 HOME TEST VI KIT
PACK | 0 refills | Status: DC
  Filled 2021-04-08: qty 2, 2d supply, fill #0

## 2021-04-09 ENCOUNTER — Ambulatory Visit: Payer: No Typology Code available for payment source | Admitting: Sports Medicine

## 2021-04-09 ENCOUNTER — Other Ambulatory Visit (HOSPITAL_COMMUNITY): Payer: Self-pay

## 2021-04-09 ENCOUNTER — Other Ambulatory Visit: Payer: Self-pay

## 2021-04-09 VITALS — BP 110/70 | HR 112 | Ht 63.5 in | Wt 131.0 lb

## 2021-04-09 DIAGNOSIS — R079 Chest pain, unspecified: Secondary | ICD-10-CM | POA: Diagnosis not present

## 2021-04-09 DIAGNOSIS — M9903 Segmental and somatic dysfunction of lumbar region: Secondary | ICD-10-CM

## 2021-04-09 DIAGNOSIS — M9901 Segmental and somatic dysfunction of cervical region: Secondary | ICD-10-CM

## 2021-04-09 DIAGNOSIS — M9902 Segmental and somatic dysfunction of thoracic region: Secondary | ICD-10-CM | POA: Diagnosis not present

## 2021-04-09 DIAGNOSIS — M9905 Segmental and somatic dysfunction of pelvic region: Secondary | ICD-10-CM | POA: Diagnosis not present

## 2021-04-09 DIAGNOSIS — M9908 Segmental and somatic dysfunction of rib cage: Secondary | ICD-10-CM | POA: Diagnosis not present

## 2021-04-09 MED ORDER — MELOXICAM 15 MG PO TABS
15.0000 mg | ORAL_TABLET | Freq: Every day | ORAL | 0 refills | Status: DC
  Filled 2021-04-09: qty 30, 30d supply, fill #0

## 2021-04-09 NOTE — Patient Instructions (Addendum)
Good to see you  Meloxicam '15mg'$  daily for 2-3 weeks with food  Tried OMT See back as needed

## 2021-04-09 NOTE — Progress Notes (Signed)
Benito Mccreedy D.[place] Phone: [phone]   Assessment and Plan:     1. Right-sided chest pain  2. Somatic dysfunction of thoracic region  3. Somatic dysfunction of rib region  4. Somatic dysfunction of pelvic region  5. Somatic dysfunction of cervical region  6. Somatic dysfunction of lumbar region -Acute with uncertain prognosis, initial sports medicine visit - Persistent right-sided chest pain consistent with costochondritis, however patient has not improved with use of 2 courses of prednisone p.o., muscle relaxers, rest - Patient had somatic dysfunction listed below that may have been an underlying 3 aggravating factor for costochondritis.  These findings were fixed using OMT - Start meloxicam 15 mg daily for 2 to 3 weeks.  May use remaining as needed for pain control - No red flag symptoms today including no shortness of breath, chest pain, lower extremity swelling/redness/pain  Other orders - meloxicam (MOBIC) 15 MG tablet; Take 1 tablet (15 mg total) by mouth daily.    Decision today to treat with OMT was based on Physical Exam   After verbal consent patient was treated with HVLA, ME, ST, FPR techniques in cervical, rib, thoracic, lumbar, and pelvic areas. Patient tolerated the procedure well with improvement in symptoms.  Patient educated on potential side effects of soreness and recommended to rest, hydrate, and use Tylenol as needed for pain control.   Pertinent previous records reviewed include x-ray report, primary care note, PM&R note   Follow Up: As needed if no improvement or worsening of symptoms   Subjective:    I, Judy Pimple, am serving as a Education administrator for Dr. Glennon Mac   Chief Complaint: Chest wall pain    HPI: 50 year old femal presenting with chest wall pain X3 weeks   04/09/21 Patient states that she is having Right sided chest wall pain that is worse by taking deep breaths or  twisting motion. Pain will radiate around to the R side of back. No MOI. Any type of sneezing, deep breaths, cough will make the pain worse. Prednisone helps but it doesn't go away, patient has tried tylenol, ibuprofen, bio freeze and Voltaren gel.     Additional pertinent review of systems negative.  Current Outpatient Medications  Medication Sig Dispense Refill   cetirizine (ZYRTEC) 10 MG tablet Take 10 mg by mouth daily.     COVID-19 At Home Antigen Test [facility] COVID-19 HOME TEST) KIT Use as directed 2 each 0   Cyanocobalamin (VITAMIN B12 PO) Take 1 each by mouth as needed. gummie      cyclobenzaprine (FLEXERIL) 10 MG tablet Take 10 mg by mouth as needed.  0   diclofenac sodium (VOLTAREN) 1 % GEL as needed.  0   fluticasone (FLONASE) 50 MCG/ACT nasal spray Place 2 sprays into both nostrils daily.  3   lisdexamfetamine (VYVANSE) 30 MG capsule Take 1 capsule (30 mg total) by mouth every morning. 30 capsule 0   lisdexamfetamine (VYVANSE) 30 MG capsule Take 1 capsule (30 mg total) by mouth in the morning. 30 capsule 0   Magnesium 100 MG CAPS magnesium     meloxicam (MOBIC) 15 MG tablet Take 1 tablet (15 mg total) by mouth daily. 30 tablet 0   mometasone (ELOCON) 0.1 % cream APPLY TOPICALLY DAILY 45 g 3   montelukast (SINGULAIR) 10 MG tablet Take 1 tablet (10 mg total) by mouth daily. 30 tablet 0   Multiple Vitamins-Minerals (MULTIVITAMIN WITH MINERALS) tablet Take  1 tablet by mouth daily.       omeprazole-sodium bicarbonate (ZEGERID) 40-1100 MG per capsule Take 1 capsule by mouth daily before breakfast.       oxyCODONE-acetaminophen (PERCOCET/ROXICET) 5-325 MG tablet Take 1 tablet by mouth every 4 (four) hours. Prn pain (Patient not taking: Reported on 04/09/2021)  0   No current facility-administered medications for this visit.      Objective:     Vitals:   04/09/21 1502  BP: 110/70  Pulse: (!) 112  SpO2: 97%  Weight: 131 lb (59.4 kg)  Height: 5' 3.5" (1.613 m)      Body mass  index is 22.84 kg/m.    Physical Exam:     General: Well-appearing, cooperative, sitting comfortably in no acute distress.   OMT Physical Exam:  ASIS Compression Test: Positive Right Cervical: NTTP paraspinal, full ROM without pain Rib: Bilateral elevated first rib with TTP, mild dysfunction with inhalation of ribs 5-7 on right Thoracic: TTP paraspinal, RRSL T8-12 Lumbar: TTT paraspinal, RRSL L1-3 Pelvis: Right anterior innominate   OMT abbreviations: HVLA (high velocity low amplitude), ME (muscle energy), FPR (flex positional release), ST (soft tissue), TTP (tender to palpation),  Electronically signed by:  Benito Mccreedy D.[facility] 3:45 PM 04/09/21

## 2021-04-11 ENCOUNTER — Other Ambulatory Visit (HOSPITAL_COMMUNITY): Payer: Self-pay

## 2021-04-11 MED ORDER — CARESTART COVID-19 HOME TEST VI KIT
PACK | 0 refills | Status: DC
  Filled 2021-04-11: qty 2, 2d supply, fill #0

## 2021-05-13 ENCOUNTER — Other Ambulatory Visit (HOSPITAL_COMMUNITY): Payer: Self-pay

## 2021-05-13 MED ORDER — VYVANSE 30 MG PO CAPS
30.0000 mg | ORAL_CAPSULE | Freq: Every morning | ORAL | 0 refills | Status: DC
  Filled 2021-05-13: qty 30, 30d supply, fill #0

## 2021-05-16 ENCOUNTER — Other Ambulatory Visit (HOSPITAL_COMMUNITY): Payer: Self-pay

## 2021-05-16 MED ORDER — CARESTART COVID-19 HOME TEST VI KIT
PACK | 0 refills | Status: DC
  Filled 2021-05-16: qty 4, 4d supply, fill #0

## 2021-06-12 ENCOUNTER — Other Ambulatory Visit (HOSPITAL_COMMUNITY): Payer: Self-pay

## 2021-06-16 ENCOUNTER — Other Ambulatory Visit (HOSPITAL_COMMUNITY): Payer: Self-pay

## 2021-06-17 ENCOUNTER — Other Ambulatory Visit: Payer: Self-pay

## 2021-06-17 ENCOUNTER — Other Ambulatory Visit (HOSPITAL_BASED_OUTPATIENT_CLINIC_OR_DEPARTMENT_OTHER): Payer: Self-pay | Admitting: Family Medicine

## 2021-06-17 ENCOUNTER — Other Ambulatory Visit: Payer: Self-pay | Admitting: Family Medicine

## 2021-06-17 ENCOUNTER — Other Ambulatory Visit (HOSPITAL_COMMUNITY): Payer: Self-pay | Admitting: Family Medicine

## 2021-06-17 ENCOUNTER — Ambulatory Visit (HOSPITAL_BASED_OUTPATIENT_CLINIC_OR_DEPARTMENT_OTHER)
Admission: RE | Admit: 2021-06-17 | Discharge: 2021-06-17 | Disposition: A | Discharge status: Home / Self Care | Payer: No Typology Code available for payment source | Source: Ambulatory Visit | Attending: Family Medicine | Admitting: Family Medicine

## 2021-06-17 ENCOUNTER — Encounter (HOSPITAL_BASED_OUTPATIENT_CLINIC_OR_DEPARTMENT_OTHER): Payer: Self-pay

## 2021-06-17 ENCOUNTER — Other Ambulatory Visit (HOSPITAL_COMMUNITY): Payer: Self-pay

## 2021-06-17 DIAGNOSIS — R0781 Pleurodynia: Secondary | ICD-10-CM

## 2021-06-17 LAB — POCT I-STAT CREATININE: Creatinine, Ser: 0.8 mg/dL (ref 0.44–1.00)

## 2021-06-17 MED ORDER — IOHEXOL 350 MG/ML SOLN
60.0000 mL | Freq: Once | INTRAVENOUS | Status: AC | PRN
  Administered 2021-06-17: 60 mL via INTRAVENOUS

## 2021-06-17 MED ORDER — MELOXICAM 15 MG PO TABS
15.0000 mg | ORAL_TABLET | Freq: Every day | ORAL | 1 refills | Status: AC
  Filled 2021-06-17: qty 30, 30d supply, fill #0
  Filled 2021-07-09 – 2022-01-29 (×2): qty 30, 30d supply, fill #1

## 2021-06-17 MED ORDER — VYVANSE 30 MG PO CAPS
30.0000 mg | ORAL_CAPSULE | Freq: Every morning | ORAL | 0 refills | Status: DC
  Filled 2021-06-17: qty 30, 30d supply, fill #0

## 2021-06-18 ENCOUNTER — Other Ambulatory Visit (HOSPITAL_COMMUNITY): Payer: Self-pay

## 2021-06-18 MED ORDER — FLUTICASONE PROPIONATE 50 MCG/ACT NA SUSP
2.0000 | Freq: Every day | NASAL | 1 refills | Status: DC
  Filled 2021-06-18 – 2021-12-08 (×2): qty 48, 90d supply, fill #0
  Filled 2022-02-26: qty 48, 90d supply, fill #1

## 2021-06-20 ENCOUNTER — Other Ambulatory Visit (HOSPITAL_COMMUNITY): Payer: Self-pay

## 2021-06-27 ENCOUNTER — Other Ambulatory Visit (HOSPITAL_COMMUNITY): Payer: Self-pay

## 2021-06-30 ENCOUNTER — Other Ambulatory Visit (HOSPITAL_COMMUNITY): Payer: Self-pay

## 2021-07-09 ENCOUNTER — Telehealth: Payer: No Typology Code available for payment source | Admitting: Physician Assistant

## 2021-07-09 ENCOUNTER — Other Ambulatory Visit (HOSPITAL_COMMUNITY): Payer: Self-pay

## 2021-07-09 DIAGNOSIS — B9689 Other specified bacterial agents as the cause of diseases classified elsewhere: Secondary | ICD-10-CM | POA: Diagnosis not present

## 2021-07-09 DIAGNOSIS — J019 Acute sinusitis, unspecified: Secondary | ICD-10-CM | POA: Diagnosis not present

## 2021-07-09 MED ORDER — MONTELUKAST SODIUM 10 MG PO TABS
10.0000 mg | ORAL_TABLET | Freq: Every day | ORAL | 0 refills | Status: AC
  Filled 2021-07-09: qty 30, 30d supply, fill #0

## 2021-07-09 MED ORDER — DOXYCYCLINE HYCLATE 100 MG PO TABS
100.0000 mg | ORAL_TABLET | Freq: Two times a day (BID) | ORAL | 0 refills | Status: DC
  Filled 2021-07-09: qty 20, 10d supply, fill #0

## 2021-07-09 MED ORDER — PREDNISONE 20 MG PO TABS
40.0000 mg | ORAL_TABLET | Freq: Every day | ORAL | 0 refills | Status: DC
  Filled 2021-07-09: qty 10, 5d supply, fill #0

## 2021-07-09 NOTE — Progress Notes (Signed)
E-Visit for Sinus Problems  We are sorry that you are not feeling well.  Here is how we plan to help!  Based on what you have shared with me it looks like you have sinusitis.  Sinusitis is inflammation and infection in the sinus cavities of the head.  Based on your presentation I believe you most likely have Acute Bacterial Sinusitis.  This is an infection caused by bacteria and is treated with antibiotics. I have prescribed Doxycycline 100mg  by mouth twice a day for 10 days. You may use an oral decongestant such as Mucinex D or if you have glaucoma or high blood pressure use plain Mucinex. Saline nasal spray help and can safely be used as often as needed for congestion.  If you develop worsening sinus pain, fever or notice severe headache and vision changes, or if symptoms are not better after completion of antibiotic, please schedule an appointment with a health care provider.    I will send in a burst of prednisone to take as directed daily until completed.  Sinus infections are not as easily transmitted as other respiratory infection, however we still recommend that you avoid close contact with loved ones, especially the very young and elderly.  Remember to wash your hands thoroughly throughout the day as this is the number one way to prevent the spread of infection!  Home Care: Only take medications as instructed by your medical team. Complete the entire course of an antibiotic. Do not take these medications with alcohol. A steam or ultrasonic humidifier can help congestion.  You can place a towel over your head and breathe in the steam from hot water coming from a faucet. Avoid close contacts especially the very young and the elderly. Cover your mouth when you cough or sneeze. Always remember to wash your hands.  Get Help Right Away If: You develop worsening fever or sinus pain. You develop a severe head ache or visual changes. Your symptoms persist after you have completed your treatment  plan.  Make sure you Understand these instructions. Will watch your condition. Will get help right away if you are not doing well or get worse.  Thank you for choosing an e-visit.  Your e-visit answers were reviewed by a board certified advanced clinical practitioner to complete your personal care plan. Depending upon the condition, your plan could have included both over the counter or prescription medications.  Please review your pharmacy choice. Make sure the pharmacy is open so you can pick up prescription now. If there is a problem, you may contact your provider through [employer] and have the prescription routed to another pharmacy.  Your safety is important to [place]. If you have drug allergies check your prescription carefully.   For the next 24 hours you can use MyChart to ask questions about today's visit, request a non-urgent call back, or ask for a work or school excuse. You will get an email in the next two days asking about your experience. I hope that your e-visit has been valuable and will speed your recovery.

## 2021-07-09 NOTE — Progress Notes (Signed)
I have spent 5 minutes in review of e-visit questionnaire, review and updating patient chart, medical decision making and response to patient.   Pamila Mendibles Cody Jeramine Delis, PA-C    

## 2021-08-11 ENCOUNTER — Encounter: Payer: Self-pay | Admitting: Obstetrics and Gynecology

## 2021-08-11 ENCOUNTER — Ambulatory Visit (INDEPENDENT_AMBULATORY_CARE_PROVIDER_SITE_OTHER): Payer: No Typology Code available for payment source | Admitting: Obstetrics and Gynecology

## 2021-08-11 ENCOUNTER — Other Ambulatory Visit: Payer: Self-pay

## 2021-08-11 VITALS — BP 108/64 | HR 81 | Ht 63.0 in | Wt 132.0 lb

## 2021-08-11 DIAGNOSIS — Z01419 Encounter for gynecological examination (general) (routine) without abnormal findings: Secondary | ICD-10-CM | POA: Diagnosis not present

## 2021-08-11 NOTE — Patient Instructions (Signed)

## 2021-08-11 NOTE — Progress Notes (Signed)
51 y.o. G65P1001 Single Caucasian female here for annual exam.    No vaginal bleeding.  Hx ablation.  Some hot flashes mostly at night.  Feels like her bladder has dropped more.  No recurrent infections.  Can double void at times.  No leak with cough, lifting. Voids often to avoid this.  Takes a probiotic for bowel function.   Still with rectal spasms, can last to 30 minutes, and always occur at night.  Sitting and pushing pressure on it helps it to ease off.  Treating constipation helps this.   Interested in having a bone density due to long term Zegrid use.  Had Covid in April 2022.   See Dr. Tamala Julian for her routine medical care and labs.   PCP:  Carol Ada, MD   No LMP recorded. Patient has had an ablation.           Sexually active: Yes.    The current method of family planning is Novasure--female partner.    Exercising: No.  Weights and walking Smoker:  no  Health Maintenance: Pap:  09-15-17 Neg:Neg HR HPV, 04-24-15 Neg:Neg HR HPV, 07-31-13 Neg:Neg HR HPV History of abnormal Pap:  no MMG:  05-21-20 Neg/BiRads1--has appt Colonoscopy: 2022 polyp;next 5 years Dr. Watt Climes BMD:   n/a  Result  n/a TDaP:  09-15-17 Gardasil:   no HIV: 09-15-17 NR Hep C: 09-15-17 Neg Screening Labs:  Covid vaccine:  one booster.  Flu vaccine:  completed.    reports that she has never smoked. She has never used smokeless tobacco. She reports current alcohol use. She reports that she does not use drugs.  Past Medical History:  Diagnosis Date   COVID 10/2020   GERD (gastroesophageal reflux disease)    Headache(784.0)    otc meds prn   Hyperthyroidism    no meds   Irregular heart beat    history - no treatment  Dr Wynonia Lawman   Low vitamin D level    PONV (postoperative nausea and vomiting)    Right ovarian cyst 2016   CT scan - 1 cm, possible dermoid.   Sinus tachycardia    Thrombocytosis    Urinary incontinence    urgency and night urination   Vaginal cyst 2019   posterior vaginal wall  - 8 mm    Past Surgical History:  Procedure Laterality Date   ANTERIOR AND POSTERIOR REPAIR     with biological grafts   BREAST SURGERY  04/07/2016   Lt.Br.Bx--Fibroadenoma   DIAGNOSTIC LAPAROSCOPY     DILATION AND CURETTAGE OF UTERUS     ENDOMETRIAL ABLATION  05/2011   INCONTINENCE SURGERY     [facility] midurethral sling and cystoscopy   LAPAROSCOPY  06/12/2011   Procedure: LAPAROSCOPY OPERATIVE;  Surgeon: Arloa Koh;  Location: [facility];  Service: Gynecology;  Laterality: N/A;   Left Ovarian Cystectomy   NOSE SURGERY     SHOULDER SURGERY     bilateral left x 2, right x 1 surgery   svd      x 1   UPPER GASTROINTESTINAL ENDOSCOPY     WISDOM TOOTH EXTRACTION      Current Outpatient Medications  Medication Sig Dispense Refill   cetirizine (ZYRTEC) 10 MG tablet Take 10 mg by mouth daily.     Cyanocobalamin (VITAMIN B12 PO) Take 1 each by mouth as needed. gummie      cyclobenzaprine (FLEXERIL) 10 MG tablet Take 10 mg by mouth as needed.  0   diclofenac sodium (VOLTAREN) 1 %  GEL as needed.  0   fluticasone (FLONASE) 50 MCG/ACT nasal spray Place 2 sprays into both nostrils daily.  3   fluticasone (FLONASE) 50 MCG/ACT nasal spray Place 2 sprays into both nostrils daily. 48 g 1   lisdexamfetamine (VYVANSE) 30 MG capsule Take 1 capsule (30 mg total) by mouth every morning. 30 capsule 0   lisdexamfetamine (VYVANSE) 30 MG capsule Take 1 capsule (30 mg total) by mouth in the morning. 30 capsule 0   Magnesium 100 MG CAPS magnesium     meloxicam (MOBIC) 15 MG tablet Take 1 tablet (15 mg total) by mouth daily. 30 tablet 1   montelukast (SINGULAIR) 10 MG tablet Take 1 tablet (10 mg total) by mouth daily. 30 tablet 0   Multiple Vitamins-Minerals (MULTIVITAMIN WITH MINERALS) tablet Take 1 tablet by mouth daily.       omeprazole (PRILOSEC) 20 MG capsule Take 1 capsule by mouth daily.     omeprazole-sodium bicarbonate (ZEGERID) 40-1100 MG per capsule Take 1 capsule by mouth daily before breakfast.        oxyCODONE-acetaminophen (PERCOCET/ROXICET) 5-325 MG tablet Take 1 tablet by mouth every 4 (four) hours. Prn pain (Patient not taking: Reported on 04/09/2021)  0   No current facility-administered medications for this visit.    Family History  Problem Relation Age of Onset   Fibromyalgia Mother    Thyroid disease Mother    Seizures Father        d/t head injury   Cancer Father        ?stomach ca   Cancer Paternal Grandmother        colon   Hypertension Paternal Grandmother    Stroke Paternal Grandmother    Diabetes Paternal Aunt    Hypertension Maternal Grandmother    Thyroid disease Maternal Grandmother     Review of Systems  All other systems reviewed and are negative.  Exam:   BP 108/64    Pulse 81    Ht 5\' 3"  (1.6 m)    Wt 132 lb (59.9 kg)    SpO2 98%    BMI 23.38 kg/m     General appearance: alert, cooperative and appears stated age Head: normocephalic, without obvious abnormality, atraumatic Neck: no adenopathy, supple, symmetrical, trachea midline and thyroid normal to inspection and palpation Lungs: clear to auscultation bilaterally Breasts: normal appearance, no masses or tenderness, No nipple retraction or dimpling, No nipple discharge or bleeding, No axillary adenopathy Heart: regular rate and rhythm Abdomen: soft, non-tender; no masses, no organomegaly Extremities: extremities normal, atraumatic, no cyanosis or edema Skin: skin color, texture, turgor normal. No rashes or lesions Lymph nodes: cervical, supraclavicular, and axillary nodes normal. Neurologic: grossly normal  Pelvic: External genitalia:  no lesions              No abnormal inguinal nodes palpated.              Urethra:  normal appearing urethra with no masses, tenderness or lesions              Bartholins and Skenes: normal                 Vagina: normal appearing vagina with normal color and discharge, no lesions.  First degree cystocele and first degree rectocele.               Cervix: no  lesions              Pap taken: no Bimanual Exam:  Uterus:  normal size, contour, position, consistency, mobility, non-tender              Adnexa: no mass, fullness, tenderness              Rectal exam: Yes.  .  Confirms.  7 - 8 mm cystic mass of rectovaginal septum.                Anus:  normal sphincter tone, no lesions  Chaperone was present for exam:   Estill Bamberg, CMA.  Assessment:   Well woman visit with gynecologic exam. Status post A and P repair with biological grafts and midurethral sling Rectovaginal mass.  Feels like a cyst and is not increasing in size. May be related to prior prolapse surgical repair or just a vaginal cyst.   Rectal spasms.  Constipation related.  FH of colon and GI cancer. Status post endometrial ablation.  Menopausal symptoms.  Hx overactive bladder. Off medication due to urinary retention. Hx left breast fibroadenoma.  Hx hyperthyroidism.  No medication.  Osteoporosis screening.   Long term Zegrid use.  Plan: Mammogram screening discussed. Self breast awareness reviewed. Pap and HR HPV as above. Guidelines for Calcium, Vitamin D, regular exercise program including cardiovascular and weight bearing exercise. BMD at [facility].  Order will be faxed to them and patient will call to schedule.  Labs with PCP.  Follow up annually and prn.   After visit summary provided.

## 2021-09-03 ENCOUNTER — Other Ambulatory Visit (HOSPITAL_COMMUNITY): Payer: Self-pay

## 2021-09-04 ENCOUNTER — Other Ambulatory Visit (HOSPITAL_COMMUNITY): Payer: Self-pay

## 2021-09-04 MED ORDER — VYVANSE 30 MG PO CAPS
30.0000 mg | ORAL_CAPSULE | Freq: Every morning | ORAL | 0 refills | Status: DC
  Filled 2021-09-04: qty 30, 30d supply, fill #0

## 2021-09-05 ENCOUNTER — Other Ambulatory Visit (HOSPITAL_COMMUNITY): Payer: Self-pay

## 2021-09-09 ENCOUNTER — Other Ambulatory Visit (HOSPITAL_COMMUNITY): Payer: Self-pay

## 2021-09-10 ENCOUNTER — Other Ambulatory Visit (HOSPITAL_COMMUNITY): Payer: Self-pay

## 2021-09-10 MED ORDER — OMEPRAZOLE 20 MG PO CPDR
20.0000 mg | DELAYED_RELEASE_CAPSULE | Freq: Every day | ORAL | 0 refills | Status: DC
  Filled 2021-09-10: qty 90, 90d supply, fill #0

## 2021-10-10 ENCOUNTER — Ambulatory Visit: Payer: No Typology Code available for payment source | Admitting: Cardiovascular Disease

## 2021-10-10 ENCOUNTER — Other Ambulatory Visit (HOSPITAL_COMMUNITY): Payer: Self-pay

## 2021-10-10 ENCOUNTER — Encounter: Payer: Self-pay | Admitting: Cardiovascular Disease

## 2021-10-10 ENCOUNTER — Encounter: Payer: Self-pay | Admitting: *Deleted

## 2021-10-10 ENCOUNTER — Other Ambulatory Visit: Payer: Self-pay

## 2021-10-10 VITALS — BP 96/56 | HR 85 | Ht 63.0 in | Wt 134.0 lb

## 2021-10-10 DIAGNOSIS — I3139 Other pericardial effusion (noninflammatory): Secondary | ICD-10-CM | POA: Diagnosis not present

## 2021-10-10 DIAGNOSIS — R072 Precordial pain: Secondary | ICD-10-CM | POA: Diagnosis not present

## 2021-10-10 DIAGNOSIS — R079 Chest pain, unspecified: Secondary | ICD-10-CM | POA: Diagnosis not present

## 2021-10-10 MED ORDER — IVABRADINE HCL 7.5 MG PO TABS
15.0000 mg | ORAL_TABLET | Freq: Once | ORAL | 0 refills | Status: AC
  Filled 2021-10-10: qty 2, 1d supply, fill #0

## 2021-10-10 MED ORDER — PREDNISONE 50 MG PO TABS
ORAL_TABLET | ORAL | 0 refills | Status: DC
  Filled 2021-10-10: qty 3, 1d supply, fill #0

## 2021-10-10 MED ORDER — DIPHENHYDRAMINE HCL 50 MG PO CAPS
ORAL_CAPSULE | ORAL | 0 refills | Status: AC
  Filled 2021-10-10: qty 1, fill #0

## 2021-10-10 NOTE — Progress Notes (Signed)
Chief Complaint  Patient presents with   New Patient (Initial Visit)    Chest pain   History of Present Illness: 51 yo female with history of GERD, hyperthyroidism, IBS, SVT here today as a new patient for the evaluation of chest pain. Chest CTA November 2022 with trace pericardial effusion, no PE. She has no known CAD. Remote history of SVT but no recent palpitations. She tells me that she has central chest pain that occurs several times per week. The pain is never exertional. The pain can last for several hours. She had Covid-19 in April 2022 and her pain started one month later. She has never been a smoker. No family history of premature CAD.  She works for [employer]. She is a Engineer, civil (consulting).   Primary Care Physician: Merri Brunette, MD  Past Medical History:  Diagnosis Date   Abdominal pain    ADHD    Allergic rhinitis    Benign neoplasm of colon    Chronic fatigue    Constipation    COVID 10/2020   GERD (gastroesophageal reflux disease)    Headache(784.0)    otc meds prn   Hearing loss    Hiatal hernia    Hyperthyroidism    no meds   IBS (irritable bowel syndrome)    Irregular heart beat    history - no treatment  Dr Donnie Aho   Low vitamin D level    Polyp of stomach and duodenum    PONV (postoperative nausea and vomiting)    Rectal pain    Right ovarian cyst 2016   CT scan - 1 cm, possible dermoid.   Sinus tachycardia    SVT (supraventricular tachycardia) (HCC)    Thrombocytosis    Urinary incontinence    urgency and night urination   Vaginal cyst 2019   posterior vaginal wall - 8 mm   Vaginal cyst     Past Surgical History:  Procedure Laterality Date   ANTERIOR AND POSTERIOR REPAIR     with biological grafts   BREAST SURGERY  04/07/2016   Lt.Br.Bx--Fibroadenoma   DIAGNOSTIC LAPAROSCOPY     DILATION AND CURETTAGE OF UTERUS     ENDOMETRIAL ABLATION  05/2011   INCONTINENCE SURGERY     [facility] midurethral sling and cystoscopy   LAPAROSCOPY  06/12/2011   Procedure:  LAPAROSCOPY OPERATIVE;  Surgeon: Melony Overly;  Location: [facility];  Service: Gynecology;  Laterality: N/A;   Left Ovarian Cystectomy   NOSE SURGERY     SHOULDER SURGERY     bilateral left x 2, right x 1 surgery   svd      x 1   UPPER GASTROINTESTINAL ENDOSCOPY     WISDOM TOOTH EXTRACTION      Current Outpatient Medications  Medication Sig Dispense Refill   cetirizine (ZYRTEC) 10 MG tablet Take 10 mg by mouth daily.     Cyanocobalamin (VITAMIN B12 PO) Take 1 each by mouth as needed. gummie      cyclobenzaprine (FLEXERIL) 10 MG tablet Take 10 mg by mouth as needed.  0   diclofenac sodium (VOLTAREN) 1 % GEL as needed.  0   diphenhydrAMINE (BENADRYL) 50 MG capsule Take one capsule 1 hour prior to scan. 1 capsule 0   fluticasone (FLONASE) 50 MCG/ACT nasal spray Place 2 sprays into both nostrils daily.  3   ivabradine (CORLANOR) 7.5 MG TABS tablet Take 2 tablets (15 mg total) by mouth once for 1 dose. Take 90-120 minutes prior to scan. 2 tablet  0   lisdexamfetamine (VYVANSE) 30 MG capsule Take 1 capsule (30 mg total) by mouth every morning. 30 capsule 0   Magnesium 100 MG CAPS magnesium     meloxicam (MOBIC) 15 MG tablet Take 1 tablet (15 mg total) by mouth daily. 30 tablet 1   montelukast (SINGULAIR) 10 MG tablet Take 1 tablet (10 mg total) by mouth daily. 30 tablet 0   Multiple Vitamins-Minerals (MULTIVITAMIN WITH MINERALS) tablet Take 1 tablet by mouth daily.       omeprazole (PRILOSEC) 20 MG capsule Take 1 capsule (20 mg total) by mouth daily. 90 capsule 0   omeprazole-sodium bicarbonate (ZEGERID) 40-1100 MG per capsule Take 1 capsule by mouth daily before breakfast.       oxyCODONE-acetaminophen (PERCOCET/ROXICET) 5-325 MG tablet Take 1 tablet by mouth every 4 (four) hours. Prn pain  0   predniSONE (DELTASONE) 50 MG tablet Take one tablet 13 hours, 7 hours, and 1 hour prior to scan. 3 tablet 0   fluticasone (FLONASE) 50 MCG/ACT nasal spray Place 2 sprays into both nostrils daily. (Patient  not taking: Reported on 10/10/2021) 48 g 1   lisdexamfetamine (VYVANSE) 30 MG capsule Take 1 capsule (30 mg total) by mouth every morning. (Patient not taking: Reported on 10/10/2021) 30 capsule 0   omeprazole (PRILOSEC) 20 MG capsule Take 1 capsule by mouth daily. (Patient not taking: Reported on 10/10/2021)     No current facility-administered medications for this visit.    Allergies  Allergen Reactions   Gadolinium Derivatives    Augmentin [Amoxicillin-Pot Clavulanate] Nausea And Vomiting   Buprenorphine Hcl Hives   Codeine    Iodinated Contrast Media Hives   Iohexol      Code: HIVES, Desc: HIVES-MULTIHANCE, Onset Date: [id number]    Morphine And Related Hives   Vicodin [Hydrocodone-Acetaminophen] Hives    Social History   Socioeconomic History   Marital status: Single    Spouse name: Not on file   Number of children: 1   Years of education: Not on file   Highest education level: Not on file  Occupational History   Occupation: Nurse with [employer]  Tobacco Use   Smoking status: Never   Smokeless tobacco: Never  Vaping Use   Vaping Use: Never used  Substance and Sexual Activity   Alcohol use: Yes    Alcohol/week: 0.0 standard drinks    Comment: only occasionally--once a month   Drug use: No   Sexual activity: Yes    Partners: Female    Comment: ablation-Novasure  Other Topics Concern   Not on file  Social History Narrative   Not on file   Social Determinants of Health   Financial Resource Strain: Not on file  Food Insecurity: Not on file  Transportation Needs: Not on file  Physical Activity: Not on file  Stress: Not on file  Social Connections: Not on file  Intimate Partner Violence: Not on file    Family History  Problem Relation Age of Onset   Fibromyalgia Mother    Thyroid disease Mother    Seizures Father        d/t head injury   Cancer Father        ?stomach ca   Cancer Paternal Grandmother        colon   Hypertension Paternal Grandmother     Stroke Paternal Grandmother    Diabetes Paternal Aunt    Hypertension Maternal Grandmother    Thyroid disease Maternal Grandmother     Review of  Systems:  As stated in the HPI and otherwise negative.   BP (!) 96/56   Pulse 85   Ht 5\' 3"  (1.6 m)   Wt 134 lb (60.8 kg)   SpO2 99%   BMI 23.74 kg/m   Physical Examination: General: Well developed, well nourished, NAD  HEENT: OP clear, mucus membranes moist  SKIN: warm, dry. No rashes. Neuro: No focal deficits  Musculoskeletal: Muscle strength 5/5 all ext  Psychiatric: Mood and affect normal  Neck: No JVD, no carotid bruits, no thyromegaly, no lymphadenopathy.  Lungs:Clear bilaterally, no wheezes, rhonci, crackles Cardiovascular: Regular rate and rhythm. No murmurs, gallops or rubs. Abdomen:Soft. Bowel sounds present. Non-tender.  Extremities: No lower extremity edema. Pulses are 2 + in the bilateral DP/PT.  EKG:  EKG is ordered today. The ekg ordered today demonstrates Sinus  Recent Labs: 06/17/2021: Creatinine, Ser 0.80   Lipid Panel    Component Value Date/Time   CHOL 221 (H) 10/11/2019 1124   TRIG 151 (H) 10/11/2019 1124   HDL 72 10/11/2019 1124   CHOLHDL 3.1 10/11/2019 1124   CHOLHDL 4.2 04/24/2015 1023   VLDL 20 04/24/2015 1023   LDLCALC 123 (H) 10/11/2019 1124     Wt Readings from Last 3 Encounters:  10/10/21 134 lb (60.8 kg)  08/11/21 132 lb (59.9 kg)  04/09/21 131 lb (59.4 kg)    Assessment and Plan:   1. Chest pain: Her chest pain has been coming and going for months. Her pain is in the center or left side of her chest. This also makes her dyspneic. Will arrange a coronary CTA to exclude obstructive CAD. BMET today. Pre-treatment for possible contrast allergy.   2. Pericardial effusion: Trivial pericardial effusion on CT chest. Will arrange echo to better assess.   Labs/ tests ordered today include:   Orders Placed This Encounter  Procedures   CT CORONARY MORPH W/CTA COR W/SCORE W/CA W/CM &/OR WO/CM    Basic Metabolic Panel (BMET)   EKG 12-Lead   ECHOCARDIOGRAM COMPLETE   Disposition:   F/U with me as needed.   Signed, Verne Carrow, MD 10/10/2021 2:36 PM    [facility] [address] [place], [place], [place]  [address] Phone: [phone]; Fax: [phone]

## 2021-10-10 NOTE — Patient Instructions (Addendum)
Medication Instructions:  ?No changes ?*If you need a refill on your cardiac medications before your next appointment, please call your pharmacy* ? ? ?Lab Work: ?Today: BMET ? ? ?Testing/Procedures: ?Your physician has requested that you have an echocardiogram. Echocardiography is a painless test that uses sound waves to create images of your heart. It provides your doctor with information about the size and shape of your heart and how well your heart?s chambers and valves are working. This procedure takes approximately one hour. There are no restrictions for this procedure. ? ?Cardiac CTA - see instructions below ? ? ?Follow-Up: ?As needed ? ? ?Other Instructions ? ? ?Your cardiac CT will be scheduled at  ?[facility] ?[address] ?[place], [place] [address] ?[phone] ? ?Please arrive at the [facility] and Children's Entrance (Entrance C2) of [facility] 30 minutes prior to test start time. ?You can use the FREE valet parking offered at entrance C (encouraged to control the heart rate for the test)  ?Proceed to the [facility] Radiology Department (first floor) to check-in and test prep. ? ?All radiology patients and guests should use entrance C2 at [facility], accessed from [facility], even though the hospital's physical address listed is [address] ? ? ? ? ?Please follow these instructions carefully (unless otherwise directed): ? ?On the Night Before the Test: ?Be sure to Drink plenty of water. ?Do not consume any caffeinated/decaffeinated beverages or chocolate 12 hours prior to your test. ?Do not take any antihistamines 12 hours prior to your test. ?Contrast allergy: ?Patient will need a prescription for Prednisone and very clear instructions (as follows): ?Prednisone 50 mg - take 13 hours prior to test ?Take another Prednisone 50 mg 7 hours prior to test ?Take another Prednisone 50 mg 1 hour prior to test ?Take Benadryl 50 mg 1 hour prior to  test ?Patient must complete all four doses of above prophylactic medications. ?Patient will need a ride after test due to Benadryl. ? ?On the Day of the Test: ?Drink plenty of water until 1 hour prior to the test. ?Do not eat any food 4 hours prior to the test. ?You may take your regular medications prior to the test.  ?Take ivabradine (Corlanor) two hours prior to test. ?FEMALES- please wear underwire-free bra if available, avoid dresses & tight clothing ? ?     ?After the Test: ?Drink plenty of water. ?After receiving IV contrast, you may experience a mild flushed feeling. This is normal. ?On occasion, you may experience a mild rash up to 24 hours after the test. This is not dangerous. If this occurs, you can take Benadryl 25 mg and increase your fluid intake. ?If you experience trouble breathing, this can be serious. If it is severe call 911 IMMEDIATELY. If it is mild, please call our office. ?If you take any of these medications: Glipizide/Metformin, Avandament, Glucavance, please do not take 48 hours after completing test unless otherwise instructed. ? ?We will call to schedule your test 2-4 weeks out understanding that some insurance companies will need an authorization prior to the service being performed.  ? ?For non-scheduling related questions, please contact the cardiac imaging nurse navigator should you have any questions/concerns: ?Marchia Bond, Cardiac Imaging Nurse Navigator ?Gordy Clement, Cardiac Imaging Nurse Navigator ?[facility] ?Direct Office Dial: [phone]  ? ?For scheduling needs, including cancellations and rescheduling, please call [place], [phone]. ?  ?

## 2021-10-11 LAB — BASIC METABOLIC PANEL
BUN/Creatinine Ratio: 19 (ref 9–23)
BUN: 13 mg/dL (ref 6–24)
CO2: 24 mmol/L (ref 20–29)
Calcium: 9.8 mg/dL (ref 8.7–10.2)
Chloride: 102 mmol/L (ref 96–106)
Creatinine, Ser: 0.7 mg/dL (ref 0.57–1.00)
Glucose: 95 mg/dL (ref 70–99)
Potassium: 4.7 mmol/L (ref 3.5–5.2)
Sodium: 141 mmol/L (ref 134–144)
eGFR: 104 mL/min/{1.73_m2} (ref >59)

## 2021-10-22 ENCOUNTER — Ambulatory Visit (HOSPITAL_COMMUNITY): Payer: No Typology Code available for payment source | Attending: Cardiology

## 2021-10-22 ENCOUNTER — Telehealth (HOSPITAL_COMMUNITY): Payer: Self-pay | Admitting: *Deleted

## 2021-10-22 DIAGNOSIS — R079 Chest pain, unspecified: Secondary | ICD-10-CM | POA: Diagnosis not present

## 2021-10-22 DIAGNOSIS — I3139 Other pericardial effusion (noninflammatory): Secondary | ICD-10-CM | POA: Insufficient documentation

## 2021-10-22 DIAGNOSIS — R072 Precordial pain: Secondary | ICD-10-CM | POA: Diagnosis present

## 2021-10-22 LAB — ECHOCARDIOGRAM COMPLETE
Area-P 1/2: 2.97 cm2
S' Lateral: 2.7 cm

## 2021-10-22 NOTE — Telephone Encounter (Signed)
Reaching out to patient to offer assistance regarding upcoming cardiac imaging study; pt verbalizes understanding of appt date/time, parking situation and where to check in, pre-test NPO status and medications ordered, and verified current allergies; name and call back number provided for further questions should they arise ? ?Gordy Clement RN Navigator Cardiac Imaging ?[facility] ?[phone] office ?[phone] cell ? ?Patient to take '15mg'$  ivabradine two hours prior to her cardiac CT scan.  She is aware to arrive at 11am for her 11:30am scan. ?

## 2021-10-24 ENCOUNTER — Telehealth: Payer: Self-pay | Admitting: Cardiovascular Disease

## 2021-10-24 ENCOUNTER — Ambulatory Visit (HOSPITAL_COMMUNITY)
Admission: RE | Admit: 2021-10-24 | Discharge: 2021-10-24 | Disposition: A | Discharge status: Home / Self Care | Payer: No Typology Code available for payment source | Source: Ambulatory Visit | Attending: Cardiovascular Disease | Admitting: Cardiovascular Disease

## 2021-10-24 DIAGNOSIS — R079 Chest pain, unspecified: Secondary | ICD-10-CM | POA: Diagnosis present

## 2021-10-24 DIAGNOSIS — R072 Precordial pain: Secondary | ICD-10-CM | POA: Insufficient documentation

## 2021-10-24 MED ORDER — IOHEXOL 350 MG/ML SOLN
100.0000 mL | Freq: Once | INTRAVENOUS | Status: AC | PRN
  Administered 2021-10-24: 100 mL via INTRAVENOUS

## 2021-10-24 MED ORDER — NITROGLYCERIN 0.4 MG SL SUBL
SUBLINGUAL_TABLET | SUBLINGUAL | Status: AC
  Filled 2021-10-24: qty 2

## 2021-10-24 MED ORDER — NITROGLYCERIN 0.4 MG SL SUBL
0.8000 mg | SUBLINGUAL_TABLET | Freq: Once | SUBLINGUAL | Status: AC
  Administered 2021-10-24: 0.8 mg via SUBLINGUAL

## 2021-10-24 NOTE — Telephone Encounter (Signed)
Patient returning a phone call... please advise  ?

## 2021-10-24 NOTE — Telephone Encounter (Signed)
Echo results reviewed with patient.

## 2021-10-27 ENCOUNTER — Other Ambulatory Visit (HOSPITAL_COMMUNITY): Payer: Self-pay

## 2021-10-28 ENCOUNTER — Other Ambulatory Visit (HOSPITAL_COMMUNITY): Payer: Self-pay

## 2021-10-28 MED ORDER — VYVANSE 30 MG PO CAPS
30.0000 mg | ORAL_CAPSULE | Freq: Every morning | ORAL | 0 refills | Status: DC
Start: 2021-10-28 — End: 2021-12-09
  Filled 2021-10-28: qty 30, 30d supply, fill #0

## 2021-11-21 ENCOUNTER — Ambulatory Visit (INDEPENDENT_AMBULATORY_CARE_PROVIDER_SITE_OTHER): Payer: Self-pay

## 2021-11-21 DIAGNOSIS — B351 Tinea unguium: Secondary | ICD-10-CM

## 2021-11-21 NOTE — Patient Instructions (Signed)

## 2021-11-21 NOTE — Progress Notes (Signed)
Patient presents today for the 1st (restart) laser treatment. Diagnosed with mycotic nail infection by Dr. Posey Pronto.  ? ?Toenail most affected 3rd right and 3rd left. ? ?All other systems are negative. ? ?Nails were filed thin. Laser therapy was administered to 1-5 toenails bilateral and patient tolerated the treatment well. All safety precautions were in place.  ? ? ?Follow up in 4 weeks for laser # 2. ? ?Picture of nails taken today to document visual progress  ?

## 2021-12-03 ENCOUNTER — Other Ambulatory Visit (HOSPITAL_BASED_OUTPATIENT_CLINIC_OR_DEPARTMENT_OTHER): Payer: Self-pay

## 2021-12-03 MED ORDER — COVID-19 AT HOME ANTIGEN TEST VI KIT
PACK | 0 refills | Status: DC
  Filled 2021-12-03: qty 4, 8d supply, fill #0

## 2021-12-08 ENCOUNTER — Other Ambulatory Visit (HOSPITAL_COMMUNITY): Payer: Self-pay

## 2021-12-08 MED ORDER — OMEPRAZOLE 20 MG PO CPDR
20.0000 mg | DELAYED_RELEASE_CAPSULE | Freq: Every day | ORAL | 3 refills | Status: AC
  Filled 2021-12-08: qty 90, 90d supply, fill #0
  Filled 2022-02-26: qty 90, 90d supply, fill #1
  Filled 2022-06-03: qty 90, 90d supply, fill #2
  Filled 2022-09-07: qty 90, 90d supply, fill #3

## 2021-12-09 ENCOUNTER — Other Ambulatory Visit (HOSPITAL_COMMUNITY): Payer: Self-pay

## 2021-12-09 MED ORDER — VYVANSE 30 MG PO CAPS
30.0000 mg | ORAL_CAPSULE | Freq: Every morning | ORAL | 0 refills | Status: DC
Start: 2021-12-09 — End: 2021-12-29
  Filled 2021-12-09: qty 30, 30d supply, fill #0

## 2021-12-23 ENCOUNTER — Ambulatory Visit (INDEPENDENT_AMBULATORY_CARE_PROVIDER_SITE_OTHER): Payer: No Typology Code available for payment source

## 2021-12-23 DIAGNOSIS — B351 Tinea unguium: Secondary | ICD-10-CM

## 2021-12-23 NOTE — Progress Notes (Unsigned)
Patient presents today for the 2nd (restart) laser treatment. Diagnosed with mycotic nail infection by Dr. Posey Pronto.   Toenail most affected 3rd right and 3rd left.  All other systems are negative.  Nails were filed thin. Laser therapy was administered to 1-5 toenails bilateral and patient tolerated the treatment well. All safety precautions were in place.    Follow up in 4 weeks for laser # 3.  Picture of nails taken today to document visual progress

## 2021-12-26 ENCOUNTER — Other Ambulatory Visit (HOSPITAL_COMMUNITY): Payer: Self-pay

## 2021-12-29 ENCOUNTER — Other Ambulatory Visit (HOSPITAL_COMMUNITY): Payer: Self-pay

## 2021-12-29 MED ORDER — VYVANSE 30 MG PO CAPS
30.0000 mg | ORAL_CAPSULE | Freq: Every morning | ORAL | 0 refills | Status: DC
Start: 2021-12-26 — End: 2022-10-08
  Filled 2021-12-29 – 2022-01-29 (×2): qty 30, 30d supply, fill #0

## 2022-01-19 ENCOUNTER — Ambulatory Visit (INDEPENDENT_AMBULATORY_CARE_PROVIDER_SITE_OTHER): Payer: No Typology Code available for payment source | Admitting: *Deleted

## 2022-01-19 DIAGNOSIS — B351 Tinea unguium: Secondary | ICD-10-CM

## 2022-01-29 ENCOUNTER — Other Ambulatory Visit (HOSPITAL_COMMUNITY): Payer: Self-pay

## 2022-02-26 ENCOUNTER — Other Ambulatory Visit (HOSPITAL_COMMUNITY): Payer: Self-pay

## 2022-02-27 ENCOUNTER — Other Ambulatory Visit (HOSPITAL_COMMUNITY): Payer: Self-pay

## 2022-02-27 MED ORDER — VYVANSE 30 MG PO CAPS
30.0000 mg | ORAL_CAPSULE | Freq: Every morning | ORAL | 0 refills | Status: DC
Start: 2022-03-02 — End: 2022-04-17
  Filled 2022-02-27 – 2022-03-02 (×2): qty 30, 30d supply, fill #0

## 2022-03-02 ENCOUNTER — Other Ambulatory Visit (HOSPITAL_COMMUNITY): Payer: Self-pay

## 2022-04-13 ENCOUNTER — Other Ambulatory Visit (HOSPITAL_COMMUNITY): Payer: Self-pay

## 2022-04-14 ENCOUNTER — Other Ambulatory Visit (HOSPITAL_COMMUNITY): Payer: Self-pay

## 2022-04-15 ENCOUNTER — Other Ambulatory Visit (HOSPITAL_COMMUNITY): Payer: Self-pay

## 2022-04-17 ENCOUNTER — Other Ambulatory Visit (HOSPITAL_COMMUNITY): Payer: Self-pay

## 2022-04-17 MED ORDER — LISDEXAMFETAMINE DIMESYLATE 30 MG PO CAPS
30.0000 mg | ORAL_CAPSULE | Freq: Every morning | ORAL | 0 refills | Status: DC
Start: 2022-04-17 — End: 2022-05-28
  Filled 2022-04-17: qty 30, 30d supply, fill #0

## 2022-05-28 ENCOUNTER — Other Ambulatory Visit (HOSPITAL_COMMUNITY): Payer: Self-pay

## 2022-05-28 MED ORDER — LISDEXAMFETAMINE DIMESYLATE 30 MG PO CAPS
30.0000 mg | ORAL_CAPSULE | Freq: Every day | ORAL | 0 refills | Status: DC
  Filled 2022-05-28: qty 30, 30d supply, fill #0

## 2022-06-03 ENCOUNTER — Other Ambulatory Visit (HOSPITAL_COMMUNITY): Payer: Self-pay

## 2022-06-04 ENCOUNTER — Other Ambulatory Visit (HOSPITAL_COMMUNITY): Payer: Self-pay

## 2022-06-22 ENCOUNTER — Encounter: Payer: Self-pay | Admitting: Obstetrics and Gynecology

## 2022-06-25 ENCOUNTER — Other Ambulatory Visit (HOSPITAL_COMMUNITY): Payer: Self-pay

## 2022-06-25 MED ORDER — LISDEXAMFETAMINE DIMESYLATE 30 MG PO CAPS
30.0000 mg | ORAL_CAPSULE | Freq: Every morning | ORAL | 0 refills | Status: DC
Start: 2022-06-24 — End: 2022-10-08
  Filled 2022-06-26 – 2022-07-01 (×3): qty 30, 30d supply, fill #0

## 2022-06-26 ENCOUNTER — Other Ambulatory Visit (HOSPITAL_COMMUNITY): Payer: Self-pay

## 2022-07-01 ENCOUNTER — Other Ambulatory Visit (HOSPITAL_COMMUNITY): Payer: Self-pay

## 2022-07-17 ENCOUNTER — Encounter: Payer: Self-pay | Admitting: Obstetrics and Gynecology

## 2022-07-28 ENCOUNTER — Encounter: Payer: Self-pay | Admitting: Obstetrics and Gynecology

## 2022-07-29 DIAGNOSIS — R922 Inconclusive mammogram: Secondary | ICD-10-CM | POA: Diagnosis not present

## 2022-07-30 ENCOUNTER — Encounter: Payer: Self-pay | Admitting: Obstetrics and Gynecology

## 2022-08-03 ENCOUNTER — Other Ambulatory Visit (HOSPITAL_COMMUNITY): Payer: Self-pay

## 2022-08-03 ENCOUNTER — Telehealth: Payer: Self-pay

## 2022-08-03 MED ORDER — LISDEXAMFETAMINE DIMESYLATE 30 MG PO CAPS
30.0000 mg | ORAL_CAPSULE | ORAL | 0 refills | Status: DC
  Filled 2022-08-03: qty 30, 30d supply, fill #0

## 2022-08-03 NOTE — Telephone Encounter (Signed)
Please work her in with me tomorrow or this week so I can help with her menopausal symptoms.

## 2022-08-03 NOTE — Telephone Encounter (Signed)
Patient had previously sent a message about possible HRT due to hotflashes and night sweats keeping her up at night.  She did her breast imaging and finished that in December.    She cannot get an appointment for AEX Until 08/27/22 and said she is pretty miserable.  She asked if she had to wait until then or if you would prescribe for her prior to visit.  We have an appt open tomorrow for problem visit. Would you want to see her tomorrow for HRT and she can keep AEX 08/27/22?

## 2022-08-03 NOTE — Telephone Encounter (Signed)
Spoke with patient and I have her scheduled for 2:15pm tomorrow afternoon.

## 2022-08-04 ENCOUNTER — Other Ambulatory Visit (HOSPITAL_COMMUNITY): Payer: Self-pay

## 2022-08-04 ENCOUNTER — Ambulatory Visit (INDEPENDENT_AMBULATORY_CARE_PROVIDER_SITE_OTHER): Payer: 59 | Admitting: Obstetrics and Gynecology

## 2022-08-04 ENCOUNTER — Encounter: Payer: Self-pay | Admitting: Obstetrics and Gynecology

## 2022-08-04 VITALS — BP 118/74 | Ht 63.0 in | Wt 126.0 lb

## 2022-08-04 DIAGNOSIS — N951 Menopausal and female climacteric states: Secondary | ICD-10-CM | POA: Diagnosis not present

## 2022-08-04 MED ORDER — PROGESTERONE MICRONIZED 100 MG PO CAPS
100.0000 mg | ORAL_CAPSULE | Freq: Every day | ORAL | 0 refills | Status: DC
  Filled 2022-08-04: qty 90, 90d supply, fill #0

## 2022-08-04 MED ORDER — ESTRADIOL 0.05 MG/24HR TD PTTW
1.0000 | MEDICATED_PATCH | TRANSDERMAL | 0 refills | Status: DC
  Filled 2022-08-04: qty 24, 84d supply, fill #0

## 2022-08-04 NOTE — Progress Notes (Signed)
GYNECOLOGY  VISIT   HPI: 52 y.o.   Single  Caucasian  female   [id number] with No LMP recorded. Patient has had an ablation.   here for discuss HRT  Not sleeping due to hot flashes.  Notes daytime increased heat as well.  Feeling tired.  Some cognitive change.  Hx endometrial ablation, so no menstrual period for a while.   GYNECOLOGIC HISTORY: No LMP recorded. Patient has had an ablation. Contraception:  ablation Menopausal hormone therapy:  n/a Last mammogram:  07/29/22 Breast Density category C, BI-RADS CATEGORY 2 Benign Last pap smear:   09/15/17 negative: HR HPV neg        OB History     Gravida  1   Para  1   Term  1   Preterm      AB      Living  1      SAB      IAB      Ectopic      Multiple      Live Births                 Patient Active Problem List   Diagnosis Date Noted   Rectal pain 12/15/2017   Pain at injection site 10/01/2017   Vaginal cyst 09/17/2017    Past Medical History:  Diagnosis Date   Abdominal pain    ADHD    Allergic rhinitis    Benign neoplasm of colon    Chronic fatigue    Constipation    COVID 10/2020   GERD (gastroesophageal reflux disease)    Headache(784.0)    otc meds prn   Hearing loss    Hiatal hernia    Hyperthyroidism    no meds   IBS (irritable bowel syndrome)    Irregular heart beat    history - no treatment  Dr Wynonia Lawman   Low vitamin D level    Polyp of stomach and duodenum    PONV (postoperative nausea and vomiting)    Rectal pain    Right ovarian cyst 2016   CT scan - 1 cm, possible dermoid.   Sinus tachycardia    SVT (supraventricular tachycardia)    Thrombocytosis    Urinary incontinence    urgency and night urination   Vaginal cyst 2019   posterior vaginal wall - 8 mm   Vaginal cyst     Past Surgical History:  Procedure Laterality Date   ANTERIOR AND POSTERIOR REPAIR     with biological grafts   BREAST SURGERY  04/07/2016   Lt.Br.Bx--Fibroadenoma   DIAGNOSTIC LAPAROSCOPY      DILATION AND CURETTAGE OF UTERUS     ENDOMETRIAL ABLATION  05/2011   INCONTINENCE SURGERY     [facility] midurethral sling and cystoscopy   LAPAROSCOPY  06/12/2011   Procedure: LAPAROSCOPY OPERATIVE;  Surgeon: Arloa Koh;  Location: [facility];  Service: Gynecology;  Laterality: N/A;   Left Ovarian Cystectomy   NOSE SURGERY     SHOULDER SURGERY     bilateral left x 2, right x 1 surgery   svd      x 1   UPPER GASTROINTESTINAL ENDOSCOPY     WISDOM TOOTH EXTRACTION      Current Outpatient Medications  Medication Sig Dispense Refill   cetirizine (ZYRTEC) 10 MG tablet Take 10 mg by mouth daily.     COVID-19 At Home Antigen Test [facility] COVID-19 HOME TEST) KIT Use as directed 4 each 0   Cyanocobalamin (VITAMIN  B12 PO) Take 1 each by mouth as needed. gummie      cyclobenzaprine (FLEXERIL) 10 MG tablet Take 10 mg by mouth as needed.  0   diclofenac sodium (VOLTAREN) 1 % GEL as needed.  0   diphenhydrAMINE (BENADRYL) 50 MG capsule Take one capsule 1 hour prior to scan. 1 capsule 0   fluticasone (FLONASE) 50 MCG/ACT nasal spray Place 2 sprays into both nostrils daily.  3   fluticasone (FLONASE) 50 MCG/ACT nasal spray Place 2 sprays into both nostrils daily. 48 g 1   lisdexamfetamine (VYVANSE) 30 MG capsule Take 1 capsule (30 mg total) by mouth every morning. 30 capsule 0   lisdexamfetamine (VYVANSE) 30 MG capsule Take 1 capsule (30 mg total) by mouth every morning. 30 capsule 0   lisdexamfetamine (VYVANSE) 30 MG capsule Take 1 capsule (30 mg total) by mouth every morning. 30 capsule 0   lisdexamfetamine (VYVANSE) 30 MG capsule Take 1 capsule (30 mg total) by mouth every morning. 30 capsule 0   Multiple Vitamins-Minerals (MULTIVITAMIN WITH MINERALS) tablet Take 1 tablet by mouth daily.       omeprazole (PRILOSEC) 20 MG capsule Take 1 capsule by mouth daily.     omeprazole (PRILOSEC) 20 MG capsule Take 1 capsule (20 mg total) by mouth daily. 90 capsule 3   omeprazole-sodium bicarbonate (ZEGERID)  40-1100 MG per capsule Take 1 capsule by mouth daily before breakfast.       oxyCODONE-acetaminophen (PERCOCET/ROXICET) 5-325 MG tablet Take 1 tablet by mouth every 4 (four) hours. Prn pain  0   predniSONE (DELTASONE) 50 MG tablet Take one tablet 13 hours, 7 hours, and 1 hour prior to scan. 3 tablet 0   Magnesium 100 MG CAPS magnesium (Patient not taking: Reported on 08/04/2022)     meloxicam (MOBIC) 15 MG tablet Take 1 tablet (15 mg total) by mouth daily. (Patient not taking: Reported on 08/04/2022) 30 tablet 1   montelukast (SINGULAIR) 10 MG tablet Take 1 tablet (10 mg total) by mouth daily. (Patient not taking: Reported on 08/04/2022) 30 tablet 0   No current facility-administered medications for this visit.     ALLERGIES: Gadolinium derivatives, Augmentin [amoxicillin-pot clavulanate], Buprenorphine hcl, Codeine, Morphine and related, and Vicodin [hydrocodone-acetaminophen]  Family History  Problem Relation Age of Onset   Fibromyalgia Mother    Thyroid disease Mother    Seizures Father        d/t head injury   Cancer Father        ?stomach ca   Cancer Paternal Grandmother        colon   Hypertension Paternal Grandmother    Stroke Paternal Grandmother    Diabetes Paternal Aunt    Hypertension Maternal Grandmother    Thyroid disease Maternal Grandmother     Social History   Socioeconomic History   Marital status: Single    Spouse name: Not on file   Number of children: 1   Years of education: Not on file   Highest education level: Not on file  Occupational History   Occupation: Nurse with [employer]  Tobacco Use   Smoking status: Never   Smokeless tobacco: Never  Vaping Use   Vaping Use: Never used  Substance and Sexual Activity   Alcohol use: Yes    Alcohol/week: 0.0 standard drinks of alcohol    Comment: only occasionally--once a month   Drug use: No   Sexual activity: Yes    Partners: Female    Comment: ablation-Novasure  Other  Topics Concern   Not on file  Social  History Narrative   Not on file   Social Determinants of Health   Financial Resource Strain: Not on file  Food Insecurity: Not on file  Transportation Needs: Not on file  Physical Activity: Not on file  Stress: Not on file  Social Connections: Not on file  Intimate Partner Violence: Not on file    Review of Systems  All other systems reviewed and are negative.   PHYSICAL EXAMINATION:    BP 118/74   Ht '5\' 3"'$  (1.6 m)   Wt 126 lb (57.2 kg)   BMI 22.32 kg/m     General appearance: alert, cooperative and appears stated age  ASSESSMENT  Menopausal symptoms.   PLAN  We discussed treatment options:  hormone therapy, Paxil or Effexor, Neurontin.  She will start HRT.  Risks and benefits reviewed.  Discused WHI and use of HRT which can increase risk of PE, DVT, MI, stroke and breast cancer.  Vivelle Dot 0.05 mg to skin twice weekly.  #24, RF none. Prometrium 100 mg q hs #90 RF none.  Brochure to patient on menopause.  Follow up for annual exam and prn.    An After Visit Summary was printed and given to the patient.  23 min  total time was spent for this patient encounter, including preparation, face-to-face counseling with the patient, coordination of care, and documentation of the encounter.

## 2022-08-06 ENCOUNTER — Other Ambulatory Visit (HOSPITAL_COMMUNITY): Payer: Self-pay

## 2022-08-14 NOTE — Progress Notes (Signed)
52 y.o. [id number] Single Caucasian female here for annual exam.    Started HRT about 2 weeks ago.  Sleeping better.  Hot flashes are improved.   Has rectocele and needs to empty more than once to complete a BM.  Has chronic rectal spasms at night.  Up to date on colonoscopy, last done in 2021.  No urinary incontinence but has frequency.  Up 1 - 2 times a night.  Had urinary retention in past with medication use.   PCP:   Carol Ada, MD  No LMP recorded. Patient has had an ablation.           Sexually active: Yes.    The current method of family planning is ablation.    Exercising: Yes.    Walking, weights Smoker:  no  Health Maintenance: Pap:  09/15/17 neg: HR HPV neg, 04-24-15 Neg:Neg HR HPV, 07-31-13 Neg:Neg HR HPV  History of abnormal Pap:  no MMG:  07/29/22 Breast Density Category C, BI-RADS CATEGORY 2 Benign Colonoscopy:  06/05/20 per patient normal, repeat 5 years [facility] BMD:   07/16/22  Result  osteopenia, low fracture risk TDaP:  09/15/17 Gardasil:   no HIV: 09/15/17 NR Hep C: 09/15/17 Neg Screening Labs:  02/2022 - PCP Fluv vaccine:  completed.    reports that she has never smoked. She has been exposed to tobacco smoke. She has never used smokeless tobacco. She reports current alcohol use. She reports that she does not use drugs.  Past Medical History:  Diagnosis Date   Abdominal pain    ADHD    Allergic rhinitis    Benign neoplasm of colon    Chronic fatigue    Constipation    COVID 10/2020   GERD (gastroesophageal reflux disease)    Headache(784.0)    otc meds prn   Hearing loss    Hiatal hernia    Hyperthyroidism    no meds   IBS (irritable bowel syndrome)    Irregular heart beat    history - no treatment  Dr Wynonia Lawman   Low vitamin D level    Polyp of stomach and duodenum    PONV (postoperative nausea and vomiting)    Rectal pain    Right ovarian cyst 2016   CT scan - 1 cm, possible dermoid.   Sinus tachycardia    SVT (supraventricular tachycardia)     Thrombocytosis    Urinary incontinence    urgency and night urination   Vaginal cyst 2019   posterior vaginal wall - 8 mm   Vaginal cyst     Past Surgical History:  Procedure Laterality Date   ANTERIOR AND POSTERIOR REPAIR     with biological grafts   BREAST SURGERY  04/07/2016   Lt.Br.Bx--Fibroadenoma   DIAGNOSTIC LAPAROSCOPY     DILATION AND CURETTAGE OF UTERUS     ENDOMETRIAL ABLATION  05/2011   INCONTINENCE SURGERY     [facility] midurethral sling and cystoscopy   LAPAROSCOPY  06/12/2011   Procedure: LAPAROSCOPY OPERATIVE;  Surgeon: Arloa Koh;  Location: [facility];  Service: Gynecology;  Laterality: N/A;   Left Ovarian Cystectomy   NOSE SURGERY     SHOULDER SURGERY     bilateral left x 2, right x 1 surgery   svd      x 1   UPPER GASTROINTESTINAL ENDOSCOPY     WISDOM TOOTH EXTRACTION      Current Outpatient Medications  Medication Sig Dispense Refill   cetirizine (ZYRTEC) 10 MG tablet Take  10 mg by mouth daily.     COVID-19 At Home Antigen Test [facility] COVID-19 HOME TEST) KIT Use as directed 4 each 0   Cyanocobalamin (VITAMIN B12 PO) Take 1 each by mouth as needed. gummie      cyclobenzaprine (FLEXERIL) 10 MG tablet Take 10 mg by mouth as needed.  0   diclofenac sodium (VOLTAREN) 1 % GEL as needed.  0   diphenhydrAMINE (BENADRYL) 50 MG capsule Take one capsule 1 hour prior to scan. 1 capsule 0   estradiol (VIVELLE-DOT) 0.05 MG/24HR patch Place 1 patch (0.05 mg total) onto the skin 2 (two) times a week. 24 patch 0   fluticasone (FLONASE) 50 MCG/ACT nasal spray Place 2 sprays into both nostrils daily.  3   fluticasone (FLONASE) 50 MCG/ACT nasal spray Place 2 sprays into both nostrils daily. 48 g 1   lisdexamfetamine (VYVANSE) 30 MG capsule Take 1 capsule (30 mg total) by mouth every morning. 30 capsule 0   lisdexamfetamine (VYVANSE) 30 MG capsule Take 1 capsule (30 mg total) by mouth every morning. 30 capsule 0   lisdexamfetamine (VYVANSE) 30 MG capsule Take 1 capsule  (30 mg total) by mouth every morning. 30 capsule 0   lisdexamfetamine (VYVANSE) 30 MG capsule Take 1 capsule (30 mg total) by mouth every morning. 30 capsule 0   Multiple Vitamins-Minerals (MULTIVITAMIN WITH MINERALS) tablet Take 1 tablet by mouth daily.       omeprazole (PRILOSEC) 20 MG capsule Take 1 capsule by mouth daily.     omeprazole (PRILOSEC) 20 MG capsule Take 1 capsule (20 mg total) by mouth daily. 90 capsule 3   omeprazole-sodium bicarbonate (ZEGERID) 40-1100 MG per capsule Take 1 capsule by mouth daily before breakfast.       oxyCODONE-acetaminophen (PERCOCET/ROXICET) 5-325 MG tablet Take 1 tablet by mouth every 4 (four) hours. Prn pain  0   predniSONE (DELTASONE) 50 MG tablet Take one tablet 13 hours, 7 hours, and 1 hour prior to scan. 3 tablet 0   progesterone (PROMETRIUM) 100 MG capsule Take 1 capsule (100 mg total) by mouth daily. Take at bedtime. 90 capsule 0   Vibegron (GEMTESA) 75 MG TABS Take 1 tablet (75 mg total) by mouth daily. 30 tablet 1   Magnesium 100 MG CAPS magnesium (Patient not taking: Reported on 08/04/2022)     meloxicam (MOBIC) 15 MG tablet Take 1 tablet (15 mg total) by mouth daily. (Patient not taking: Reported on 08/04/2022) 30 tablet 1   montelukast (SINGULAIR) 10 MG tablet Take 1 tablet (10 mg total) by mouth daily. (Patient not taking: Reported on 08/04/2022) 30 tablet 0   No current facility-administered medications for this visit.    Family History  Problem Relation Age of Onset   Fibromyalgia Mother    Thyroid disease Mother    Seizures Father        d/t head injury   Cancer Father        ?stomach ca   Cancer Paternal Grandmother        colon   Hypertension Paternal Grandmother    Stroke Paternal Grandmother    Diabetes Paternal Aunt    Hypertension Maternal Grandmother    Thyroid disease Maternal Grandmother     Review of Systems  All other systems reviewed and are negative.   Exam:   BP 114/76 (BP Location: Right Arm, Patient Position:  Sitting, Cuff Size: Normal)   Ht 5' 2.5" (1.588 m)   Wt 130 lb (59 kg)  BMI 23.40 kg/m     General appearance: alert, cooperative and appears stated age Head: normocephalic, without obvious abnormality, atraumatic Neck: no adenopathy, supple, symmetrical, trachea midline and thyroid normal to inspection and palpation Lungs: clear to auscultation bilaterally Breasts: normal appearance, no masses or tenderness, No nipple retraction or dimpling, No nipple discharge or bleeding, No axillary adenopathy Heart: regular rate and rhythm Abdomen: soft, non-tender; no masses, no organomegaly Extremities: extremities normal, atraumatic, no cyanosis or edema Skin: skin color, texture, turgor normal. No rashes or lesions Lymph nodes: cervical, supraclavicular, and axillary nodes normal. Neurologic: grossly normal  Pelvic: External genitalia:  no lesions              No abnormal inguinal nodes palpated.              Urethra:  normal appearing urethra with no masses, tenderness or lesions              Bartholins and Skenes: normal                 Vagina: normal appearing vagina with normal color and discharge, no lesions.  First degree cystocele and first degree rectocele.               Cervix: no lesions.  2 small cervical polyps, 3 - 4 mm noted and removed with dressing forceps after receiving verbal permission.  Tissue to pathology in same specimen bottle.  No complications.  Minimal EBL.              Pap taken: yes Bimanual Exam:  Uterus:  normal size, contour, position, consistency, mobility, non-tender              Adnexa: no mass, fullness, tenderness              Rectal exam: yes.  Confirms.  8 mm rectovaginal cystic area midrectum (no change).              Anus:  normal sphincter tone, no lesions  Chaperone was present for exam:  yes.  Assessment:   Well woman visit with gynecologic exam. Status post anterior and posterior repair with biological grafts and midurethral sling Rectovaginal  mass.  Feels like a cyst and is not increasing in size. May be related to prior prolapse surgical repair or just a vaginal cyst.   Rectal spasms.  May be constipation related.  FH of colon and GI cancer. Status post endometrial ablation.  Cervical polyp. Menopausal symptoms. Improved on HRT. Hx overactive bladder. Off medication due to urinary retention. Hx left breast fibroadenoma.  Hx hyperthyroidism.  No medication.  Osteopenia.   Plan: Mammogram screening discussed. Self breast awareness reviewed. Pap and HR HPV collected. FU polyp biopsy results. Guidelines for Calcium, Vitamin D, regular exercise program including cardiovascular and weight bearing exercise. BMD in 2025. Continue HRT.  Trial of Gemtesa. #30, RF one.  Side effects reviewed.  FU in 6 weeks for a recheck of Gemtesa and HRT.  Also return for annual exam.   After visit summary provided.

## 2022-08-27 ENCOUNTER — Other Ambulatory Visit (HOSPITAL_COMMUNITY)
Admission: RE | Admit: 2022-08-27 | Discharge: 2022-08-27 | Disposition: A | Discharge status: Home / Self Care | Payer: 59 | Source: Ambulatory Visit | Attending: Obstetrics and Gynecology | Admitting: Obstetrics and Gynecology

## 2022-08-27 ENCOUNTER — Encounter: Payer: Self-pay | Admitting: Obstetrics and Gynecology

## 2022-08-27 ENCOUNTER — Other Ambulatory Visit (HOSPITAL_COMMUNITY): Payer: Self-pay

## 2022-08-27 ENCOUNTER — Ambulatory Visit (INDEPENDENT_AMBULATORY_CARE_PROVIDER_SITE_OTHER): Payer: 59 | Admitting: Obstetrics and Gynecology

## 2022-08-27 VITALS — BP 114/76 | Ht 62.5 in | Wt 130.0 lb

## 2022-08-27 DIAGNOSIS — Z124 Encounter for screening for malignant neoplasm of cervix: Secondary | ICD-10-CM

## 2022-08-27 DIAGNOSIS — N841 Polyp of cervix uteri: Secondary | ICD-10-CM | POA: Insufficient documentation

## 2022-08-27 DIAGNOSIS — R350 Frequency of micturition: Secondary | ICD-10-CM | POA: Diagnosis not present

## 2022-08-27 DIAGNOSIS — Z01419 Encounter for gynecological examination (general) (routine) without abnormal findings: Secondary | ICD-10-CM | POA: Diagnosis not present

## 2022-08-27 MED ORDER — GEMTESA 75 MG PO TABS
75.0000 mg | ORAL_TABLET | Freq: Every day | ORAL | 1 refills | Status: DC
  Filled 2022-08-27: qty 30, 30d supply, fill #0

## 2022-08-27 NOTE — Patient Instructions (Addendum)
Red Yeast Rice Capsules What is this medication? RED YEAST RICE (red yeest rahys) may support healthy cholesterol levels. The FDA has not evaluated this supplement for any medical use. It may contain ingredients not listed. Discuss all supplements you are taking with your care team. They can provide you with important safety information. This medicine may be used for other purposes; ask your health care provider or pharmacist if you have questions. COMMON BRAND NAME(S): Red Yeast Rice What should I tell my care team before I take this medication? They need to know if you have any of these conditions: Frequently drink alcohol Kidney disease Liver disease Muscle aches or weakness Other medical condition An unusual or allergic reaction to red yeast rice, went yeast, lovastatin, other 'statin' medications, other supplements, foods, dyes, or preservatives Pregnant or trying to get pregnant Breast-feeding How should I use this medication? Take this supplement by mouth with a glass of water. Follow the directions on the package labeling, or take as directed by your care team. Do not take this supplement more often than directed. Contact your care team about the use of this supplement in children. Special care may be needed. This supplement is not recommended for use in children. Overdosage: If you think you have taken too much of this medicine contact a poison control center or emergency room at once. NOTE: This medicine is only for you. Do not share this medicine with others. What if I miss a dose? If you miss a dose, take it as soon as you can. If it is almost time for your next dose, take only that dose. Do not take double or extra doses. What may interact with this medication? Do not take this medication with any of the following: Clarithromycin Delavirdine Erythromycin Grapefruit juice Protease inhibitors used to treat HIV infection Medications for fungal infections, such as itraconazole,  ketoconazole, posaconazole, and voriconazole Mibefradil Nefazodone Other medications for high cholesterol Telithromycin Troleandomycin This medication may also interact with the following: Alcohol Amiodarone Colchicine Cyclosporine Danazol Diltiazem Fenofibrate Fluconazole Gemfibrozil Mifepristone, RU-486 Niacin St. John's Wort Verapamil Voriconazole Warfarin This list may not describe all possible interactions. Give your health care provider a list of all the medicines, herbs, non-prescription drugs, or dietary supplements you use. Also tell them if you smoke, drink alcohol, or use illegal drugs. Some items may interact with your medicine. What should I watch for while using this medication? Visit your care team for regular check-ups. You may need regular tests to make sure your liver is working properly. Tell you care team right away if you get any unexplained muscle pain, tenderness, or weakness, especially if you also have a fever and tiredness. Some medications may increase the risk of side effects from this supplement. If you are given certain antibiotics or antifungals, you should stop taking this supplement during those treatments. Check with your care team or pharmacist for advice. If you are scheduled for any medical or dental procedure, tell your care team that you are taking this supplement. You may need to stop taking this supplement before the procedure. Do not use this supplement if you are pregnant or breast-feeding. Serious side effects to an unborn child or to an infant are possible. Talk to your care team or pharmacist for more information. Herbal or dietary supplements are not regulated like medications. Rigid quality control standards are not required for dietary supplements. The purity and strength of these products can vary. The safety and effect of this dietary supplement for a  certain disease or illness is not well known. This product is not intended to diagnose,  treat, cure or prevent any disease. The Food and Drug Administration suggests the following to help consumers protect themselves: Always read product labels and follow directions. Natural does not mean a product is safe for humans to take. Look for products that include USP after the ingredient name. This means that the manufacturer followed the standards of the [place] Pharmacopoeia. Supplements made or sold by a nationally known food or drug company are more likely to be made under tight controls. You can write to the company for more information about how the product was made. What side effects may I notice from receiving this medication? Side effects that you should report to your care team as soon as possible: Allergic reactions--skin rash, itching, hives, swelling of the face, lips, tongue, or throat Liver injury--right upper belly pain, loss of appetite, nausea, light-colored stool, dark yellow or brown urine, yellowing skin or eyes, unusual weakness or fatigue Muscle injury--unusual weakness or fatigue, muscle pain, dark yellow or brown urine, decrease in amount of urine Side effects that usually do not require medical attention (report to your care team if they continue or are bothersome): Dizziness Gas Headache Heartburn This list may not describe all possible side effects. Call your doctor for medical advice about side effects. You may report side effects to FDA at 1-800-FDA-1088. Where should I keep my medication? Keep out of the reach of children and pets. Store at room temperature between 15 and 30 degrees C (59 and 86 degrees F). Get rid of any unused supplement after the expiration date. NOTE: This sheet is a summary. It may not cover all possible information. If you have questions about this medicine, talk to your doctor, pharmacist, or health care provider.  2023 Elsevier/Gold Standard (2005-06-02 00:00:00)   Vibegron Tablets What is this medication? VIBEGRON (vye BEG ron)  treats symptoms of an overactive bladder, such as loss of bladder control or frequent need to urinate. It works by relaxing muscles in the bladder. It belongs to a group of medications called antispasmodics. This medicine may be used for other purposes; ask your health care provider or pharmacist if you have questions. COMMON BRAND NAME(S): GEMTESA What should I tell my care team before I take this medication? They need to know if you have any of these conditions: Kidney disease Liver disease Prostate disease Trouble passing urine An unusual or allergic reaction to vibegron, other medications, foods, dyes, or preservatives Pregnant or trying to get pregnant Breast-feeding How should I use this medication? Take this medication by mouth with water. Take it as directed on the prescription label at the same time every day. Swallow the tablets whole. You may crush the tablet and mix with 1 tablespoon (15 mL) of applesauce. Swallow the medication and applesauce right away. Keep taking it unless your care team tells you to stop. You can take it with or without food. If it upsets your stomach, take it with food. Talk to your care team about the use of this medication in children. Special care may be needed. Overdosage: If you think you have taken too much of this medicine contact a poison control center or emergency room at once. NOTE: This medicine is only for you. Do not share this medicine with others. What if I miss a dose? If you miss a dose, take it as soon as you can. If it is almost time for your next dose, take  only that dose. Do not take double or extra doses. What may interact with this medication? This medication may interact with the following: Digoxin This list may not describe all possible interactions. Give your health care provider a list of all the medicines, herbs, non-prescription drugs, or dietary supplements you use. Also tell them if you smoke, drink alcohol, or use illegal  drugs. Some items may interact with your medicine. What should I watch for while using this medication? Visit your care team for regular checks on your progress. Tell your care team if your symptoms do not start to get better or if they get worse. You may need to limit your intake of tea, coffee, caffeinated drinks, or alcohol. These drinks may make your symptoms worse. What side effects may I notice from receiving this medication? Side effects that you should report to your care team as soon as possible: Allergic reactions--skin rash, itching, hives, swelling of the face, lips, tongue, or throat Trouble passing urine Side effects that usually do not require medical attention (report to your care team if they continue or are bothersome): Diarrhea Headache Nausea Runny or stuffy nose Sore throat This list may not describe all possible side effects. Call your doctor for medical advice about side effects. You may report side effects to FDA at 1-800-FDA-1088. Where should I keep my medication? Keep out of the reach of children and pets. Store at room temperature between 20 and 25 degrees C (68 and 77 degrees F). Get rid of any unused medication after the expiration date. To get rid of medications that are no longer needed or have expired: Take the medication to a medication take-back program. Check with your pharmacy or law enforcement to find a location. If you cannot return the medication, check the label or package insert to see if the medication should be thrown out in the garbage or flushed down the toilet. If you are not sure, ask your care team. If it is safe to put it in the trash, empty the medication out of the container. Mix the medication with cat litter, dirt, coffee grounds, or other unwanted substance. Seal the mixture in a bag or container. Put it in the trash. NOTE: This sheet is a summary. It may not cover all possible information. If you have questions about this medicine, talk to  your doctor, pharmacist, or health care provider.  2023 Elsevier/Gold Standard (2021-08-04 00:00:00)

## 2022-08-31 LAB — SURGICAL PATHOLOGY

## 2022-09-02 LAB — CYTOLOGY - PAP
Comment: NEGATIVE
Diagnosis: NEGATIVE
High risk HPV: NEGATIVE

## 2022-09-03 ENCOUNTER — Telehealth: Payer: Self-pay | Admitting: Obstetrics and Gynecology

## 2022-09-03 NOTE — Telephone Encounter (Signed)
Left detailed VM per DPR. Advised pt to CB to let us know her thoughts.

## 2022-09-03 NOTE — Telephone Encounter (Signed)
Prior Authorization Determination received regarding [name] was denied.   Patient needs to try Myrbetriq and oxybutynin first before Logan Bores will be considered.   She had urinary retention with Myrbetriq in 2017.   I recommend a course of Ditropan XL 10 mg daily if patient would like to consider this.  It may cause dry mouth, constipation, and possible urinary retention.

## 2022-09-07 ENCOUNTER — Other Ambulatory Visit (HOSPITAL_COMMUNITY): Payer: Self-pay

## 2022-09-07 MED ORDER — LISDEXAMFETAMINE DIMESYLATE 30 MG PO CAPS
30.0000 mg | ORAL_CAPSULE | ORAL | 0 refills | Status: DC
  Filled 2022-09-07: qty 30, 30d supply, fill #0

## 2022-09-07 MED ORDER — LISDEXAMFETAMINE DIMESYLATE 30 MG PO CAPS
30.0000 mg | ORAL_CAPSULE | Freq: Every day | ORAL | 0 refills | Status: AC
  Filled 2022-09-07: qty 30, 30d supply, fill #0

## 2022-09-08 ENCOUNTER — Other Ambulatory Visit (HOSPITAL_COMMUNITY): Payer: Self-pay

## 2022-09-09 ENCOUNTER — Other Ambulatory Visit (HOSPITAL_COMMUNITY): Payer: Self-pay

## 2022-09-10 ENCOUNTER — Other Ambulatory Visit (HOSPITAL_COMMUNITY): Payer: Self-pay

## 2022-09-10 DIAGNOSIS — K219 Gastro-esophageal reflux disease without esophagitis: Secondary | ICD-10-CM | POA: Diagnosis not present

## 2022-09-10 DIAGNOSIS — F902 Attention-deficit hyperactivity disorder, combined type: Secondary | ICD-10-CM | POA: Diagnosis not present

## 2022-09-10 DIAGNOSIS — R21 Rash and other nonspecific skin eruption: Secondary | ICD-10-CM | POA: Diagnosis not present

## 2022-09-10 DIAGNOSIS — Z79899 Other long term (current) drug therapy: Secondary | ICD-10-CM | POA: Diagnosis not present

## 2022-09-10 MED ORDER — TRIAMCINOLONE ACETONIDE 0.5 % EX CREA
TOPICAL_CREAM | Freq: Two times a day (BID) | CUTANEOUS | 1 refills | Status: AC
  Filled 2022-09-10: qty 30, 10d supply, fill #0
  Filled 2022-11-16: qty 30, 10d supply, fill #1

## 2022-09-10 MED ORDER — VYVANSE 30 MG PO CAPS
30.0000 mg | ORAL_CAPSULE | Freq: Every morning | ORAL | 0 refills | Status: DC
  Filled 2022-09-10 – 2022-10-28 (×2): qty 30, 30d supply, fill #0

## 2022-09-11 ENCOUNTER — Other Ambulatory Visit (HOSPITAL_COMMUNITY): Payer: Self-pay

## 2022-09-17 NOTE — Telephone Encounter (Signed)
LVMTCB

## 2022-09-24 NOTE — Progress Notes (Signed)
GYNECOLOGY  VISIT   HPI: 52 y.o.   Single  Caucasian  female   [id number] with No LMP recorded. Patient has had an ablation.   here for   6 week follow up of HRT.  Pt wants to discuss gum bleeding and swelling since starting the estradiol and progesterone.  Saw her dentist, who identified gingivitis.  Got a Water Pick for oral care. Gingivitis seems to be better.  Logan Bores was not approved for treating her overactive bladder. She does not want to do any additional medication at this time. Used Mybetriq in past and had bladder retention.  She also used Detrol and had urinary retention.  Has not tried Ditropan.   Feels a bulge in the vagina.   She has done pelvic floor therapy in the past.    GYNECOLOGIC HISTORY: No LMP recorded. Patient has had an ablation. Contraception:  ablation Menopausal hormone therapy:  estradiol patch Last mammogram:   07/29/22 Breast Density Category C, BI-RADS CATEGORY 2 Benign  Last pap smear:   09/15/17 neg: HR HPV neg, 04-24-15 Neg:Neg HR HPV, 07-31-13 Neg:Neg HR HPV          OB History     Gravida  1   Para  1   Term  1   Preterm      AB      Living  1      SAB      IAB      Ectopic      Multiple      Live Births                 Patient Active Problem List   Diagnosis Date Noted   Bilateral hearing loss 08/17/2019   Cervical spondylosis 01/24/2019   Rectal pain 12/15/2017   Pain at injection site 10/01/2017   Vaginal cyst 09/17/2017   Chronic neck pain 08/16/2017   Acute recurrent maxillary sinusitis 09/13/2015   Allergic rhinitis 09/13/2015   Attention-deficit hyperactivity disorder, combined type 09/13/2015   Bilateral impacted cerumen 09/13/2015    Past Medical History:  Diagnosis Date   Abdominal pain    ADHD    Allergic rhinitis    Benign neoplasm of colon    Chronic fatigue    Constipation    COVID 10/2020   GERD (gastroesophageal reflux disease)    Headache(784.0)    otc meds prn   Hearing loss    Hiatal  hernia    Hyperthyroidism    no meds   IBS (irritable bowel syndrome)    Irregular heart beat    history - no treatment  Dr Wynonia Lawman   Low vitamin D level    Polyp of stomach and duodenum    PONV (postoperative nausea and vomiting)    Rectal pain    Right ovarian cyst 2016   CT scan - 1 cm, possible dermoid.   Sinus tachycardia    SVT (supraventricular tachycardia)    Thrombocytosis    Urinary incontinence    urgency and night urination   Vaginal cyst 2019   posterior vaginal wall - 8 mm   Vaginal cyst     Past Surgical History:  Procedure Laterality Date   ANTERIOR AND POSTERIOR REPAIR     with biological grafts   BREAST SURGERY  04/07/2016   Lt.Br.Bx--Fibroadenoma   DIAGNOSTIC LAPAROSCOPY     DILATION AND CURETTAGE OF UTERUS     ENDOMETRIAL ABLATION  05/2011   INCONTINENCE SURGERY  [facility] midurethral sling and cystoscopy   LAPAROSCOPY  06/12/2011   Procedure: LAPAROSCOPY OPERATIVE;  Surgeon: Arloa Koh;  Location: [facility];  Service: Gynecology;  Laterality: N/A;   Left Ovarian Cystectomy   NOSE SURGERY     SHOULDER SURGERY     bilateral left x 2, right x 1 surgery   svd      x 1   UPPER GASTROINTESTINAL ENDOSCOPY     WISDOM TOOTH EXTRACTION      Current Outpatient Medications  Medication Sig Dispense Refill   cetirizine (ZYRTEC) 10 MG tablet Take 10 mg by mouth daily.     COVID-19 At Home Antigen Test [facility] COVID-19 HOME TEST) KIT Use as directed 4 each 0   Cyanocobalamin (VITAMIN B12 PO) Take 1 each by mouth as needed. gummie      cyclobenzaprine (FLEXERIL) 10 MG tablet Take 10 mg by mouth as needed.  0   diclofenac sodium (VOLTAREN) 1 % GEL as needed.  0   diphenhydrAMINE (BENADRYL) 50 MG capsule Take one capsule 1 hour prior to scan. 1 capsule 0   estradiol (VIVELLE-DOT) 0.0375 MG/24HR Place 1 patch onto the skin 2 (two) times a week. 8 patch 10   fluticasone (FLONASE) 50 MCG/ACT nasal spray Place 2 sprays into both nostrils daily.  3   fluticasone  (FLONASE) 50 MCG/ACT nasal spray Place 2 sprays into both nostrils daily. 48 g 1   lisdexamfetamine (VYVANSE) 30 MG capsule Take 1 capsule by mouth...may fill 30 days after last refill every morning 30 capsule 0   Magnesium 100 MG CAPS      meloxicam (MOBIC) 15 MG tablet Take 1 tablet (15 mg total) by mouth daily. 30 tablet 1   montelukast (SINGULAIR) 10 MG tablet Take 1 tablet (10 mg total) by mouth daily. 30 tablet 0   Multiple Vitamins-Minerals (MULTIVITAMIN WITH MINERALS) tablet Take 1 tablet by mouth daily.       omeprazole (PRILOSEC) 20 MG capsule Take 1 capsule (20 mg total) by mouth daily. 90 capsule 3   omeprazole-sodium bicarbonate (ZEGERID) 40-1100 MG per capsule Take 1 capsule by mouth daily before breakfast.       oxyCODONE-acetaminophen (PERCOCET/ROXICET) 5-325 MG tablet Take 1 tablet by mouth every 4 (four) hours. Prn pain  0   triamcinolone cream (KENALOG) 0.5 % Apply 1 application topically 2 (two) times daily to affected area 30 g 1   Vibegron (GEMTESA) 75 MG TABS Take 1 tablet (75 mg total) by mouth daily. 30 tablet 1   VYVANSE 30 MG capsule Take 1 capsule (30 mg total) by mouth in the morning. 30 capsule 0   progesterone (PROMETRIUM) 100 MG capsule Take 1 capsule (100 mg total) by mouth daily. Take at bedtime. 90 capsule 3   No current facility-administered medications for this visit.     ALLERGIES: Gadolinium derivatives, Augmentin [amoxicillin-pot clavulanate], Buprenorphine hcl, Codeine, Morphine and related, and Vicodin [hydrocodone-acetaminophen]  Family History  Problem Relation Age of Onset   Fibromyalgia Mother    Thyroid disease Mother    Seizures Father        d/t head injury   Cancer Father        ?stomach ca   Cancer Paternal Grandmother        colon   Hypertension Paternal Grandmother    Stroke Paternal Grandmother    Diabetes Paternal Aunt    Hypertension Maternal Grandmother    Thyroid disease Maternal Grandmother     Social History  Socioeconomic History   Marital status: Single    Spouse name: Not on file   Number of children: 1   Years of education: Not on file   Highest education level: Not on file  Occupational History   Occupation: Nurse with [employer]  Tobacco Use   Smoking status: Never    Passive exposure: Past   Smokeless tobacco: Never  Vaping Use   Vaping Use: Never used  Substance and Sexual Activity   Alcohol use: Yes    Alcohol/week: 0.0 standard drinks of alcohol    Comment: only occasionally--once a month   Drug use: No   Sexual activity: Yes    Partners: Female    Comment: ablation-Novasure, menarche 52yo  Other Topics Concern   Not on file  Social History Narrative   Not on file   Social Determinants of Health   Financial Resource Strain: Not on file  Food Insecurity: Not on file  Transportation Needs: Not on file  Physical Activity: Not on file  Stress: Not on file  Social Connections: Not on file  Intimate Partner Violence: Not on file    Review of Systems  All other systems reviewed and are negative.   PHYSICAL EXAMINATION:    BP 108/72 (BP Location: Left Arm, Patient Position: Sitting, Cuff Size: Normal)   Pulse 94   Ht 5' 2.5" (1.588 m)   Wt 132 lb (59.9 kg)   SpO2 99%   BMI 23.76 kg/m     General appearance: alert, cooperative and appears stated age  ASSESSMENT  HRT.  Gingivitis.  Overactive bladder.    PLAN  We reviewed HRT and potential side effects.   Gingivitis is not expected.  Will reduce Vivelle Dot to 0.0375 mg twice weekly and continue Prometrium 100 mg q hs to see if hot flashes are controlled and gingivitis improves. Patient currently declines to try additional medication for overactive bladder.  We reviewed heavy lifting as a risk factor for recurrence of prolapse.  Fu for annual exam and prn.    20 min  total time was spent for this patient encounter, including preparation, face-to-face counseling with the patient, coordination of care,  and documentation of the encounter.

## 2022-09-30 NOTE — Telephone Encounter (Signed)
Encounter reviewed and closed.  Will review at office visit.

## 2022-09-30 NOTE — Telephone Encounter (Signed)
2VMs left and no returned call.  Pt scheduled on 10/08/2022 for 6wk f/u. OK to close for now and address at [place]? Please advise.

## 2022-10-08 ENCOUNTER — Ambulatory Visit (INDEPENDENT_AMBULATORY_CARE_PROVIDER_SITE_OTHER): Payer: 59 | Admitting: Obstetrics and Gynecology

## 2022-10-08 ENCOUNTER — Encounter: Payer: Self-pay | Admitting: Obstetrics and Gynecology

## 2022-10-08 ENCOUNTER — Other Ambulatory Visit (HOSPITAL_COMMUNITY): Payer: Self-pay

## 2022-10-08 VITALS — BP 108/72 | HR 94 | Ht 62.5 in | Wt 132.0 lb

## 2022-10-08 DIAGNOSIS — Z79890 Hormone replacement therapy (postmenopausal): Secondary | ICD-10-CM

## 2022-10-08 MED ORDER — PROGESTERONE MICRONIZED 100 MG PO CAPS
100.0000 mg | ORAL_CAPSULE | Freq: Every day | ORAL | 3 refills | Status: DC
  Filled 2022-10-08 – 2022-10-28 (×2): qty 90, 90d supply, fill #0
  Filled 2023-01-27: qty 90, 90d supply, fill #1
  Filled 2023-04-27: qty 90, 90d supply, fill #2
  Filled 2023-08-04: qty 90, 90d supply, fill #3
  Filled 2023-08-05: qty 90, 90d supply, fill #0

## 2022-10-08 MED ORDER — ESTRADIOL 0.0375 MG/24HR TD PTTW
1.0000 | MEDICATED_PATCH | TRANSDERMAL | 10 refills | Status: DC
  Filled 2022-10-08 – 2022-10-12 (×2): qty 8, 28d supply, fill #0
  Filled 2022-11-16: qty 8, 28d supply, fill #1
  Filled 2022-12-15: qty 8, 28d supply, fill #2
  Filled 2023-01-05 – 2023-01-07 (×2): qty 8, 28d supply, fill #3
  Filled 2023-02-04: qty 8, 28d supply, fill #4
  Filled 2023-03-04: qty 8, 28d supply, fill #5
  Filled 2023-03-30: qty 8, 28d supply, fill #6
  Filled 2023-04-27: qty 8, 28d supply, fill #7
  Filled 2023-05-24: qty 8, 28d supply, fill #8
  Filled 2023-06-21: qty 8, 28d supply, fill #9
  Filled 2023-07-16: qty 8, 28d supply, fill #10

## 2022-10-09 ENCOUNTER — Other Ambulatory Visit (HOSPITAL_COMMUNITY): Payer: Self-pay

## 2022-10-12 ENCOUNTER — Other Ambulatory Visit (HOSPITAL_BASED_OUTPATIENT_CLINIC_OR_DEPARTMENT_OTHER): Payer: Self-pay

## 2022-10-12 ENCOUNTER — Other Ambulatory Visit (HOSPITAL_COMMUNITY): Payer: Self-pay

## 2022-10-28 ENCOUNTER — Other Ambulatory Visit (HOSPITAL_COMMUNITY): Payer: Self-pay

## 2022-10-30 ENCOUNTER — Other Ambulatory Visit (HOSPITAL_COMMUNITY): Payer: Self-pay

## 2022-10-30 DIAGNOSIS — H47011 Ischemic optic neuropathy, right eye: Secondary | ICD-10-CM | POA: Diagnosis not present

## 2022-10-30 DIAGNOSIS — H5711 Ocular pain, right eye: Secondary | ICD-10-CM | POA: Diagnosis not present

## 2022-10-30 DIAGNOSIS — H2513 Age-related nuclear cataract, bilateral: Secondary | ICD-10-CM | POA: Diagnosis not present

## 2022-10-30 DIAGNOSIS — H524 Presbyopia: Secondary | ICD-10-CM | POA: Diagnosis not present

## 2022-11-01 ENCOUNTER — Telehealth: Payer: 59 | Admitting: Family

## 2022-11-01 DIAGNOSIS — B9689 Other specified bacterial agents as the cause of diseases classified elsewhere: Secondary | ICD-10-CM

## 2022-11-01 DIAGNOSIS — J069 Acute upper respiratory infection, unspecified: Secondary | ICD-10-CM | POA: Diagnosis not present

## 2022-11-01 DIAGNOSIS — J019 Acute sinusitis, unspecified: Secondary | ICD-10-CM | POA: Diagnosis not present

## 2022-11-01 MED ORDER — BENZONATATE 100 MG PO CAPS: 100.0000 mg | ORAL_CAPSULE | Freq: Three times a day (TID) | ORAL | 0 refills | Status: DC | PRN

## 2022-11-01 MED ORDER — CETIRIZINE HCL 10 MG PO TABS: 10.0000 mg | ORAL_TABLET | Freq: Every day | ORAL | 1 refills | Status: AC

## 2022-11-01 MED ORDER — FLUTICASONE PROPIONATE 50 MCG/ACT NA SUSP: 2.0000 | Freq: Every day | NASAL | 6 refills | Status: AC

## 2022-11-01 NOTE — Progress Notes (Signed)

## 2022-11-04 MED ORDER — AZITHROMYCIN 250 MG PO TABS
ORAL_TABLET | ORAL | 0 refills | Status: AC
Start: 2022-11-04 — End: 2022-11-09

## 2022-11-04 MED ORDER — PREDNISONE 10 MG PO TABS
ORAL_TABLET | ORAL | 0 refills | Status: DC
Start: 2022-11-04 — End: 2023-03-30

## 2022-11-04 NOTE — Addendum Note (Signed)
Addended by: Margaretann Loveless on: 11/04/2022 10:41 AM   Modules accepted: Orders

## 2022-11-17 ENCOUNTER — Other Ambulatory Visit: Payer: Self-pay

## 2022-12-02 ENCOUNTER — Other Ambulatory Visit (HOSPITAL_COMMUNITY): Payer: Self-pay

## 2022-12-02 MED ORDER — VYVANSE 30 MG PO CAPS
30.0000 mg | ORAL_CAPSULE | Freq: Every morning | ORAL | 0 refills | Status: DC
  Filled 2022-12-02: qty 30, 30d supply, fill #0

## 2022-12-03 ENCOUNTER — Other Ambulatory Visit (HOSPITAL_COMMUNITY): Payer: Self-pay

## 2022-12-04 ENCOUNTER — Other Ambulatory Visit (HOSPITAL_COMMUNITY): Payer: Self-pay

## 2023-01-05 ENCOUNTER — Other Ambulatory Visit (HOSPITAL_COMMUNITY): Payer: Self-pay

## 2023-01-05 MED ORDER — VYVANSE 30 MG PO CAPS
30.0000 mg | ORAL_CAPSULE | Freq: Every morning | ORAL | 0 refills | Status: DC
  Filled 2023-01-05: qty 30, 30d supply, fill #0

## 2023-01-06 ENCOUNTER — Other Ambulatory Visit (HOSPITAL_COMMUNITY): Payer: Self-pay

## 2023-01-07 ENCOUNTER — Other Ambulatory Visit (HOSPITAL_COMMUNITY): Payer: Self-pay

## 2023-02-04 ENCOUNTER — Other Ambulatory Visit (HOSPITAL_COMMUNITY): Payer: Self-pay

## 2023-02-04 ENCOUNTER — Other Ambulatory Visit: Payer: Self-pay

## 2023-02-04 MED ORDER — VYVANSE 30 MG PO CAPS
30.0000 mg | ORAL_CAPSULE | Freq: Every morning | ORAL | 0 refills | Status: AC
  Filled 2023-02-04: qty 30, 30d supply, fill #0

## 2023-02-05 ENCOUNTER — Other Ambulatory Visit (HOSPITAL_COMMUNITY): Payer: Self-pay

## 2023-03-02 DIAGNOSIS — F902 Attention-deficit hyperactivity disorder, combined type: Secondary | ICD-10-CM | POA: Diagnosis not present

## 2023-03-02 DIAGNOSIS — E7800 Pure hypercholesterolemia, unspecified: Secondary | ICD-10-CM | POA: Diagnosis not present

## 2023-03-02 DIAGNOSIS — Z1159 Encounter for screening for other viral diseases: Secondary | ICD-10-CM | POA: Diagnosis not present

## 2023-03-02 DIAGNOSIS — Z0000 Encounter for general adult medical examination without abnormal findings: Secondary | ICD-10-CM | POA: Diagnosis not present

## 2023-03-02 DIAGNOSIS — J309 Allergic rhinitis, unspecified: Secondary | ICD-10-CM | POA: Diagnosis not present

## 2023-03-02 DIAGNOSIS — K219 Gastro-esophageal reflux disease without esophagitis: Secondary | ICD-10-CM | POA: Diagnosis not present

## 2023-03-05 ENCOUNTER — Other Ambulatory Visit (HOSPITAL_BASED_OUTPATIENT_CLINIC_OR_DEPARTMENT_OTHER): Payer: Self-pay

## 2023-03-06 ENCOUNTER — Other Ambulatory Visit (HOSPITAL_BASED_OUTPATIENT_CLINIC_OR_DEPARTMENT_OTHER): Payer: Self-pay

## 2023-03-06 MED ORDER — LISDEXAMFETAMINE DIMESYLATE 30 MG PO CAPS
30.0000 mg | ORAL_CAPSULE | Freq: Every day | ORAL | 0 refills | Status: DC
  Filled 2023-03-08: qty 30, 30d supply, fill #0

## 2023-03-08 ENCOUNTER — Other Ambulatory Visit (HOSPITAL_BASED_OUTPATIENT_CLINIC_OR_DEPARTMENT_OTHER): Payer: Self-pay

## 2023-03-30 ENCOUNTER — Other Ambulatory Visit: Payer: Self-pay

## 2023-03-30 ENCOUNTER — Other Ambulatory Visit (HOSPITAL_BASED_OUTPATIENT_CLINIC_OR_DEPARTMENT_OTHER): Payer: Self-pay

## 2023-03-30 ENCOUNTER — Telehealth: Payer: 59 | Admitting: Physician Assistant

## 2023-03-30 DIAGNOSIS — L237 Allergic contact dermatitis due to plants, except food: Secondary | ICD-10-CM | POA: Diagnosis not present

## 2023-03-30 MED ORDER — PREDNISONE 10 MG PO TABS
ORAL_TABLET | ORAL | 0 refills | Status: AC
Start: 2023-03-30 — End: 2023-04-13
  Filled 2023-03-30: qty 37, 14d supply, fill #0

## 2023-03-30 NOTE — Progress Notes (Signed)
I have spent 5 minutes in review of e-visit questionnaire, review and updating patient chart, medical decision making and response to patient.   William Cody Martin, PA-C    

## 2023-03-30 NOTE — Progress Notes (Signed)
E Visit for Rash  We are sorry that you are not feeling well. Here is how we plan to help!  Based on what you shared with me it looks like you have contact dermatitis.  Contact dermatitis is a skin rash caused by something that touches the skin and causes irritation or inflammation.  Your skin may be red, swollen, dry, cracked, and itch.  The rash should go away in a few days but can last a few weeks.  If you get a rash, it's important to figure out what caused it so the irritant can be avoided in the future. and I am prescribing a two week course of steroids (37 tablets of 10 mg prednisone).  Days 1-4 take 4 tablets (40 mg) daily  Days 5-8 take 3 tablets (30 mg) daily, Days 9-11 take 2 tablets (20 mg) daily, Days 12-14 take 1 tablet (10 mg) daily.       HOME CARE:  Take cool showers and avoid direct sunlight. Apply cool compress or wet dressings. Take a bath in an oatmeal bath.  Sprinkle content of one Aveeno packet under running faucet with comfortably warm water.  Bathe for 15-20 minutes, 1-2 times daily.  Pat dry with a towel. Do not rub the rash. Use hydrocortisone cream. Take an antihistamine like Benadryl for widespread rashes that itch.  The adult dose of Benadryl is 25-50 mg by mouth 4 times daily. Caution:  This type of medication may cause sleepiness.  Do not drink alcohol, drive, or operate dangerous machinery while taking antihistamines.  Do not take these medications if you have prostate enlargement.  Read package instructions thoroughly on all medications that you take.  GET HELP RIGHT AWAY IF:  Symptoms don't go away after treatment. Severe itching that persists. If you rash spreads or swells. If you rash begins to smell. If it blisters and opens or develops a yellow-brown crust. You develop a fever. You have a sore throat. You become short of breath.  MAKE SURE YOU:  Understand these instructions. Will watch your condition. Will get help right away if you are not  doing well or get worse.  Thank you for choosing an e-visit.  Your e-visit answers were reviewed by a board certified advanced clinical practitioner to complete your personal care plan. Depending upon the condition, your plan could have included both over the counter or prescription medications.  Please review your pharmacy choice. Make sure the pharmacy is open so you can pick up prescription now. If there is a problem, you may contact your provider through MyChart messaging and have the prescription routed to another pharmacy.  Your safety is important to us. If you have drug allergies check your prescription carefully.   For the next 24 hours you can use MyChart to ask questions about today's visit, request a non-urgent call back, or ask for a work or school excuse. You will get an email in the next two days asking about your experience. I hope that your e-visit has been valuable and will speed your recovery.  

## 2023-04-06 ENCOUNTER — Other Ambulatory Visit (HOSPITAL_COMMUNITY): Payer: Self-pay

## 2023-04-06 ENCOUNTER — Other Ambulatory Visit (HOSPITAL_BASED_OUTPATIENT_CLINIC_OR_DEPARTMENT_OTHER): Payer: Self-pay

## 2023-04-06 MED ORDER — VYVANSE 30 MG PO CAPS
30.0000 mg | ORAL_CAPSULE | Freq: Every day | ORAL | 0 refills | Status: AC
  Filled 2023-04-06 – 2023-04-19 (×2): qty 30, 30d supply, fill #0

## 2023-04-06 MED ORDER — VYVANSE 30 MG PO CAPS
30.0000 mg | ORAL_CAPSULE | Freq: Every morning | ORAL | 0 refills | Status: DC
  Filled 2023-04-16: qty 30, 30d supply, fill #0

## 2023-04-08 ENCOUNTER — Other Ambulatory Visit (HOSPITAL_BASED_OUTPATIENT_CLINIC_OR_DEPARTMENT_OTHER): Payer: Self-pay

## 2023-04-09 ENCOUNTER — Other Ambulatory Visit (HOSPITAL_BASED_OUTPATIENT_CLINIC_OR_DEPARTMENT_OTHER): Payer: Self-pay

## 2023-04-09 ENCOUNTER — Other Ambulatory Visit (HOSPITAL_COMMUNITY): Payer: Self-pay

## 2023-04-15 ENCOUNTER — Other Ambulatory Visit (HOSPITAL_COMMUNITY): Payer: Self-pay

## 2023-04-15 ENCOUNTER — Other Ambulatory Visit (HOSPITAL_BASED_OUTPATIENT_CLINIC_OR_DEPARTMENT_OTHER): Payer: Self-pay

## 2023-04-16 ENCOUNTER — Other Ambulatory Visit (HOSPITAL_COMMUNITY): Payer: Self-pay

## 2023-04-16 ENCOUNTER — Other Ambulatory Visit (HOSPITAL_BASED_OUTPATIENT_CLINIC_OR_DEPARTMENT_OTHER): Payer: Self-pay

## 2023-04-16 ENCOUNTER — Other Ambulatory Visit: Payer: Self-pay

## 2023-04-16 MED ORDER — LISDEXAMFETAMINE DIMESYLATE 30 MG PO CAPS
30.0000 mg | ORAL_CAPSULE | Freq: Every morning | ORAL | 0 refills | Status: AC
  Filled 2023-04-16: qty 30, 30d supply, fill #0

## 2023-04-19 ENCOUNTER — Other Ambulatory Visit (HOSPITAL_BASED_OUTPATIENT_CLINIC_OR_DEPARTMENT_OTHER): Payer: Self-pay

## 2023-04-26 ENCOUNTER — Other Ambulatory Visit (HOSPITAL_BASED_OUTPATIENT_CLINIC_OR_DEPARTMENT_OTHER): Payer: Self-pay

## 2023-05-13 ENCOUNTER — Other Ambulatory Visit (HOSPITAL_COMMUNITY): Payer: Self-pay

## 2023-05-14 ENCOUNTER — Other Ambulatory Visit (HOSPITAL_BASED_OUTPATIENT_CLINIC_OR_DEPARTMENT_OTHER): Payer: Self-pay

## 2023-05-14 MED ORDER — LISDEXAMFETAMINE DIMESYLATE 30 MG PO CAPS
30.0000 mg | ORAL_CAPSULE | Freq: Every morning | ORAL | 0 refills | Status: AC
  Filled 2023-05-14: qty 30, 30d supply, fill #0

## 2023-05-27 ENCOUNTER — Other Ambulatory Visit (HOSPITAL_BASED_OUTPATIENT_CLINIC_OR_DEPARTMENT_OTHER): Payer: Self-pay

## 2023-05-28 DIAGNOSIS — K4090 Unilateral inguinal hernia, without obstruction or gangrene, not specified as recurrent: Secondary | ICD-10-CM

## 2023-05-28 HISTORY — DX: Unilateral inguinal hernia, without obstruction or gangrene, not specified as recurrent: K40.90

## 2023-06-14 ENCOUNTER — Other Ambulatory Visit (HOSPITAL_BASED_OUTPATIENT_CLINIC_OR_DEPARTMENT_OTHER): Payer: Self-pay

## 2023-06-14 MED ORDER — VYVANSE 30 MG PO CAPS
30.0000 mg | ORAL_CAPSULE | Freq: Every morning | ORAL | 0 refills | Status: DC
  Filled 2023-06-14: qty 30, 30d supply, fill #0

## 2023-06-15 ENCOUNTER — Encounter: Payer: Self-pay | Admitting: Obstetrics and Gynecology

## 2023-06-15 ENCOUNTER — Other Ambulatory Visit (HOSPITAL_BASED_OUTPATIENT_CLINIC_OR_DEPARTMENT_OTHER): Payer: Self-pay

## 2023-06-15 ENCOUNTER — Other Ambulatory Visit: Payer: Self-pay

## 2023-06-15 ENCOUNTER — Ambulatory Visit: Payer: 59 | Admitting: Obstetrics and Gynecology

## 2023-06-15 ENCOUNTER — Telehealth: Payer: Self-pay | Admitting: Obstetrics and Gynecology

## 2023-06-15 VITALS — BP 126/80 | HR 91 | Ht 62.5 in | Wt 132.0 lb

## 2023-06-15 DIAGNOSIS — R1909 Other intra-abdominal and pelvic swelling, mass and lump: Secondary | ICD-10-CM

## 2023-06-15 DIAGNOSIS — R140 Abdominal distension (gaseous): Secondary | ICD-10-CM

## 2023-06-15 DIAGNOSIS — R599 Enlarged lymph nodes, unspecified: Secondary | ICD-10-CM

## 2023-06-15 NOTE — Telephone Encounter (Signed)
Please assist in scheduling CT of abdomen and pelvis with contrast for abdominal distention and left groin mass.

## 2023-06-15 NOTE — Progress Notes (Unsigned)
GYNECOLOGY  VISIT   HPI: 52 y.o.   Single  Caucasian female   G1P1001 with No LMP recorded. Patient has had an ablation.   here for: knot in groin. This has been there for a little bit but has caused more pain lately. Feels it is movable and almost like a cyst.  Soft lump present for 4 - 5 months.  Getting bigger and shape can be more elongated. No pain.   Does lifting at work.   Notes some right sided abdominal distention.   GYNECOLOGIC HISTORY: No LMP recorded. Patient has had an ablation. Contraception:  ablation Menopausal hormone therapy:  vivelle-dot and progesterone Last 2 paps:  09/15/17 neg: HR HPV neg, 04-24-15 Neg:Neg HR HPV, 07-31-13 Neg:Neg HR HPV  History of abnormal Pap or positive HPV:  no Mammogram:  07/29/22 Breast Density Category C, BI-RADS CATEGORY 2 Benign         OB History     Gravida  1   Para  1   Term  1   Preterm      AB      Living  1      SAB      IAB      Ectopic      Multiple      Live Births                 Patient Active Problem List   Diagnosis Date Noted   Bilateral hearing loss 08/17/2019   Cervical spondylosis 01/24/2019   Rectal pain 12/15/2017   Pain at injection site 10/01/2017   Vaginal cyst 09/17/2017   Chronic neck pain 08/16/2017   Acute recurrent maxillary sinusitis 09/13/2015   Allergic rhinitis 09/13/2015   Attention-deficit hyperactivity disorder, combined type 09/13/2015   Bilateral impacted cerumen 09/13/2015    Past Medical History:  Diagnosis Date   Abdominal pain    ADHD    Allergic rhinitis    Benign neoplasm of colon    Chronic fatigue    Constipation    COVID 10/2020   GERD (gastroesophageal reflux disease)    Headache(784.0)    otc meds prn   Hearing loss    Hiatal hernia    Hyperthyroidism    no meds   IBS (irritable bowel syndrome)    Irregular heart beat    history - no treatment  Dr Donnie Aho   Low vitamin D level    Polyp of stomach and duodenum    PONV (postoperative  nausea and vomiting)    Rectal pain    Right ovarian cyst 2016   CT scan - 1 cm, possible dermoid.   Sinus tachycardia    SVT (supraventricular tachycardia) (HCC)    Thrombocytosis    Urinary incontinence    urgency and night urination   Vaginal cyst 2019   posterior vaginal wall - 8 mm   Vaginal cyst     Past Surgical History:  Procedure Laterality Date   ANTERIOR AND POSTERIOR REPAIR     with biological grafts   BREAST SURGERY  04/07/2016   Lt.Br.Bx--Fibroadenoma   DIAGNOSTIC LAPAROSCOPY     DILATION AND CURETTAGE OF UTERUS     ENDOMETRIAL ABLATION  05/2011   INCONTINENCE SURGERY     [facility] midurethral sling and cystoscopy   LAPAROSCOPY  06/12/2011   Procedure: LAPAROSCOPY OPERATIVE;  Surgeon: Melony Overly;  Location: [facility];  Service: Gynecology;  Laterality: N/A;   Left Ovarian Cystectomy   NOSE SURGERY  SHOULDER SURGERY     bilateral left x 2, right x 1 surgery   svd      x 1   UPPER GASTROINTESTINAL ENDOSCOPY     WISDOM TOOTH EXTRACTION      Current Outpatient Medications  Medication Sig Dispense Refill   cetirizine (ZYRTEC ALLERGY) 10 MG tablet Take 1 tablet (10 mg total) by mouth daily. 90 tablet 1   Cyanocobalamin (VITAMIN B12 PO) Take 1 each by mouth as needed. gummie      cyclobenzaprine (FLEXERIL) 10 MG tablet Take 10 mg by mouth as needed.  0   diclofenac sodium (VOLTAREN) 1 % GEL as needed.  0   diphenhydrAMINE (BENADRYL) 50 MG capsule Take one capsule 1 hour prior to scan. 1 capsule 0   estradiol (VIVELLE-DOT) 0.0375 MG/24HR Place 1 patch onto the skin 2 (two) times a week. 8 patch 10   fluticasone (FLONASE) 50 MCG/ACT nasal spray Place 2 sprays into both nostrils daily. 16 g 6   lisdexamfetamine (VYVANSE) 30 MG capsule Take 1 capsule by mouth...may fill 30 days after last refill every morning 30 capsule 0   lisdexamfetamine (VYVANSE) 30 MG capsule Take 1 capsule (30 mg total) by mouth every morning. 30 capsule 0   lisdexamfetamine (VYVANSE) 30 MG  capsule 1 capsule in the morning Orally Once a day 30 days 30 capsule 0   meloxicam (MOBIC) 15 MG tablet Take 1 tablet (15 mg total) by mouth daily. 30 tablet 1   montelukast (SINGULAIR) 10 MG tablet Take 1 tablet (10 mg total) by mouth daily. 30 tablet 0   Multiple Vitamins-Minerals (MULTIVITAMIN WITH MINERALS) tablet Take 1 tablet by mouth daily.       omeprazole (PRILOSEC) 20 MG capsule Take 1 capsule (20 mg total) by mouth daily. 90 capsule 3   omeprazole-sodium bicarbonate (ZEGERID) 40-1100 MG per capsule Take 1 capsule by mouth daily before breakfast.       oxyCODONE-acetaminophen (PERCOCET/ROXICET) 5-325 MG tablet Take 1 tablet by mouth every 4 (four) hours. Prn pain  0   progesterone (PROMETRIUM) 100 MG capsule Take 1 capsule (100 mg total) by mouth daily. Take at bedtime. 90 capsule 3   triamcinolone cream (KENALOG) 0.5 % Apply 1 application topically 2 (two) times daily to affected area 30 g 1   VYVANSE 30 MG capsule Take 1 capsule (30 mg total) by mouth in the morning. 30 capsule 0   VYVANSE 30 MG capsule Take 1 capsule (30 mg total) by mouth daily. 30 capsule 0   VYVANSE 30 MG capsule Take 1 capsule (30 mg total) by mouth every morning. 30 capsule 0   No current facility-administered medications for this visit.     ALLERGIES: Gadolinium derivatives, Augmentin [amoxicillin-pot clavulanate], Buprenorphine hcl, Codeine, Morphine and codeine, and Vicodin [hydrocodone-acetaminophen]  Family History  Problem Relation Age of Onset   Fibromyalgia Mother    Thyroid disease Mother    Seizures Father        d/t head injury   Cancer Father        ?stomach ca   Cancer Paternal Grandmother        colon   Hypertension Paternal Grandmother    Stroke Paternal Grandmother    Diabetes Paternal Aunt    Hypertension Maternal Grandmother    Thyroid disease Maternal Grandmother     Social History   Socioeconomic History   Marital status: Single    Spouse name: Not on file   Number of  children: 1  Years of education: Not on file   Highest education level: Not on file  Occupational History   Occupation: Nurse with [employer]  Tobacco Use   Smoking status: Never    Passive exposure: Past   Smokeless tobacco: Never  Vaping Use   Vaping status: Never Used  Substance and Sexual Activity   Alcohol use: Yes    Alcohol/week: 0.0 standard drinks of alcohol    Comment: only occasionally--once a month   Drug use: No   Sexual activity: Yes    Partners: Female    Comment: ablation-Novasure, menarche 52yo  Other Topics Concern   Not on file  Social History Narrative   Not on file   Social Determinants of Health   Financial Resource Strain: Not on file  Food Insecurity: Not on file  Transportation Needs: Not on file  Physical Activity: Not on file  Stress: Not on file  Social Connections: Not on file  Intimate Partner Violence: Not on file    Review of Systems  All other systems reviewed and are negative.   PHYSICAL EXAMINATION:   BP 126/80 (BP Location: Right Arm, Patient Position: Sitting, Cuff Size: Normal)   Pulse 91   Ht 5' 2.5" (1.588 m)   Wt 132 lb (59.9 kg)   SpO2 99%   BMI 23.76 kg/m     General appearance: alert, cooperative and appears stated age. Patient examined in a supine and standing position.  Abdomen:  soft, no masses, nontender.  Groin:  no palpable inguinal lymph nodes.   Pelvic:  left suprapubic region with 2 cm soft mass noted in the standing position.                 ASSESSMENT:  Left groin mass.  I suspect this is a hernia.  Abdominal distention.  Uncertain etiology. No acute abdomen.  PLAN:  Information on inguinal and femoral hernias to patient. We discussed evaluation through ultrasound or CT scan.  Due to her abdominal distention, will proceed with CT of abdomen and pelvis with contrast.  I would recommend consultation with a surgeon following the CT.  We talked about heavy lifting and straining as risk factors for a  hernia and activities that can worsen the hernia.  Fu prn.

## 2023-06-15 NOTE — Telephone Encounter (Signed)
I recommend oral contrast.

## 2023-06-15 NOTE — Patient Instructions (Signed)
Femoral Hernia, Adult  A hernia happens when an organ or tissue inside your body pushes out through a weak spot in your muscles. This makes a bulge. A femoral hernia is found in your upper thigh near your groin. The bulge may be made up of fat tissue or part of your intestine. You may be born with this type of hernia, but it may not cause symptoms until you're an adult. Or you may get a hernia as you get older. It tends to get worse over time. It won't go away on its own. What are the causes? The cause of a femoral hernia may not be known. It may be brought on by: Coughing. Straining the muscles in your abdomen. Carrying, pushing, or lifting heavy things. Straining when you poop. What increases the risk? You're more likely to get a femoral hernia if: You're female. You smoke or have lung disease. You're often constipated. This means you have trouble pooping. You strain when you pee. You're overweight. You're older than 60 years. What are the signs or symptoms? The main symptom is a bulge, but the bulge may not always be seen. It may grow bigger or be easier to see when you cough or strain. In females, the bulge may form in the labia. These are the folds of skin on the outside of the genitals. If you can push the bulge back into your body, it may not cause pain. It may just feel a little numb. But if you can't push it in, it may lose its blood supply. This can cause: Sharp or severe pain. Nausea and vomiting. Redness or a dark color near the hernia. How is this diagnosed? A hernia may be diagnosed based on your symptoms, medical history, and an exam. Your health care provider may ask you to cough or move in a way that makes the bulge easier to see. You may also have tests done. These may include: Ultrasound. CT scan. How is this treated? Surgery is the only treatment for a femoral hernia. If the hernia has lost its blood supply, you will need surgery right away. Follow these  instructions at home: Activity You may have to avoid lifting. Ask your provider how much you can safely lift. Return to your normal activities as told by your provider. Ask your provider what activities are safe for you. Managing constipation You may need to take these actions to prevent or treat constipation. Drink enough fluid to keep your pee (urine) pale yellow. Take over-the-counter or prescription medicines. Eat foods that are high in fiber, such as beans, whole grains, and fresh fruits and vegetables. Limit foods that are high in fat and processed sugars, such as fried or sweet foods. General instructions Take over-the-counter and prescription medicines only as told by your provider. Follow instructions from your provider about what you may eat and drink. If you're overweight, work with your provider to lose weight. Do not use any products that contain nicotine or tobacco. These products include cigarettes, chewing tobacco, and vaping devices, such as e-cigarettes. If you need help quitting, ask your provider. Contact a health care provider if: Your hernia becomes painful. Your hernia gets larger. You have trouble pooping. You strain when you pee. Get help right away if: Your pain gets worse all of a sudden. You have pain along with any of these symptoms: Chills. A fever. Nausea and vomiting. The bulge turns: Dark. Red. Painful to touch. These symptoms may be an emergency. Get help right away. Call  911. Do not wait to see if the symptoms will go away. Do not drive yourself to the hospital. This information is not intended to replace advice given to you by your health care provider. Make sure you discuss any questions you have with your health care provider. Document Revised: 10/06/2022 Document Reviewed: 10/06/2022 Elsevier Patient Education  2024 Elsevier Inc.   Inguinal Hernia, Adult An inguinal hernia develops when fat or the intestines push through a weak spot in a  muscle where the leg meets the lower abdomen (groin). This creates a bulge. This kind of hernia could also be: In the scrotum, if you are female. In folds of skin around the vagina, if you are female. There are three types of inguinal hernias: Hernias that can be pushed back into the abdomen (are reducible). This type rarely causes pain. Hernias that are not reducible (are incarcerated). Hernias that are not reducible and lose their blood supply (are strangulated). This type of hernia requires emergency surgery. What are the causes? This condition is caused by having a weak spot in the muscles or tissues in your groin. This develops over time. The hernia may poke through the weak spot when you suddenly strain your lower abdominal muscles, such as when you: Lift a heavy object. Strain to have a bowel movement. Constipation can lead to straining. Cough. What increases the risk? This condition is more likely to develop in: Males. Pregnant females. People who: Are overweight. Work in jobs that require long periods of standing or heavy lifting. Have had an inguinal hernia before. Smoke or have lung disease. These factors can lead to long-term (chronic) coughing. What are the signs or symptoms? Symptoms may depend on the size of the hernia. Often, a small inguinal hernia has no symptoms. Symptoms of a larger hernia may include: A bulge in the groin area. This is easier to see when standing. It might not be visible when lying down. Pain or burning in the groin. This may get worse when lifting, straining, or coughing. A dull ache or a feeling of pressure in the groin. An unusual bulge in the scrotum, in males. Symptoms of a strangulated inguinal hernia may include: A bulge in your groin that is very painful and tender to the touch. A bulge that turns red or purple. Fever, nausea, and vomiting. Inability to have a bowel movement or to pass gas. How is this diagnosed? This condition is diagnosed  based on your symptoms, your medical history, and a physical exam. Your health care provider may feel your groin area and ask you to cough. How is this treated? Treatment depends on the size of your hernia and whether you have symptoms. If you do not have symptoms, your health care provider may have you watch your hernia carefully and have you come in for follow-up visits. If your hernia is large or if you have symptoms, you may need surgery to repair the hernia. Follow these instructions at home: Lifestyle Avoid lifting heavy objects. Avoid standing for long periods of time. Do not use any products that contain nicotine or tobacco. These products include cigarettes, chewing tobacco, and vaping devices, such as e-cigarettes. If you need help quitting, ask your health care provider. Maintain a healthy weight. Preventing constipation You may need to take these actions to prevent or treat constipation: Drink enough fluid to keep your urine pale yellow. Take over-the-counter or prescription medicines. Eat foods that are high in fiber, such as beans, whole grains, and fresh fruits and  vegetables. Limit foods that are high in fat and processed sugars, such as fried or sweet foods. General instructions You may try to push the hernia back in place by very gently pressing on it while lying down. Do not try to force the bulge back in if it will not push in easily. Watch your hernia for any changes in shape, size, or color. Get help right away if you notice any changes. Take over-the-counter and prescription medicines only as told by your health care provider. Keep all follow-up visits. This is important. Contact a health care provider if: You have a fever or chills. You develop new symptoms. Your symptoms get worse. Get help right away if: You have pain in your groin that suddenly gets worse. You have a bulge in your groin that: Suddenly gets bigger and does not get smaller. Becomes red or purple  or painful to the touch. You are a man and you have a sudden pain in your scrotum, or the size of your scrotum suddenly changes. You cannot push the hernia back in place by very gently pressing on it when you are lying down. You have nausea or vomiting that does not go away. You have a fast heartbeat. You cannot have a bowel movement or pass gas. These symptoms may represent a serious problem that is an emergency. Do not wait to see if the symptoms will go away. Get medical help right away. Call your local emergency services (911 in the [place] Summary An inguinal hernia develops when fat or the intestines push through a weak spot in a muscle where your leg meets your lower abdomen (groin). This condition is caused by having a weak spot in muscles or tissues in your groin. Symptoms may depend on the size of the hernia, and they may include pain or swelling in your groin. A small inguinal hernia often has no symptoms. Treatment may not be needed if you do not have symptoms. If you have symptoms or a large hernia, you may need surgery to repair the hernia. Avoid lifting heavy objects. Also, avoid standing for long periods of time. This information is not intended to replace advice given to you by your health care provider. Make sure you discuss any questions you have with your health care provider. Document Revised: 03/12/2020 Document Reviewed: 03/12/2020 Elsevier Patient Education  2024 [employer].

## 2023-06-15 NOTE — Telephone Encounter (Signed)
Spoke with [name] at [facility] Scheduling. Patient scheduled for CT abd/pelvis with contrast on 06/23/23 at 10AM, [facility], Entrance A. Arrive at 0945. Will need clarification if patient will need Oral contrast in addition to IV.   Dr. Edward Jolly -please advise.

## 2023-06-16 NOTE — Telephone Encounter (Signed)
[facility] Scheduling updated, patient should arrive at 0800 for oral prep.   Call placed to patient, left detailed message, ok per dpr. Advised of appt details. Return call to office if you have any additional questions. If you need to make any changes to this appt, contact [facility] Scheduling at [phone].   Routing to provider for final review. Patient is agreeable to disposition. Will close encounter.

## 2023-06-17 ENCOUNTER — Other Ambulatory Visit (HOSPITAL_BASED_OUTPATIENT_CLINIC_OR_DEPARTMENT_OTHER): Payer: Self-pay

## 2023-06-17 MED ORDER — LISDEXAMFETAMINE DIMESYLATE 30 MG PO CAPS
30.0000 mg | ORAL_CAPSULE | Freq: Every morning | ORAL | 0 refills | Status: DC
  Filled 2023-06-17: qty 30, 30d supply, fill #0

## 2023-06-18 ENCOUNTER — Other Ambulatory Visit (HOSPITAL_BASED_OUTPATIENT_CLINIC_OR_DEPARTMENT_OTHER): Payer: Self-pay

## 2023-06-21 ENCOUNTER — Other Ambulatory Visit (HOSPITAL_BASED_OUTPATIENT_CLINIC_OR_DEPARTMENT_OTHER): Payer: Self-pay

## 2023-06-22 ENCOUNTER — Other Ambulatory Visit (HOSPITAL_BASED_OUTPATIENT_CLINIC_OR_DEPARTMENT_OTHER): Payer: Self-pay

## 2023-06-23 ENCOUNTER — Ambulatory Visit (HOSPITAL_COMMUNITY)
Admission: RE | Admit: 2023-06-23 | Discharge: 2023-06-23 | Disposition: A | Discharge status: Home / Self Care | Payer: 59 | Source: Ambulatory Visit | Attending: Obstetrics and Gynecology | Admitting: Obstetrics and Gynecology

## 2023-06-23 DIAGNOSIS — K4090 Unilateral inguinal hernia, without obstruction or gangrene, not specified as recurrent: Secondary | ICD-10-CM | POA: Diagnosis not present

## 2023-06-23 DIAGNOSIS — R1909 Other intra-abdominal and pelvic swelling, mass and lump: Secondary | ICD-10-CM | POA: Insufficient documentation

## 2023-06-23 DIAGNOSIS — R109 Unspecified abdominal pain: Secondary | ICD-10-CM | POA: Diagnosis not present

## 2023-06-23 DIAGNOSIS — R140 Abdominal distension (gaseous): Secondary | ICD-10-CM | POA: Diagnosis not present

## 2023-06-23 MED ORDER — IOHEXOL 350 MG/ML SOLN
75.0000 mL | Freq: Once | INTRAVENOUS | Status: AC | PRN
  Administered 2023-06-23: 75 mL via INTRAVENOUS

## 2023-06-27 ENCOUNTER — Encounter: Payer: Self-pay | Admitting: Obstetrics and Gynecology

## 2023-07-16 ENCOUNTER — Other Ambulatory Visit (HOSPITAL_BASED_OUTPATIENT_CLINIC_OR_DEPARTMENT_OTHER): Payer: Self-pay

## 2023-07-16 ENCOUNTER — Other Ambulatory Visit: Payer: Self-pay

## 2023-07-16 MED ORDER — LISDEXAMFETAMINE DIMESYLATE 30 MG PO CAPS
30.0000 mg | ORAL_CAPSULE | Freq: Every morning | ORAL | 0 refills | Status: AC
  Filled 2023-07-16: qty 30, 30d supply, fill #0

## 2023-08-03 DIAGNOSIS — Z1231 Encounter for screening mammogram for malignant neoplasm of breast: Secondary | ICD-10-CM | POA: Diagnosis not present

## 2023-08-04 ENCOUNTER — Other Ambulatory Visit (HOSPITAL_BASED_OUTPATIENT_CLINIC_OR_DEPARTMENT_OTHER): Payer: Self-pay

## 2023-08-05 ENCOUNTER — Other Ambulatory Visit (HOSPITAL_BASED_OUTPATIENT_CLINIC_OR_DEPARTMENT_OTHER): Payer: Self-pay

## 2023-08-05 DIAGNOSIS — K219 Gastro-esophageal reflux disease without esophagitis: Secondary | ICD-10-CM | POA: Diagnosis not present

## 2023-08-05 DIAGNOSIS — K449 Diaphragmatic hernia without obstruction or gangrene: Secondary | ICD-10-CM | POA: Diagnosis not present

## 2023-08-06 ENCOUNTER — Encounter: Payer: Self-pay | Admitting: Obstetrics and Gynecology

## 2023-08-17 ENCOUNTER — Other Ambulatory Visit (HOSPITAL_BASED_OUTPATIENT_CLINIC_OR_DEPARTMENT_OTHER): Payer: Self-pay

## 2023-08-17 ENCOUNTER — Other Ambulatory Visit: Payer: Self-pay | Admitting: Obstetrics and Gynecology

## 2023-08-18 ENCOUNTER — Encounter: Payer: Self-pay | Admitting: Obstetrics and Gynecology

## 2023-08-18 NOTE — Telephone Encounter (Signed)
Med refill request: HRT Patches Last AEX: 08/27/2022-BS Next AEX: Nothing scheduled, recall in place/sent per EMR. Last MMG (if hormonal med): 08/03/2023-neg birads 1, Cat B Refill authorized: rx pend.  Msg sent to appt desk to schedule appt.

## 2023-08-19 ENCOUNTER — Other Ambulatory Visit (HOSPITAL_BASED_OUTPATIENT_CLINIC_OR_DEPARTMENT_OTHER): Payer: Self-pay

## 2023-08-19 ENCOUNTER — Other Ambulatory Visit: Payer: Self-pay | Admitting: Obstetrics and Gynecology

## 2023-08-19 MED ORDER — LISDEXAMFETAMINE DIMESYLATE 30 MG PO CAPS
30.0000 mg | ORAL_CAPSULE | Freq: Every morning | ORAL | 0 refills | Status: AC
  Filled 2023-08-19: qty 20, 20d supply, fill #0
  Filled 2023-08-19: qty 10, 10d supply, fill #0

## 2023-08-19 MED ORDER — ESTRADIOL 0.0375 MG/24HR TD PTTW
1.0000 | MEDICATED_PATCH | TRANSDERMAL | 1 refills | Status: DC
  Filled 2023-08-19: qty 24, 84d supply, fill #0
  Filled 2023-10-26 – 2023-10-28 (×2): qty 24, 84d supply, fill #1

## 2023-08-19 NOTE — Telephone Encounter (Signed)
[name], CMA Left message for patient to call and schedule appointment.

## 2023-08-19 NOTE — Telephone Encounter (Signed)
Medication refill request: estradiol patch  Last AEX:  08/27/22 Next AEX: 12/21/23 Last MMG (if hormonal medication request): 08/03/23 bi-rads 1 neg  Refill authorized: please advise

## 2023-08-20 ENCOUNTER — Other Ambulatory Visit (HOSPITAL_BASED_OUTPATIENT_CLINIC_OR_DEPARTMENT_OTHER): Payer: Self-pay

## 2023-09-16 DIAGNOSIS — R2991 Unspecified symptoms and signs involving the musculoskeletal system: Secondary | ICD-10-CM | POA: Diagnosis not present

## 2023-09-16 DIAGNOSIS — F902 Attention-deficit hyperactivity disorder, combined type: Secondary | ICD-10-CM | POA: Diagnosis not present

## 2023-09-16 DIAGNOSIS — K219 Gastro-esophageal reflux disease without esophagitis: Secondary | ICD-10-CM | POA: Diagnosis not present

## 2023-09-27 ENCOUNTER — Other Ambulatory Visit (HOSPITAL_BASED_OUTPATIENT_CLINIC_OR_DEPARTMENT_OTHER): Payer: Self-pay

## 2023-09-27 MED ORDER — LISDEXAMFETAMINE DIMESYLATE 30 MG PO CAPS
30.0000 mg | ORAL_CAPSULE | Freq: Every morning | ORAL | 0 refills | Status: AC
  Filled 2023-11-29: qty 30, 30d supply, fill #0

## 2023-09-27 MED ORDER — LISDEXAMFETAMINE DIMESYLATE 30 MG PO CAPS
ORAL_CAPSULE | ORAL | 0 refills | Status: AC
  Filled 2023-09-27: qty 30, 30d supply, fill #0

## 2023-09-27 MED ORDER — LISDEXAMFETAMINE DIMESYLATE 30 MG PO CAPS
30.0000 mg | ORAL_CAPSULE | Freq: Every morning | ORAL | 0 refills | Status: AC
  Filled 2023-10-26: qty 30, 30d supply, fill #0

## 2023-09-28 ENCOUNTER — Other Ambulatory Visit: Payer: Self-pay

## 2023-09-28 ENCOUNTER — Other Ambulatory Visit (HOSPITAL_BASED_OUTPATIENT_CLINIC_OR_DEPARTMENT_OTHER): Payer: Self-pay

## 2023-10-06 ENCOUNTER — Other Ambulatory Visit (HOSPITAL_BASED_OUTPATIENT_CLINIC_OR_DEPARTMENT_OTHER): Payer: Self-pay

## 2023-10-10 ENCOUNTER — Telehealth: Admitting: Family Medicine

## 2023-10-10 DIAGNOSIS — B9689 Other specified bacterial agents as the cause of diseases classified elsewhere: Secondary | ICD-10-CM

## 2023-10-10 DIAGNOSIS — J019 Acute sinusitis, unspecified: Secondary | ICD-10-CM | POA: Diagnosis not present

## 2023-10-10 MED ORDER — AZITHROMYCIN 250 MG PO TABS: ORAL_TABLET | ORAL | 0 refills | Status: AC

## 2023-10-10 MED ORDER — PREDNISONE 10 MG (21) PO TBPK: ORAL_TABLET | ORAL | 0 refills | Status: DC

## 2023-10-10 NOTE — Progress Notes (Signed)
 E-Visit for Sinus Problems  We are sorry that you are not feeling well.  Here is how we plan to help!  Based on what you have shared with me it looks like you have sinusitis.  Sinusitis is inflammation and infection in the sinus cavities of the head.  Based on your presentation I believe you most likely have Acute Bacterial Sinusitis.  This is an infection caused by bacteria and is treated with antibiotics. I have prescribed zithromax and prednisone.  You may use an oral decongestant such as Mucinex D or if you have glaucoma or high blood pressure use plain Mucinex. Saline nasal spray help and can safely be used as often as needed for congestion.  If you develop worsening sinus pain, fever or notice severe headache and vision changes, or if symptoms are not better after completion of antibiotic, please schedule an appointment with a health care provider.    Sinus infections are not as easily transmitted as other respiratory infection, however we still recommend that you avoid close contact with loved ones, especially the very young and elderly.  Remember to wash your hands thoroughly throughout the day as this is the number one way to prevent the spread of infection!  Home Care: Only take medications as instructed by your medical team. Complete the entire course of an antibiotic. Do not take these medications with alcohol. A steam or ultrasonic humidifier can help congestion.  You can place a towel over your head and breathe in the steam from hot water coming from a faucet. Avoid close contacts especially the very young and the elderly. Cover your mouth when you cough or sneeze. Always remember to wash your hands.  Get Help Right Away If: You develop worsening fever or sinus pain. You develop a severe head ache or visual changes. Your symptoms persist after you have completed your treatment plan.  Make sure you Understand these instructions. Will watch your condition. Will get help right  away if you are not doing well or get worse.  Thank you for choosing an e-visit.  Your e-visit answers were reviewed by a board certified advanced clinical practitioner to complete your personal care plan. Depending upon the condition, your plan could have included both over the counter or prescription medications.  Please review your pharmacy choice. Make sure the pharmacy is open so you can pick up prescription now. If there is a problem, you may contact your provider through [employer] and have the prescription routed to another pharmacy.  Your safety is important to [place]. If you have drug allergies check your prescription carefully.   For the next 24 hours you can use MyChart to ask questions about today's visit, request a non-urgent call back, or ask for a work or school excuse. You will get an email in the next two days asking about your experience. I hope that your e-visit has been valuable and will speed your recovery.   have provided 5 minutes of non face to face time during this encounter for chart review and documentation.

## 2023-10-24 ENCOUNTER — Other Ambulatory Visit: Payer: Self-pay | Admitting: Obstetrics and Gynecology

## 2023-10-25 MED ORDER — PROGESTERONE MICRONIZED 100 MG PO CAPS
100.0000 mg | ORAL_CAPSULE | Freq: Every day | ORAL | 0 refills | Status: DC
  Filled 2023-10-25: qty 90, 90d supply, fill #0

## 2023-10-25 NOTE — Telephone Encounter (Signed)
 Medication refill request: progesterone 100mg  Last AEX:  08-27-22 Next AEX: 12-29-23 Last MMG (if hormonal medication request): 08-03-23 birads 1:neg Refill authorized: please approve or deny as appropriate

## 2023-10-26 ENCOUNTER — Other Ambulatory Visit: Payer: Self-pay

## 2023-10-26 ENCOUNTER — Other Ambulatory Visit (HOSPITAL_BASED_OUTPATIENT_CLINIC_OR_DEPARTMENT_OTHER): Payer: Self-pay

## 2023-10-27 ENCOUNTER — Other Ambulatory Visit (HOSPITAL_BASED_OUTPATIENT_CLINIC_OR_DEPARTMENT_OTHER): Payer: Self-pay

## 2023-11-05 ENCOUNTER — Encounter

## 2023-11-22 DIAGNOSIS — H5711 Ocular pain, right eye: Secondary | ICD-10-CM | POA: Diagnosis not present

## 2023-11-22 DIAGNOSIS — G514 Facial myokymia: Secondary | ICD-10-CM | POA: Diagnosis not present

## 2023-11-22 DIAGNOSIS — H2513 Age-related nuclear cataract, bilateral: Secondary | ICD-10-CM | POA: Diagnosis not present

## 2023-11-22 DIAGNOSIS — H04123 Dry eye syndrome of bilateral lacrimal glands: Secondary | ICD-10-CM | POA: Diagnosis not present

## 2023-11-28 ENCOUNTER — Other Ambulatory Visit (HOSPITAL_BASED_OUTPATIENT_CLINIC_OR_DEPARTMENT_OTHER): Payer: Self-pay

## 2023-11-29 ENCOUNTER — Other Ambulatory Visit (HOSPITAL_BASED_OUTPATIENT_CLINIC_OR_DEPARTMENT_OTHER): Payer: Self-pay

## 2023-12-21 ENCOUNTER — Ambulatory Visit: Payer: 59 | Admitting: Obstetrics and Gynecology

## 2023-12-29 ENCOUNTER — Ambulatory Visit: Payer: 59 | Admitting: Obstetrics and Gynecology

## 2023-12-29 NOTE — Progress Notes (Deleted)
 53 y.o. G41P1001 Single Caucasian female here for annual exam.    PCP: Faustina Hood, MD   No LMP recorded. Patient has had an ablation.           Sexually active: Yes.    The current method of family planning is Ablation.    Menopausal hormone therapy:  Vivelle   Exercising: {yes no:314532}  {types:19826} Smoker:  no  OB History  Gravida Para Term Preterm AB Living  1 1 1   1   SAB IAB Ectopic Multiple Live Births          # Outcome Date GA Lbr Len/2nd Weight Sex Type Anes PTL Lv  1 Term              HEALTH MAINTENANCE: Last 2 paps:  08/27/22 neg HR HPV neg, 09/15/17 neg HPV neg History of abnormal Pap or positive HPV:  no Mammogram:   08/03/23 Breast Density Cat B, BIRADS Cat 1 neg  Colonoscopy:  n/a Bone Density:  07/16/22  Result  osteopenia    Immunization History  Administered Date(s) Administered   Tdap 09/15/2017      reports that she has never smoked. She has been exposed to tobacco smoke. She has never used smokeless tobacco. She reports current alcohol use. She reports that she does not use drugs.  Past Medical History:  Diagnosis Date   Abdominal pain    ADHD    Allergic rhinitis    Benign neoplasm of colon    Chronic fatigue    Constipation    COVID 10/2020   GERD (gastroesophageal reflux disease)    Headache(784.0)    otc meds prn   Hearing loss    Hiatal hernia    Hyperthyroidism    no meds   IBS (irritable bowel syndrome)    Inguinal hernia 05/2023   left   Irregular heart beat    history - no treatment  Dr Anastasia Balo   Low vitamin D  level    Polyp of stomach and duodenum    PONV (postoperative nausea and vomiting)    Rectal pain    Right ovarian cyst 2016   CT scan - 1 cm, possible dermoid.   Sinus tachycardia    SVT (supraventricular tachycardia) (HCC)    Thrombocytosis    Urinary incontinence    urgency and night urination   Vaginal cyst 2019   posterior vaginal wall - 8 mm   Vaginal cyst     Past Surgical History:  Procedure  Laterality Date   ANTERIOR AND POSTERIOR REPAIR     with biological grafts   BREAST SURGERY  04/07/2016   Lt.Br.Bx--Fibroadenoma   DIAGNOSTIC LAPAROSCOPY     DILATION AND CURETTAGE OF UTERUS     ENDOMETRIAL ABLATION  05/2011   INCONTINENCE SURGERY     [facility] midurethral sling and cystoscopy   LAPAROSCOPY  06/12/2011   Procedure: LAPAROSCOPY OPERATIVE;  Surgeon: Lawton Price;  Location: [facility];  Service: Gynecology;  Laterality: N/A;   Left Ovarian Cystectomy   NOSE SURGERY     SHOULDER SURGERY     bilateral left x 2, right x 1 surgery   svd      x 1   UPPER GASTROINTESTINAL ENDOSCOPY     WISDOM TOOTH EXTRACTION      Current Outpatient Medications  Medication Sig Dispense Refill   cetirizine  (ZYRTEC  ALLERGY) 10 MG tablet Take 1 tablet (10 mg total) by mouth daily. 90 tablet 1   Cyanocobalamin  (VITAMIN B12  PO) Take 1 each by mouth as needed. gummie      cyclobenzaprine (FLEXERIL) 10 MG tablet Take 10 mg by mouth as needed.  0   diclofenac sodium (VOLTAREN) 1 % GEL as needed.  0   diphenhydrAMINE  (BENADRYL ) 50 MG capsule Take one capsule 1 hour prior to scan. 1 capsule 0   estradiol  (VIVELLE -DOT) 0.0375 MG/24HR Place 1 patch onto the skin 2 (two) times a week. 24 patch 1   fluticasone  (FLONASE ) 50 MCG/ACT nasal spray Place 2 sprays into both nostrils daily. 16 g 6   lisdexamfetamine (VYVANSE ) 30 MG capsule Take 1 capsule by mouth...may fill 30 days after last refill every morning 30 capsule 0   lisdexamfetamine (VYVANSE ) 30 MG capsule Take 1 capsule (30 mg total) by mouth every morning. 30 capsule 0   lisdexamfetamine (VYVANSE ) 30 MG capsule 1 capsule in the morning Orally Once a day 30 days 30 capsule 0   lisdexamfetamine (VYVANSE ) 30 MG capsule Take 1 capsule (30 mg total) by mouth every morning. 30 capsule 0   lisdexamfetamine (VYVANSE ) 30 MG capsule Take 1 capsule (30 mg total) by mouth in the morning. 30 capsule 0   lisdexamfetamine (VYVANSE ) 30 MG capsule 1 capsule in the  morning Orally...may fill 30 days after last refill Once a day 30 capsule 0   lisdexamfetamine (VYVANSE ) 30 MG capsule Take 1 capsule (30 mg total) by mouth in the morning. 30 capsule 0   lisdexamfetamine (VYVANSE ) 30 MG capsule Take 1 capsule (30 mg total) by mouth in the morning. 30 capsule 0   meloxicam  (MOBIC ) 15 MG tablet Take 1 tablet (15 mg total) by mouth daily. 30 tablet 1   montelukast  (SINGULAIR ) 10 MG tablet Take 1 tablet (10 mg total) by mouth daily. 30 tablet 0   Multiple Vitamins-Minerals (MULTIVITAMIN WITH MINERALS) tablet Take 1 tablet by mouth daily.       omeprazole  (PRILOSEC) 20 MG capsule Take 1 capsule (20 mg total) by mouth daily. 90 capsule 3   omeprazole -sodium bicarbonate (ZEGERID) 40-1100 MG per capsule Take 1 capsule by mouth daily before breakfast.       oxyCODONE -acetaminophen  (PERCOCET/ROXICET) 5-325 MG tablet Take 1 tablet by mouth every 4 (four) hours. Prn pain  0   predniSONE  (STERAPRED UNI-PAK 21 TAB) 10 MG (21) TBPK tablet 10 mg- 12 day dose pack as directed, no refills. 1 each 0   progesterone  (PROMETRIUM ) 100 MG capsule Take 1 capsule (100 mg total) by mouth daily at bedtime. 90 capsule 0   triamcinolone  cream (KENALOG ) 0.5 % Apply 1 application topically 2 (two) times daily to affected area 30 g 1   VYVANSE  30 MG capsule Take 1 capsule (30 mg total) by mouth in the morning. 30 capsule 0   VYVANSE  30 MG capsule Take 1 capsule (30 mg total) by mouth daily. 30 capsule 0   No current facility-administered medications for this visit.    ALLERGIES: Gadolinium derivatives, Augmentin  [amoxicillin -pot clavulanate], Buprenorphine hcl, Codeine, Morphine and codeine, and Vicodin [hydrocodone-acetaminophen ]  Family History  Problem Relation Age of Onset   Fibromyalgia Mother    Thyroid  disease Mother    Seizures Father        d/t head injury   Cancer Father        ?stomach ca   Cancer Paternal Grandmother        colon   Hypertension Paternal Grandmother     Stroke Paternal Grandmother    Diabetes Paternal Aunt    Hypertension Maternal  Grandmother    Thyroid  disease Maternal Grandmother     Review of Systems  PHYSICAL EXAM:  There were no vitals taken for this visit.    General appearance: alert, cooperative and appears stated age Head: normocephalic, without obvious abnormality, atraumatic Neck: no adenopathy, supple, symmetrical, trachea midline and thyroid  normal to inspection and palpation Lungs: clear to auscultation bilaterally Breasts: normal appearance, no masses or tenderness, No nipple retraction or dimpling, No nipple discharge or bleeding, No axillary adenopathy Heart: regular rate and rhythm Abdomen: soft, non-tender; no masses, no organomegaly Extremities: extremities normal, atraumatic, no cyanosis or edema Skin: skin color, texture, turgor normal. No rashes or lesions Lymph nodes: cervical, supraclavicular, and axillary nodes normal. Neurologic: grossly normal  Pelvic: External genitalia:  no lesions              No abnormal inguinal nodes palpated.              Urethra:  normal appearing urethra with no masses, tenderness or lesions              Bartholins and Skenes: normal                 Vagina: normal appearing vagina with normal color and discharge, no lesions              Cervix: no lesions              Pap taken: {yes no:314532} Bimanual Exam:  Uterus:  normal size, contour, position, consistency, mobility, non-tender              Adnexa: no mass, fullness, tenderness              Rectal exam: {yes no:314532}.  Confirms.              Anus:  normal sphincter tone, no lesions  Chaperone was present for exam:  {BSCHAPERONE:31226::"Emily F, CMA"}  ASSESSMENT: Well woman visit with gynecologic exam.  PHQ-9: ***  ***  PLAN: Mammogram screening discussed. Self breast awareness reviewed. Pap and HRV collected:  {yes no:314532} Guidelines for Calcium, Vitamin D , regular exercise program including  cardiovascular and weight bearing exercise. Medication refills:  *** {LABS (Optional):23779} Follow up:  ***    Additional counseling given.  {yes [id number]. ***  total time was spent for this patient encounter, including preparation, face-to-face counseling with the patient, coordination of care, and documentation of the encounter in addition to doing the well woman visit with gynecologic exam.

## 2023-12-30 ENCOUNTER — Other Ambulatory Visit (HOSPITAL_BASED_OUTPATIENT_CLINIC_OR_DEPARTMENT_OTHER): Payer: Self-pay

## 2023-12-30 MED ORDER — LISDEXAMFETAMINE DIMESYLATE 30 MG PO CAPS
30.0000 mg | ORAL_CAPSULE | Freq: Every day | ORAL | 0 refills | Status: AC
  Filled 2024-01-29: qty 30, 30d supply, fill #0

## 2023-12-30 MED ORDER — LISDEXAMFETAMINE DIMESYLATE 30 MG PO CAPS
30.0000 mg | ORAL_CAPSULE | Freq: Every day | ORAL | 0 refills | Status: AC
  Filled 2024-04-18: qty 30, 30d supply, fill #0

## 2023-12-30 MED ORDER — TRIAMCINOLONE ACETONIDE 0.5 % EX CREA
TOPICAL_CREAM | Freq: Two times a day (BID) | CUTANEOUS | 1 refills | Status: AC
  Filled 2023-12-30: qty 15, 30d supply, fill #0

## 2023-12-30 MED ORDER — FLUTICASONE PROPIONATE 50 MCG/ACT NA SUSP
2.0000 | Freq: Every day | NASAL | 3 refills | Status: DC
  Filled 2023-12-30: qty 16, 30d supply, fill #0
  Filled 2024-01-25: qty 16, 30d supply, fill #1
  Filled 2024-03-14: qty 16, 30d supply, fill #2
  Filled 2024-04-17: qty 16, 30d supply, fill #3

## 2023-12-30 MED ORDER — LISDEXAMFETAMINE DIMESYLATE 30 MG PO CAPS
30.0000 mg | ORAL_CAPSULE | Freq: Every day | ORAL | 0 refills | Status: AC
  Filled 2023-12-30: qty 30, 30d supply, fill #0

## 2024-01-04 ENCOUNTER — Other Ambulatory Visit (HOSPITAL_BASED_OUTPATIENT_CLINIC_OR_DEPARTMENT_OTHER): Payer: Self-pay

## 2024-01-05 ENCOUNTER — Other Ambulatory Visit (HOSPITAL_BASED_OUTPATIENT_CLINIC_OR_DEPARTMENT_OTHER): Payer: Self-pay

## 2024-01-05 MED ORDER — AMOXICILLIN 500 MG PO CAPS
500.0000 mg | ORAL_CAPSULE | Freq: Three times a day (TID) | ORAL | 0 refills | Status: AC
  Filled 2024-01-05: qty 30, 10d supply, fill #0

## 2024-01-06 ENCOUNTER — Other Ambulatory Visit (HOSPITAL_BASED_OUTPATIENT_CLINIC_OR_DEPARTMENT_OTHER): Payer: Self-pay

## 2024-01-11 ENCOUNTER — Ambulatory Visit: Payer: Self-pay | Admitting: Nurse Practitioner

## 2024-01-24 ENCOUNTER — Other Ambulatory Visit: Payer: Self-pay | Admitting: Obstetrics and Gynecology

## 2024-01-24 ENCOUNTER — Other Ambulatory Visit (HOSPITAL_BASED_OUTPATIENT_CLINIC_OR_DEPARTMENT_OTHER): Payer: Self-pay

## 2024-01-25 ENCOUNTER — Other Ambulatory Visit: Payer: Self-pay

## 2024-01-25 ENCOUNTER — Other Ambulatory Visit (HOSPITAL_BASED_OUTPATIENT_CLINIC_OR_DEPARTMENT_OTHER): Payer: Self-pay

## 2024-01-25 ENCOUNTER — Other Ambulatory Visit: Payer: Self-pay | Admitting: Obstetrics and Gynecology

## 2024-01-26 ENCOUNTER — Other Ambulatory Visit (HOSPITAL_BASED_OUTPATIENT_CLINIC_OR_DEPARTMENT_OTHER): Payer: Self-pay

## 2024-01-26 ENCOUNTER — Encounter: Payer: Self-pay | Admitting: Radiology

## 2024-01-26 ENCOUNTER — Ambulatory Visit (INDEPENDENT_AMBULATORY_CARE_PROVIDER_SITE_OTHER): Admitting: Radiology

## 2024-01-26 VITALS — BP 102/60 | HR 79 | Ht 63.78 in | Wt 131.6 lb

## 2024-01-26 DIAGNOSIS — Z79890 Hormone replacement therapy (postmenopausal): Secondary | ICD-10-CM

## 2024-01-26 DIAGNOSIS — Z1331 Encounter for screening for depression: Secondary | ICD-10-CM

## 2024-01-26 DIAGNOSIS — Z01419 Encounter for gynecological examination (general) (routine) without abnormal findings: Secondary | ICD-10-CM

## 2024-01-26 MED ORDER — BIJUVA 0.5-100 MG PO CAPS: 1.0000 | ORAL_CAPSULE | Freq: Every evening | ORAL | 4 refills | Status: AC

## 2024-01-26 NOTE — Telephone Encounter (Signed)
 Med refill request: Vivelle   Last AEX:01/26/24 Next AEX:02/06/25 Last MMG (if hormonal med)08/03/23 birads cat 1 neg  Refill authorized: Rx refused due to change in therapy made by Shasta, NP on 01/26/24. Routing to provider for review

## 2024-01-26 NOTE — Progress Notes (Signed)
   [name] 01-Jul-1971 [id number]   History: Postmenopausal 53 y.o. presents for annual exam. Struggling with HRT because the progesterone  sticks together and has trouble getting the patch to stick, would like another option. Found to have a small hernia, has not followed up with general surgery yet. Works two jobs, does a Animal nutritionist. No new gyn concerns.   Gynecologic History Postmenopausal Last Pap: 08/2022. Results were: normal Last mammogram: 07/2023. Results were: normal Last colonoscopy: 2021   Obstetric History OB History  Gravida Para Term Preterm AB Living  1 1 1   1   SAB IAB Ectopic Multiple Live Births          # Outcome Date GA Lbr Len/2nd Weight Sex Type Anes PTL Lv  1 Term                01/26/2024   12:38 PM  Depression screen PHQ 2/9  Decreased Interest 0  Down, Depressed, Hopeless 0  PHQ - 2 Score 0     The following portions of the patient's history were reviewed and updated as appropriate: allergies, current medications, past family history, past medical history, past social history, past surgical history, and problem list.  Review of Systems Pertinent items noted in HPI and remainder of comprehensive ROS otherwise negative.  Past medical history, past surgical history, family history and social history were all reviewed and documented in the EPIC chart.  Exam:  Vitals:   01/26/24 1208 01/26/24 1217  BP: 130/68 102/60  Pulse: 79   SpO2: 99%   Weight: 131 lb 9.6 oz (59.7 kg)   Height: 5' 3.78 (1.62 m)    Body mass index is 22.75 kg/m.  General appearance:  Normal Thyroid :  Symmetrical, normal in size, without palpable masses or nodularity. Respiratory  Auscultation:  Clear without wheezing or rhonchi Cardiovascular  Auscultation:  Regular rate, without rubs, murmurs or gallops  Edema/varicosities:  Not grossly evident Abdominal  Soft,nontender, without masses, guarding or rebound.  Liver/spleen:  No organomegaly noted  Hernia:   None appreciated  Skin  Inspection:  Grossly normal Breasts: Examined lying and sitting.   Right: Without masses, retractions, nipple discharge or axillary adenopathy.   Left: Without masses, retractions, nipple discharge or axillary adenopathy. Genitourinary   Inguinal/mons:  Normal without inguinal adenopathy  External genitalia:  Normal appearing vulva with no masses, tenderness, or lesions  BUS/Urethra/Skene's glands:  Normal  Vagina:  Normal appearing with normal color and discharge, no lesions. Atrophy: mild   Cervix:  Normal appearing without discharge or lesions  Uterus:  Normal in size, shape and contour.  Midline and mobile, nontender  Adnexa/parametria:     Rt: Normal in size, without masses or tenderness.   Lt: Normal in size, without masses or tenderness.  Anus and perineum: Normal    [name], [name] present for exam  Assessment/Plan:   1. Well woman exam with routine gynecological exam (Primary) Pap normal 2024  2. Hormone replacement therapy (HRT) Will switch to bijuva - Estradiol -Progesterone  (BIJUVA) 0.5-100 MG CAPS; Take 1 capsule by mouth at bedtime.  Dispense: 84 capsule; Refill: 4  3. Depression screen negative    Return in 1 year for annual or sooner prn.  Cabrini Ruggieri B [facility]NP-BC, 12:42 PM 01/26/2024

## 2024-01-26 NOTE — Telephone Encounter (Signed)
 Med refill request: Prometrium   Last AEX: 01/26/24 Next AEX: 02/06/25 Last MMG (if hormonal med) 08/03/23 birads cat 1 neg  Refill authorized: Rx refused due to change in therapy made by Shasta, NP on 01/26/24. Routing to provider for review

## 2024-01-27 ENCOUNTER — Other Ambulatory Visit (HOSPITAL_BASED_OUTPATIENT_CLINIC_OR_DEPARTMENT_OTHER): Payer: Self-pay

## 2024-01-29 ENCOUNTER — Other Ambulatory Visit (HOSPITAL_BASED_OUTPATIENT_CLINIC_OR_DEPARTMENT_OTHER): Payer: Self-pay

## 2024-02-03 ENCOUNTER — Other Ambulatory Visit (HOSPITAL_BASED_OUTPATIENT_CLINIC_OR_DEPARTMENT_OTHER): Payer: Self-pay

## 2024-02-23 ENCOUNTER — Other Ambulatory Visit (HOSPITAL_COMMUNITY): Payer: Self-pay

## 2024-02-23 MED ORDER — BIJUVA 0.5-100 MG PO CAPS
1.0000 | ORAL_CAPSULE | Freq: Every day | ORAL | 5 refills | Status: AC
  Filled 2024-02-23: qty 30, 30d supply, fill #0
  Filled 2024-03-28: qty 30, 30d supply, fill #1
  Filled 2024-04-17: qty 30, 30d supply, fill #2
  Filled 2024-04-21: qty 30, 30d supply, fill #0
  Filled 2024-04-24 – 2024-04-25 (×2): qty 90, 90d supply, fill #0
  Filled 2024-07-10: qty 90, 90d supply, fill #1

## 2024-02-24 ENCOUNTER — Other Ambulatory Visit (HOSPITAL_COMMUNITY): Payer: Self-pay

## 2024-02-24 ENCOUNTER — Other Ambulatory Visit: Payer: Self-pay

## 2024-03-14 ENCOUNTER — Other Ambulatory Visit: Payer: Self-pay | Admitting: Family Medicine

## 2024-03-14 ENCOUNTER — Other Ambulatory Visit (HOSPITAL_BASED_OUTPATIENT_CLINIC_OR_DEPARTMENT_OTHER): Payer: Self-pay

## 2024-03-14 ENCOUNTER — Other Ambulatory Visit (HOSPITAL_COMMUNITY): Payer: Self-pay

## 2024-03-14 ENCOUNTER — Other Ambulatory Visit: Payer: Self-pay

## 2024-03-14 DIAGNOSIS — F902 Attention-deficit hyperactivity disorder, combined type: Secondary | ICD-10-CM | POA: Diagnosis not present

## 2024-03-14 DIAGNOSIS — Z0000 Encounter for general adult medical examination without abnormal findings: Secondary | ICD-10-CM | POA: Diagnosis not present

## 2024-03-14 DIAGNOSIS — R519 Headache, unspecified: Secondary | ICD-10-CM | POA: Diagnosis not present

## 2024-03-14 DIAGNOSIS — E7800 Pure hypercholesterolemia, unspecified: Secondary | ICD-10-CM | POA: Diagnosis not present

## 2024-03-14 DIAGNOSIS — M2550 Pain in unspecified joint: Secondary | ICD-10-CM | POA: Diagnosis not present

## 2024-03-14 DIAGNOSIS — K219 Gastro-esophageal reflux disease without esophagitis: Secondary | ICD-10-CM | POA: Diagnosis not present

## 2024-03-14 DIAGNOSIS — E538 Deficiency of other specified B group vitamins: Secondary | ICD-10-CM | POA: Diagnosis not present

## 2024-03-14 MED ORDER — MELOXICAM 15 MG PO TABS
15.0000 mg | ORAL_TABLET | Freq: Every day | ORAL | 2 refills | Status: AC
  Filled 2024-03-14 – 2024-03-15 (×2): qty 30, 30d supply, fill #0
  Filled 2024-04-17: qty 30, 30d supply, fill #1
  Filled 2024-06-11: qty 30, 30d supply, fill #2

## 2024-03-15 ENCOUNTER — Other Ambulatory Visit (HOSPITAL_COMMUNITY): Payer: Self-pay

## 2024-03-15 ENCOUNTER — Other Ambulatory Visit (HOSPITAL_BASED_OUTPATIENT_CLINIC_OR_DEPARTMENT_OTHER): Payer: Self-pay

## 2024-03-15 MED ORDER — LISDEXAMFETAMINE DIMESYLATE 30 MG PO CAPS
30.0000 mg | ORAL_CAPSULE | Freq: Every morning | ORAL | 0 refills | Status: AC
  Filled 2024-03-15: qty 30, 30d supply, fill #0

## 2024-03-15 MED ORDER — LISDEXAMFETAMINE DIMESYLATE 30 MG PO CAPS: 30.0000 mg | ORAL_CAPSULE | Freq: Every morning | ORAL | 0 refills | Status: AC

## 2024-03-16 ENCOUNTER — Other Ambulatory Visit (HOSPITAL_BASED_OUTPATIENT_CLINIC_OR_DEPARTMENT_OTHER): Payer: Self-pay

## 2024-03-16 ENCOUNTER — Other Ambulatory Visit (HOSPITAL_COMMUNITY): Payer: Self-pay

## 2024-03-20 ENCOUNTER — Other Ambulatory Visit: Payer: Self-pay | Admitting: Family Medicine

## 2024-03-20 ENCOUNTER — Telehealth: Payer: Self-pay

## 2024-03-28 ENCOUNTER — Ambulatory Visit
Admission: RE | Admit: 2024-03-28 | Discharge: 2024-03-28 | Disposition: A | Discharge status: Home / Self Care | Source: Ambulatory Visit | Attending: Family Medicine | Admitting: Family Medicine

## 2024-03-28 DIAGNOSIS — R519 Headache, unspecified: Secondary | ICD-10-CM

## 2024-03-28 MED ORDER — GADOPICLENOL 0.5 MMOL/ML IV SOLN
6.0000 mL | Freq: Once | INTRAVENOUS | Status: AC | PRN
  Administered 2024-03-28: 6 mL via INTRAVENOUS

## 2024-03-28 MED ORDER — GADOPICLENOL 0.5 MMOL/ML IV SOLN: 6.0000 mL | Freq: Once | INTRAVENOUS | Status: DC | PRN

## 2024-03-30 ENCOUNTER — Other Ambulatory Visit (HOSPITAL_BASED_OUTPATIENT_CLINIC_OR_DEPARTMENT_OTHER): Payer: Self-pay

## 2024-04-14 DIAGNOSIS — R5383 Other fatigue: Secondary | ICD-10-CM | POA: Diagnosis not present

## 2024-04-14 DIAGNOSIS — N951 Menopausal and female climacteric states: Secondary | ICD-10-CM | POA: Diagnosis not present

## 2024-04-14 DIAGNOSIS — E237 Disorder of pituitary gland, unspecified: Secondary | ICD-10-CM | POA: Diagnosis not present

## 2024-04-14 DIAGNOSIS — R519 Headache, unspecified: Secondary | ICD-10-CM | POA: Diagnosis not present

## 2024-04-17 ENCOUNTER — Other Ambulatory Visit: Payer: Self-pay

## 2024-04-17 ENCOUNTER — Other Ambulatory Visit (HOSPITAL_BASED_OUTPATIENT_CLINIC_OR_DEPARTMENT_OTHER): Payer: Self-pay

## 2024-04-18 ENCOUNTER — Other Ambulatory Visit (HOSPITAL_BASED_OUTPATIENT_CLINIC_OR_DEPARTMENT_OTHER): Payer: Self-pay

## 2024-04-18 DIAGNOSIS — R519 Headache, unspecified: Secondary | ICD-10-CM | POA: Diagnosis not present

## 2024-04-18 DIAGNOSIS — N951 Menopausal and female climacteric states: Secondary | ICD-10-CM | POA: Diagnosis not present

## 2024-04-18 DIAGNOSIS — E237 Disorder of pituitary gland, unspecified: Secondary | ICD-10-CM | POA: Diagnosis not present

## 2024-04-18 DIAGNOSIS — R5383 Other fatigue: Secondary | ICD-10-CM | POA: Diagnosis not present

## 2024-04-24 ENCOUNTER — Other Ambulatory Visit (HOSPITAL_BASED_OUTPATIENT_CLINIC_OR_DEPARTMENT_OTHER): Payer: Self-pay

## 2024-04-24 ENCOUNTER — Other Ambulatory Visit: Payer: Self-pay

## 2024-04-25 ENCOUNTER — Other Ambulatory Visit (HOSPITAL_BASED_OUTPATIENT_CLINIC_OR_DEPARTMENT_OTHER): Payer: Self-pay

## 2024-04-25 ENCOUNTER — Other Ambulatory Visit: Payer: Self-pay

## 2024-06-01 DIAGNOSIS — N951 Menopausal and female climacteric states: Secondary | ICD-10-CM | POA: Diagnosis not present

## 2024-06-01 DIAGNOSIS — D473 Essential (hemorrhagic) thrombocythemia: Secondary | ICD-10-CM | POA: Diagnosis not present

## 2024-06-01 DIAGNOSIS — R519 Headache, unspecified: Secondary | ICD-10-CM | POA: Diagnosis not present

## 2024-06-01 DIAGNOSIS — R5383 Other fatigue: Secondary | ICD-10-CM | POA: Diagnosis not present

## 2024-06-01 DIAGNOSIS — D352 Benign neoplasm of pituitary gland: Secondary | ICD-10-CM | POA: Diagnosis not present

## 2024-06-11 ENCOUNTER — Other Ambulatory Visit (HOSPITAL_BASED_OUTPATIENT_CLINIC_OR_DEPARTMENT_OTHER): Payer: Self-pay

## 2024-06-12 ENCOUNTER — Other Ambulatory Visit: Payer: Self-pay

## 2024-06-12 ENCOUNTER — Other Ambulatory Visit (HOSPITAL_BASED_OUTPATIENT_CLINIC_OR_DEPARTMENT_OTHER): Payer: Self-pay

## 2024-06-12 MED ORDER — LISDEXAMFETAMINE DIMESYLATE 30 MG PO CAPS
30.0000 mg | ORAL_CAPSULE | Freq: Every morning | ORAL | 0 refills | Status: DC
  Filled 2024-06-12: qty 30, 30d supply, fill #0

## 2024-06-13 ENCOUNTER — Other Ambulatory Visit: Payer: Self-pay

## 2024-06-14 ENCOUNTER — Other Ambulatory Visit (HOSPITAL_BASED_OUTPATIENT_CLINIC_OR_DEPARTMENT_OTHER): Payer: Self-pay

## 2024-06-14 MED ORDER — FLUTICASONE PROPIONATE 50 MCG/ACT NA SUSP
2.0000 | Freq: Every day | NASAL | 1 refills | Status: DC
  Filled 2024-06-14: qty 16, 30d supply, fill #0
  Filled 2024-07-10: qty 16, 30d supply, fill #1

## 2024-06-23 ENCOUNTER — Other Ambulatory Visit (HOSPITAL_BASED_OUTPATIENT_CLINIC_OR_DEPARTMENT_OTHER): Payer: Self-pay

## 2024-07-10 ENCOUNTER — Other Ambulatory Visit: Payer: Self-pay

## 2024-07-10 ENCOUNTER — Other Ambulatory Visit (HOSPITAL_BASED_OUTPATIENT_CLINIC_OR_DEPARTMENT_OTHER): Payer: Self-pay

## 2024-07-10 MED ORDER — LISDEXAMFETAMINE DIMESYLATE 30 MG PO CAPS
30.0000 mg | ORAL_CAPSULE | Freq: Every morning | ORAL | 0 refills | Status: AC
  Filled 2024-07-11: qty 30, 30d supply, fill #0

## 2024-07-11 ENCOUNTER — Other Ambulatory Visit (HOSPITAL_COMMUNITY): Payer: Self-pay

## 2024-07-11 ENCOUNTER — Other Ambulatory Visit (HOSPITAL_BASED_OUTPATIENT_CLINIC_OR_DEPARTMENT_OTHER): Payer: Self-pay

## 2024-07-11 ENCOUNTER — Encounter: Payer: Self-pay | Admitting: Pharmacist

## 2024-07-11 ENCOUNTER — Other Ambulatory Visit: Payer: Self-pay

## 2024-07-17 ENCOUNTER — Other Ambulatory Visit (HOSPITAL_COMMUNITY): Payer: Self-pay

## 2024-07-17 DIAGNOSIS — H938X1 Other specified disorders of right ear: Secondary | ICD-10-CM | POA: Diagnosis not present

## 2024-07-17 MED ORDER — PREDNISONE 20 MG PO TABS
ORAL_TABLET | ORAL | 0 refills | Status: AC
  Filled 2024-07-17 – 2024-07-18 (×2): qty 42, 21d supply, fill #0

## 2024-07-18 ENCOUNTER — Other Ambulatory Visit (HOSPITAL_BASED_OUTPATIENT_CLINIC_OR_DEPARTMENT_OTHER): Payer: Self-pay

## 2024-07-18 ENCOUNTER — Other Ambulatory Visit (HOSPITAL_COMMUNITY): Payer: Self-pay

## 2024-08-11 ENCOUNTER — Other Ambulatory Visit (HOSPITAL_BASED_OUTPATIENT_CLINIC_OR_DEPARTMENT_OTHER): Payer: Self-pay

## 2024-08-11 ENCOUNTER — Other Ambulatory Visit (HOSPITAL_COMMUNITY): Payer: Self-pay

## 2024-08-11 MED ORDER — LISDEXAMFETAMINE DIMESYLATE 30 MG PO CAPS
30.0000 mg | ORAL_CAPSULE | Freq: Every day | ORAL | 0 refills | Status: AC
  Filled 2024-08-11: qty 30, 30d supply, fill #0

## 2024-08-12 ENCOUNTER — Other Ambulatory Visit (HOSPITAL_COMMUNITY): Payer: Self-pay

## 2024-08-12 MED ORDER — FLUTICASONE PROPIONATE 50 MCG/ACT NA SUSP
2.0000 | Freq: Every day | NASAL | 0 refills | Status: AC
  Filled 2024-08-12 (×2): qty 16, 30d supply, fill #0

## 2025-02-06 ENCOUNTER — Ambulatory Visit: Admitting: Obstetrics and Gynecology
